# Patient Record
Sex: Male | Born: 1965 | Race: White | Hispanic: No | Marital: Single | Smoking: Former smoker
Health system: Southern US, Community
[De-identification: ages and names within clinical notes are randomized; demographics above are authoritative.]

## PROBLEM LIST (undated history)

## (undated) DIAGNOSIS — F32A Depression, unspecified: Secondary | ICD-10-CM

## (undated) DIAGNOSIS — F1010 Alcohol abuse, uncomplicated: Secondary | ICD-10-CM

## (undated) DIAGNOSIS — F329 Major depressive disorder, single episode, unspecified: Secondary | ICD-10-CM

## (undated) DIAGNOSIS — I4891 Unspecified atrial fibrillation: Secondary | ICD-10-CM

## (undated) DIAGNOSIS — I10 Essential (primary) hypertension: Secondary | ICD-10-CM

## (undated) DIAGNOSIS — I509 Heart failure, unspecified: Secondary | ICD-10-CM

## (undated) DIAGNOSIS — Z720 Tobacco use: Secondary | ICD-10-CM

## (undated) HISTORY — PX: CARDIOVERSION: SHX1299

## (undated) HISTORY — PX: ABLATION: SHX5711

## (undated) HISTORY — DX: Alcohol abuse, uncomplicated: F10.10

## (undated) HISTORY — DX: Major depressive disorder, single episode, unspecified: F32.9

## (undated) HISTORY — DX: Depression, unspecified: F32.A

## (undated) HISTORY — DX: Tobacco use: Z72.0

## (undated) HISTORY — DX: Heart failure, unspecified: I50.9

---

## 2015-07-10 DIAGNOSIS — F32A Depression, unspecified: Secondary | ICD-10-CM | POA: Insufficient documentation

## 2015-07-10 DIAGNOSIS — Z720 Tobacco use: Secondary | ICD-10-CM | POA: Insufficient documentation

## 2015-07-10 DIAGNOSIS — F1010 Alcohol abuse, uncomplicated: Secondary | ICD-10-CM | POA: Insufficient documentation

## 2015-07-15 ENCOUNTER — Emergency Department: Payer: BC Managed Care – PPO

## 2015-07-15 ENCOUNTER — Emergency Department
Admission: EM | Admit: 2015-07-15 | Discharge: 2015-07-15 | Payer: BC Managed Care – PPO | Attending: Emergency Medicine | Admitting: Emergency Medicine

## 2015-07-15 ENCOUNTER — Other Ambulatory Visit: Payer: Self-pay

## 2015-07-15 ENCOUNTER — Encounter: Payer: Self-pay | Admitting: Emergency Medicine

## 2015-07-15 DIAGNOSIS — I9589 Other hypotension: Secondary | ICD-10-CM | POA: Diagnosis not present

## 2015-07-15 DIAGNOSIS — I4891 Unspecified atrial fibrillation: Secondary | ICD-10-CM | POA: Diagnosis not present

## 2015-07-15 DIAGNOSIS — F17200 Nicotine dependence, unspecified, uncomplicated: Secondary | ICD-10-CM | POA: Insufficient documentation

## 2015-07-15 DIAGNOSIS — I313 Pericardial effusion (noninflammatory): Secondary | ICD-10-CM | POA: Insufficient documentation

## 2015-07-15 DIAGNOSIS — Z7902 Long term (current) use of antithrombotics/antiplatelets: Secondary | ICD-10-CM | POA: Insufficient documentation

## 2015-07-15 DIAGNOSIS — R079 Chest pain, unspecified: Secondary | ICD-10-CM | POA: Diagnosis present

## 2015-07-15 DIAGNOSIS — I3139 Other pericardial effusion (noninflammatory): Secondary | ICD-10-CM

## 2015-07-15 DIAGNOSIS — Z79899 Other long term (current) drug therapy: Secondary | ICD-10-CM | POA: Insufficient documentation

## 2015-07-15 HISTORY — DX: Unspecified atrial fibrillation: I48.91

## 2015-07-15 LAB — CBC WITH DIFFERENTIAL/PLATELET
Basophils Absolute: 0 10*3/uL (ref 0–0.1)
Basophils Relative: 0 %
Eosinophils Absolute: 0 10*3/uL (ref 0–0.7)
Eosinophils Relative: 1 %
HCT: 41.8 % (ref 40.0–52.0)
Hemoglobin: 14.1 g/dL (ref 13.0–18.0)
Lymphocytes Relative: 25 %
Lymphs Abs: 1.9 10*3/uL (ref 1.0–3.6)
MCH: 33.1 pg (ref 26.0–34.0)
MCHC: 33.6 g/dL (ref 32.0–36.0)
MCV: 98.5 fL (ref 80.0–100.0)
Monocytes Absolute: 0.6 10*3/uL (ref 0.2–1.0)
Monocytes Relative: 8 %
Neutro Abs: 4.9 10*3/uL (ref 1.4–6.5)
Neutrophils Relative %: 66 %
Platelets: 212 10*3/uL (ref 150–440)
RBC: 4.25 MIL/uL — ABNORMAL LOW (ref 4.40–5.90)
RDW: 14.1 % (ref 11.5–14.5)
WBC: 7.5 10*3/uL (ref 3.8–10.6)

## 2015-07-15 LAB — COMPREHENSIVE METABOLIC PANEL
ALT: 56 U/L (ref 17–63)
AST: 57 U/L — ABNORMAL HIGH (ref 15–41)
Albumin: 3.7 g/dL (ref 3.5–5.0)
Alkaline Phosphatase: 51 U/L (ref 38–126)
Anion gap: 9 (ref 5–15)
BUN: 18 mg/dL (ref 6–20)
CO2: 23 mmol/L (ref 22–32)
Calcium: 8.3 mg/dL — ABNORMAL LOW (ref 8.9–10.3)
Chloride: 104 mmol/L (ref 101–111)
Creatinine, Ser: 1.57 mg/dL — ABNORMAL HIGH (ref 0.61–1.24)
GFR calc Af Amer: 59 mL/min — ABNORMAL LOW (ref >60)
GFR calc non Af Amer: 51 mL/min — ABNORMAL LOW (ref >60)
Glucose, Bld: 137 mg/dL — ABNORMAL HIGH (ref 65–99)
Potassium: 4.6 mmol/L (ref 3.5–5.1)
Sodium: 136 mmol/L (ref 135–145)
Total Bilirubin: 0.6 mg/dL (ref 0.3–1.2)
Total Protein: 6.8 g/dL (ref 6.5–8.1)

## 2015-07-15 LAB — TROPONIN I: Troponin I: <0.03 ng/mL (ref <0.031)

## 2015-07-15 MED ORDER — SODIUM CHLORIDE 0.9 % IV BOLUS (SEPSIS)
1000.0000 mL | Freq: Once | INTRAVENOUS | Status: AC
  Administered 2015-07-15: 1000 mL via INTRAVENOUS

## 2015-07-15 MED ORDER — NOREPINEPHRINE BITARTRATE 1 MG/ML IV SOLN
0.0000 ug/min | Freq: Once | INTRAVENOUS | Status: DC
  Filled 2015-07-15: qty 4

## 2015-07-15 NOTE — ED Notes (Addendum)
Pt states 'i'm having a fib". Pt diaphoretic pale. Pt denies pain, pt states was seen at [facility] in [place] on Monday and diagnosed with a fib and "high heart enzymes". resps unlabored.

## 2015-07-15 NOTE — ED Notes (Addendum)
Spoke with vera from [facility]; no air travel due to the weather. Room [place] tower 7th floor assigned; will call nurse's station with report. Critical care transport will call when en route.  Pt placed on 2L O2 per Dr. Cinda Quest.

## 2015-07-15 NOTE — ED Provider Notes (Signed)
-----------------------------------------   10:28 AM on 07/15/2015 -----------------------------------------  I accepted signout from Dr. Rip Harbour with patient already accepted at [facility] and transportation in route. Patient has remained stable in the emergency department. There were concerns that he may need to be started on levo phed. His blood pressure has improved little bit and he has been stable and this medication was not started. At this time, I reviewed the case with the transport nurse from [facility] reviewed his chest x-ray as well, which shows a slightly round-appearing cardiacs follow-up.  Ahmed Prima, MD 07/15/15 [phone]

## 2015-07-15 NOTE — ED Provider Notes (Addendum)
[facility] Emergency Department Provider Note  ____________________________________________  Time seen: Approximately 7:51 AM  I have reviewed the triage vital signs and the nursing notes.   HISTORY  Chief Complaint Atrial Fibrillation    HPI Derrick Murphy is a 49 y.o. male who reports that last week he had atrial fibrillation new onset he was seen at [facility] he was defibrillated and then put on Eliquis. He had a bump in his troponin and because of the crushing chest pain he had experienced it was thought he might have had a small heart attack he said he was told. He was discharged from there and felt well until this morning he had an episode of feeling pale cold and sweaty and lightheaded. He came into the emergency room. In the emergency room EKG showed a flutter with ventricular rate of about 60 or 61. EKG was reviewed by myself and the other ER doctor on duty we did not feel there was any acute MI at that time. Second EKG was done which showed no acute MI. Patient's blood pressure remained low. Patient denied headache nausea vomiting diarrhea chest pain belly pain dysuria or any other complaints nothing was hurting him. He just felt lightheaded and very weak. Patient received IV fluids for his hypotension blood pressure remained in the 80s dropping occasionally down into the 70s and sometimes going up to the 90s. Chest x-ray looked normal except for cardiomegaly electrolytes came back looking normal white blood count was normal he is not anemic. His troponin is negative. Bedside ultrasound with her sinusitis in the ER shows a pericardial effusion. I looked at this bedside echo with her sinusitis Dr. Thomasene Lot. Both of [place] are suspicious that the right ventricle may not be expanding properly. This would imply that it Cannot may be developing. Neither one of [place] her experts in this though. I spoke with Dr. Vincente Poli cardiologist who advised transfer. I then spoke  to the cardiologist at [facility] has recommended I speak to the status I was not sure that this was temp not. The hospitalist referred me to the intensivist. The intensivist said that she get the cardiologist here or the intensivist here to see him negative new or one of them are in the hospital at that time period and the patient is still hypotensive in the 70s. I have called [facility] and they have accepted the patient.  Past Medical History  Diagnosis Date  . Atrial fibrillation ([place])     There are no active problems to display for this patient.   History reviewed. No pertinent past surgical history.  Current Outpatient Rx  Name  Route  Sig  Dispense  Refill  . ALPRAZolam (XANAX) 0.5 MG tablet   Oral   Take 0.5 mg by mouth 2 (two) times daily as needed for anxiety.         Marland Kitchen amiodarone (PACERONE) 400 MG tablet   Oral   Take 400 mg by mouth 2 (two) times daily.         Marland Kitchen apixaban (ELIQUIS) 5 MG TABS tablet   Oral   Take 5 mg by mouth 2 (two) times daily.         . magnesium oxide (MAG-OX) 400 MG tablet   Oral   Take 400 mg by mouth daily.         . metoprolol succinate (TOPROL-XL) 50 MG 24 hr tablet   Oral   Take 50 mg by mouth daily. Take with  or immediately following a meal.         . tamsulosin (FLOMAX) 0.4 MG CAPS capsule   Oral   Take 0.4 mg by mouth.         . venlafaxine XR (EFFEXOR-XR) 150 MG 24 hr capsule   Oral   Take 150 mg by mouth daily with breakfast.           Allergies Review of patient's allergies indicates no known allergies.  History reviewed. No pertinent family history.  Social History Social History  Substance Use Topics  . Smoking status: Current Every Day Smoker  . Smokeless tobacco: None  . Alcohol Use: Yes    Review of Systems Constitutional: No fever/chills Eyes: No visual changes. ENT: No sore throat. Cardiovascular: Denies chest pain. Respiratory: Denies shortness of breath. Gastrointestinal: No abdominal pain.  No  nausea, no vomiting.  No diarrhea.  No constipation. Genitourinary: Negative for dysuria. Musculoskeletal: Negative for back pain. Skin: Negative for rash. Neurological: Negative for headaches, focal weakness or numbness.  10-point ROS otherwise negative.  ____________________________________________   PHYSICAL EXAM:  VITAL SIGNS: ED Triage Vitals  Enc Vitals Group     BP 07/15/15 0607 88/49 mmHg     Pulse Rate 07/15/15 0604 60     Resp 07/15/15 0604 14     Temp 07/15/15 0604 98 F (36.7 C)     Temp Source 07/15/15 0604 Oral     SpO2 07/15/15 0604 98 %     Weight 07/15/15 0604 240 lb (108.863 kg)     Height 07/15/15 0604 5\' 8"  (1.727 m)     Head Cir --      Peak Flow --      Pain Score --      Pain Loc --      Pain Edu? --      Excl. in [place]? --     Constitutional: Alert and oriented. Pale and sweaty gentleman Eyes: Conjunctivae are normal. PERRL. EOMI. Head: Atraumatic. Nose: No congestion/rhinnorhea. Mouth/Throat: Mucous membranes are moist.  Oropharynx non-erythematous. Neck: No stridor.  Cardiovascular: Normal rate, regular rhythm. Grossly normal heart sounds.  Good peripheral circulation. Respiratory: Normal respiratory effort.  No retractions. Lungs CTAB. Gastrointestinal: Soft and nontender. No distention. No abdominal bruits. No CVA tenderness. Musculoskeletal: No lower extremity tenderness nor edema.  No joint effusions. Neurologic:  Normal speech and language. No gross focal neurologic deficits are appreciated. No gait instability. Skin:  Skin is pale and sweaty. No rash noted. Psychiatric: Mood and affect are normal. Speech and behavior are normal. Patient says he feels fine and wants to go out and eat  ____________________________________________   LABS (all labs ordered are listed, but only abnormal results are displayed)  Labs Reviewed  CBC WITH DIFFERENTIAL/PLATELET - Abnormal; Notable for the following:    RBC 4.25 (*)    All other components  within normal limits  COMPREHENSIVE METABOLIC PANEL - Abnormal; Notable for the following:    Glucose, Bld 137 (*)    Creatinine, Ser 1.57 (*)    Calcium 8.3 (*)    AST 57 (*)    GFR calc non Af Amer 51 (*)    GFR calc Af Amer 59 (*)    All other components within normal limits  TROPONIN I   ____________________________________________  EKG  EKG shows a ventricular rate of 61 with a flutter about a 4-1 conduction rate normal axis no obvious acute ST-T wave changes although the a flutter is so rapid it's  difficult to tell EKG #2 also read and interpreted by me shows a flutter with a 4-1 block the machine is reading a left bundle branch block I do not believe he has left bundle branch block although the QRS is widened slightly I do not see any acute ST-T wave changes by 8:00 patient is feeling well and again wants to go eat. His episode of pale and sweatiness only lasted for about 15 minutes. Checked our intensivist is not in-house I nor cardiologist on-call is not in house [facility] doesn't have any beds at this point I'm going to talk to the ER doctor and see if I can have him transferred to the ER [facility] ____________________________________________  RADIOLOGY  Chest x-ray reviewed by me and read by radiologist shows cardiomegaly ____________________________________________   PROCEDURES Blood pressure checked in both arms and with a manual cuff it is consistent in all 3 ways of treating the blood pressure.  ----------------------------------------- 8:48 AM on 07/15/2015 -----------------------------------------  At this time [facility] is trying to make a bed and call me back or send the patient to the ER I have also talked to [facility] also accepted him and they're trying to find a way to get him there and an expeditious manner. Most coming back and tell me they can fly this gentleman this gentleman was blood pressure has now gone up to 92/70 she is the highest it ever been since he's been here  today. The care everywhere has appeared and there is an echo from last week which shows no effusion continue administering fluids patient's had to hold liters and is working on the next 2 L about halfway through with the third liter and the fourth liters just starting at this point ____________________________________________   [place] / [place] / ED COURSE  Pertinent labs & imaging results that were available during my care of the patient were reviewed by me and considered in my medical decision making (see chart for details).   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  ----------------------------------------- 9:04 AM on 07/15/2015 -----------------------------------------  Patient at this point has gotten proximally 3 L of fluid. Blood pressures up to 98 systolic. We will keep the epinephrine on standby. We had not started it yet  ----------------------------------------- 9:32 AM on 07/15/2015 -----------------------------------------  [facility] is called they are approximately an hour out. Patient's blood pressure now that the fourth and fifth liters have been finished for maybe 15 minutes is 88 systolic. Patient is sleeping when he sleeps O2 sats fall to 88. When he wakes up when I talk to his sats go up to 98 again rapidly. He does not appear to be in any distress. He does not have any symptoms of congestive failure. I will start 2 more liters of fluid and run them slightly more slowly in an attempt to avoid giving levothyroid just in case he levothyroid will worsen his perfusion by raising his afterload. The levothyroid however is in the room  and if needed we will started 0.1 mics per kilo per minute and go up from there. Dr. Thomasene Lot has been here with me since 8 AM and is fully aware the patient. He will monitor the patient such time  as [facility] arrives. I will go home now so as not to fall asleep while driving home.  Nena Polio, MD 07/15/15 [phone]  Critical care time is 2 hours this includes talking to 2 of our ER doctors Dr. Vincente Poli cardiologist the [facility] Pontes cardiologist the [facility] list the Martyn Malay critical care doctor the [facility] transfer center and critical care doctor in the [facility] Transfer [facility]., [facility] fellow and attending for cardiology and several other calls to the [place] transfer center as well as the [facility] transfer center and repeated updates to the patient and his mother in the room.  Nena Polio, MD 07/15/15 [phone]  Please note the dictation of the history and physical in the last 2 sense that should say the bedside ultrasound showed a pericardial effusion. Dr. Thomasene Lot and I felt that the right ventricle was not expanding properly. This would seem to indicate a developing pericardial tamponade. I called the cardiologist on call Dr. Fletcher Anon. He advised transfer. I spoke to the cardiologist at [facility]. He said since I wasn't sure he would recommend transferring the patient to the hospitalist. Spoke to the hospitalist. The hospitalist said the patient sounded sick enough to talk to the intensivist. Ice called the intensivist. Intensivist said since I wasn't sure I should probably get our intensivist to see the patient. Neither are intensivist nor R cardiologist are in-house at this point. They will not be here for some time. I therefore called [facility] the patient was accepted there immediately. Unfortunately [facility] had no beds. Therefore I called [facility]. The patient was accepted there immediately as well.  Nena Polio, MD 07/15/15 1017  Lee's note this note was dictated on Dragon. There are many Dragon  mistakes and it please excuse them thank you  Nena Polio, MD 07/15/15 1019

## 2015-07-15 NOTE — ED Notes (Signed)
Pt given urinal; wanted to stand up but was convinced to stay in bed.

## 2015-07-16 DIAGNOSIS — I483 Typical atrial flutter: Secondary | ICD-10-CM | POA: Insufficient documentation

## 2015-08-28 ENCOUNTER — Emergency Department (HOSPITAL_COMMUNITY)
Admission: EM | Admit: 2015-08-28 | Discharge: 2015-08-28 | Disposition: A | Discharge status: Home / Self Care | Payer: BC Managed Care – PPO | Attending: Emergency Medicine | Admitting: Emergency Medicine

## 2015-08-28 DIAGNOSIS — F17200 Nicotine dependence, unspecified, uncomplicated: Secondary | ICD-10-CM | POA: Diagnosis not present

## 2015-08-28 DIAGNOSIS — R110 Nausea: Secondary | ICD-10-CM | POA: Diagnosis not present

## 2015-08-28 DIAGNOSIS — I4891 Unspecified atrial fibrillation: Secondary | ICD-10-CM | POA: Diagnosis not present

## 2015-08-28 DIAGNOSIS — Z79899 Other long term (current) drug therapy: Secondary | ICD-10-CM | POA: Insufficient documentation

## 2015-08-28 DIAGNOSIS — Z7901 Long term (current) use of anticoagulants: Secondary | ICD-10-CM | POA: Diagnosis not present

## 2015-08-28 DIAGNOSIS — R42 Dizziness and giddiness: Secondary | ICD-10-CM | POA: Diagnosis not present

## 2015-08-28 DIAGNOSIS — R55 Syncope and collapse: Secondary | ICD-10-CM | POA: Diagnosis present

## 2015-08-28 LAB — I-STAT CHEM 8, ED
BUN: 18 mg/dL (ref 6–20)
Calcium, Ion: 1.06 mmol/L — ABNORMAL LOW (ref 1.12–1.23)
Chloride: 100 mmol/L — ABNORMAL LOW (ref 101–111)
Creatinine, Ser: 1.3 mg/dL — ABNORMAL HIGH (ref 0.61–1.24)
Glucose, Bld: 93 mg/dL (ref 65–99)
HCT: 48 % (ref 39.0–52.0)
Hemoglobin: 16.3 g/dL (ref 13.0–17.0)
Potassium: 4.3 mmol/L (ref 3.5–5.1)
Sodium: 139 mmol/L (ref 135–145)
TCO2: 25 mmol/L (ref 0–100)

## 2015-08-28 NOTE — ED Provider Notes (Signed)
CSN: [id number]     Arrival date & time 08/28/15  1440 History   First MD Initiated Contact with Patient 08/28/15 1505     Chief Complaint  Patient presents with  . Near Syncope   (Consider location/radiation/quality/duration/timing/severity/associated sxs/prior Treatment) HPI 50 y.o. male with a hx of Afib/Aflutter, presents to the Emergency Department today complaining of a near syncopal episode this morning. Pt was at [facility] for detox when he started to stand up from a seated position. At that moment, the patient felt light headed/dizzy with nausea. Decided to sit down and the symptoms passed within 15-20 seconds. The detox facility notified EMS and wished him to be cleared medically before being returned. Pt states that he has been seen at both [facility] and [facility] for his Afib/Aflutter as well as previous near-syncope work-ups. Has been cardioverted x3, but were all unsuccessful. Pt is on Elquis as well as Amiodarone, Metoprolol and Magnesium. He was scheduled to see Dr. Anson Fret in [place] for possible ablation on Feb 8th, but the patient was in Detox and they decided to reschedule for March 8th. Pt exhibiting no other symptoms of CP/SOB/ABD pain. No N/V/D. No headaches/vision changes. No other symptoms noted.  Past Medical History  Diagnosis Date  . Atrial fibrillation ([place])    No past surgical history on file. No family history on file. Social History  Substance Use Topics  . Smoking status: Current Every Day Smoker  . Smokeless tobacco: Not on file  . Alcohol Use: Yes    Review of Systems ROS reviewed and all are negative for acute change except as noted in the HPI.  Allergies  Review of patient's allergies indicates no known allergies.  Home Medications   Prior to Admission medications   Medication Sig Start Date End Date Taking? Authorizing Provider  ALPRAZolam Duanne Moron) 0.5 MG tablet Take 0.5 mg by mouth 2 (two) times daily as needed for anxiety.   Yes  Historical Provider, MD  amiodarone (PACERONE) 400 MG tablet Take 400 mg by mouth 2 (two) times daily.   Yes Historical Provider, MD  apixaban (ELIQUIS) 5 MG TABS tablet Take 5 mg by mouth 2 (two) times daily.   Yes Historical Provider, MD  CHLORDIAZEPOXIDE HCL PO Take 1 Dose by mouth once.   Yes Historical Provider, MD  magnesium oxide (MAG-OX) 400 MG tablet Take 400 mg by mouth 2 (two) times daily.    Yes Historical Provider, MD  metoprolol (LOPRESSOR) 50 MG tablet Take 50 mg by mouth 2 (two) times daily.   Yes Historical Provider, MD  Multiple Vitamin (MULTIVITAMIN WITH MINERALS) TABS tablet Take 1 tablet by mouth daily.   Yes Historical Provider, MD  tamsulosin (FLOMAX) 0.4 MG CAPS capsule Take 0.4 mg by mouth.   Yes Historical Provider, MD  thiamine 100 MG tablet Take 100 mg by mouth daily.   Yes Historical Provider, MD  traZODone (DESYREL) 50 MG tablet Take 50 mg by mouth at bedtime as needed for sleep.   Yes Historical Provider, MD  venlafaxine XR (EFFEXOR-XR) 150 MG 24 hr capsule Take 150 mg by mouth daily with breakfast.   Yes Historical Provider, MD   BP 127/79 mmHg  Pulse 76  Temp(Src) 98.5 F (36.9 C) (Oral)  Resp 15  Ht 5\' 8"  (1.727 m)  Wt 117.028 kg  BMI 39.24 kg/m2  SpO2 96%   Physical Exam  Constitutional: He is oriented to person, place, and time. He appears well-developed and well-nourished.  HENT:  Head:  Normocephalic and atraumatic.  Eyes: EOM are normal.  Neck: Normal range of motion. Neck supple.  Cardiovascular: Normal rate, regular rhythm and normal heart sounds.   Pulmonary/Chest: Effort normal and breath sounds normal.  Abdominal: Soft.  Musculoskeletal: Normal range of motion.  Neurological: He is alert and oriented to person, place, and time.  Skin: Skin is warm and dry.  Psychiatric: He has a normal mood and affect. His behavior is normal. Thought content normal.  Nursing note and vitals reviewed.  ED Course  Procedures (including critical care  time) Labs Review Labs Reviewed  I-STAT CHEM 8, ED - Abnormal; Notable for the following:    Chloride 100 (*)    Creatinine, Ser 1.30 (*)    Calcium, Ion 1.06 (*)    All other components within normal limits    Imaging Review No results found. I have personally reviewed and evaluated these images and lab results as part of my medical decision-making.   EKG Interpretation   Date/Time:  Monday August 28 2015 14:41:40 EST Ventricular Rate:  79 PR Interval:    QRS Duration: 127 QT Interval:  439 QTC Calculation: 503 R Axis:   3 Text Interpretation:  Atrial fibrillation v atrial flutter Nonspecific  intraventricular conduction delay No significant change since last tracing  Confirmed by [facility]  MD, MARTHA [id number]) on 08/28/2015 3:13:19 PM      MDM  I have reviewed relevant laboratory values.I have reviewed relevant imaging studies.I personally interpreted the relevant EKG.I have reviewed the relevant previous healthcare records.I have reviewed EMS Documentation.I obtained HPI from historian. Patient discussed with supervising physician  ED Course:  Assessment: 33y Afib/Aflutter presents to ED after near syncopal episode at [facility]. Pt has been seen and "worked up" at [facility] and [facility] for similar episodes. Scheduled to see Cardiologist on March 8th for possible ablation. Pt is well appearing. In NAD. VSS. He is on Eliquis. Is Afib is w/o RVR. Last seen by Cardiologist at [facility] on 08-11-15. Will check iStat Chem 8 for electrolyte abnormalities as well as EKG. If unremarkable, will DC with follow up to Cardiologist for possible ablation. I spoke with [facility] and they had DCed the patient due to his medical instability and will not take him back. Patient is in no acute distress. Vital Signs are stable. Patient is able to ambulate. Patient able to tolerate PO.    Disposition/Plan:  DC Home Additional Verbal discharge instructions given and discussed  with patient.  Pt Instructed to f/u with scheduled appointment with Cardiologist  Return precautions given Pt acknowledges and agrees with plan  Supervising Physician Alfonzo Beers, MD   Final diagnoses:  Dizziness     Shary Decamp, PA-C 08/28/15 [place], MD 08/29/15 [phone]

## 2015-08-28 NOTE — ED Notes (Signed)
Patient had a near syncopal episode this AM. States that it happened at 10 AM.  Denies fall, states that he sat in a chair.  Patient from [facility].

## 2015-08-28 NOTE — Discharge Instructions (Signed)
Please read and follow all provided instructions.  Your diagnoses today include:  1. Dizziness     Tests performed today include:  Vital signs. See below for your results today.   Medications prescribed:   None   Home care instructions:  Follow any educational materials contained in this packet.  Follow-up instructions: Please follow-up with your Cardiologist for possible Ablation.    Return instructions:   Please return to the Emergency Department if you do not get better, if you get worse, or new symptoms OR  - Fever (temperature greater than 101.44F)  - Bleeding that does not stop with holding pressure to the area    -Severe pain (please note that you may be more sore the day after your accident)  - Chest Pain  - Difficulty breathing  - Severe nausea or vomiting  - Inability to tolerate food and liquids  - Passing out  - Skin becoming red around your wounds  - Change in mental status (confusion or lethargy)  - New numbness or weakness     Please return if you have any other emergent concerns.  Additional Information:  Your vital signs today were: BP 136/83 mmHg   Pulse 69   Temp(Src) 98.5 F (36.9 C) (Oral)   Resp 14   Ht 5\' 8"  (1.727 m)   Wt 117.028 kg   BMI 39.24 kg/m2   SpO2 97% If your blood pressure (BP) was elevated above 135/85 this visit, please have this repeated by your doctor within one month. ---------------

## 2015-08-28 NOTE — Progress Notes (Signed)
CSW provided pt with taxi voucher transportation to his mother's home in [place].  Pt encouraged to contact his insurance company for options re: treatment  facilities that may be able to accept him with his medical condition.  Pt appreciative of CSW assistance with transportation.

## 2015-10-19 DIAGNOSIS — R002 Palpitations: Secondary | ICD-10-CM | POA: Insufficient documentation

## 2015-10-20 ENCOUNTER — Emergency Department
Admission: EM | Admit: 2015-10-20 | Discharge: 2015-10-20 | Disposition: A | Discharge status: Home / Self Care | Payer: BC Managed Care – PPO | Attending: Emergency Medicine | Admitting: Emergency Medicine

## 2015-10-20 ENCOUNTER — Emergency Department: Payer: BC Managed Care – PPO

## 2015-10-20 ENCOUNTER — Encounter: Payer: Self-pay | Admitting: Emergency Medicine

## 2015-10-20 DIAGNOSIS — I499 Cardiac arrhythmia, unspecified: Secondary | ICD-10-CM | POA: Insufficient documentation

## 2015-10-20 DIAGNOSIS — S0990XA Unspecified injury of head, initial encounter: Secondary | ICD-10-CM | POA: Insufficient documentation

## 2015-10-20 DIAGNOSIS — Y9289 Other specified places as the place of occurrence of the external cause: Secondary | ICD-10-CM | POA: Insufficient documentation

## 2015-10-20 DIAGNOSIS — F17200 Nicotine dependence, unspecified, uncomplicated: Secondary | ICD-10-CM | POA: Insufficient documentation

## 2015-10-20 DIAGNOSIS — Y9389 Activity, other specified: Secondary | ICD-10-CM | POA: Diagnosis not present

## 2015-10-20 DIAGNOSIS — S01111A Laceration without foreign body of right eyelid and periocular area, initial encounter: Secondary | ICD-10-CM | POA: Diagnosis not present

## 2015-10-20 DIAGNOSIS — S301XXA Contusion of abdominal wall, initial encounter: Secondary | ICD-10-CM | POA: Insufficient documentation

## 2015-10-20 DIAGNOSIS — R55 Syncope and collapse: Secondary | ICD-10-CM | POA: Insufficient documentation

## 2015-10-20 DIAGNOSIS — F1310 Sedative, hypnotic or anxiolytic abuse, uncomplicated: Secondary | ICD-10-CM | POA: Insufficient documentation

## 2015-10-20 DIAGNOSIS — F1010 Alcohol abuse, uncomplicated: Secondary | ICD-10-CM | POA: Diagnosis not present

## 2015-10-20 DIAGNOSIS — W1839XA Other fall on same level, initial encounter: Secondary | ICD-10-CM | POA: Insufficient documentation

## 2015-10-20 DIAGNOSIS — IMO0002 Reserved for concepts with insufficient information to code with codable children: Secondary | ICD-10-CM

## 2015-10-20 DIAGNOSIS — Y998 Other external cause status: Secondary | ICD-10-CM | POA: Insufficient documentation

## 2015-10-20 DIAGNOSIS — S20211A Contusion of right front wall of thorax, initial encounter: Secondary | ICD-10-CM | POA: Diagnosis not present

## 2015-10-20 LAB — COMPREHENSIVE METABOLIC PANEL
ALT: 40 U/L (ref 17–63)
AST: 45 U/L — ABNORMAL HIGH (ref 15–41)
Albumin: 3.9 g/dL (ref 3.5–5.0)
Alkaline Phosphatase: 58 U/L (ref 38–126)
Anion gap: 10 (ref 5–15)
BUN: 14 mg/dL (ref 6–20)
CO2: 22 mmol/L (ref 22–32)
Calcium: 7.5 mg/dL — ABNORMAL LOW (ref 8.9–10.3)
Chloride: 103 mmol/L (ref 101–111)
Creatinine, Ser: 1.06 mg/dL (ref 0.61–1.24)
GFR calc Af Amer: >60 mL/min (ref >60)
GFR calc non Af Amer: >60 mL/min (ref >60)
Glucose, Bld: 100 mg/dL — ABNORMAL HIGH (ref 65–99)
Potassium: 3.9 mmol/L (ref 3.5–5.1)
Sodium: 135 mmol/L (ref 135–145)
Total Bilirubin: 0.5 mg/dL (ref 0.3–1.2)
Total Protein: 6.7 g/dL (ref 6.5–8.1)

## 2015-10-20 LAB — URINALYSIS COMPLETE WITH MICROSCOPIC (ARMC ONLY)
Bacteria, UA: NONE SEEN
Bilirubin Urine: NEGATIVE
Glucose, UA: NEGATIVE mg/dL
Hgb urine dipstick: NEGATIVE
Ketones, ur: NEGATIVE mg/dL
Leukocytes, UA: NEGATIVE
Nitrite: NEGATIVE
Protein, ur: NEGATIVE mg/dL
RBC / HPF: NONE SEEN RBC/hpf (ref 0–5)
Specific Gravity, Urine: 1.01 (ref 1.005–1.030)
pH: 5 (ref 5.0–8.0)

## 2015-10-20 LAB — URINE DRUG SCREEN, QUALITATIVE (ARMC ONLY)
Amphetamines, Ur Screen: NOT DETECTED
Barbiturates, Ur Screen: NOT DETECTED
Benzodiazepine, Ur Scrn: POSITIVE — AB
Cannabinoid 50 Ng, Ur ~~LOC~~: NOT DETECTED
Cocaine Metabolite,Ur ~~LOC~~: NOT DETECTED
MDMA (Ecstasy)Ur Screen: NOT DETECTED
Methadone Scn, Ur: NOT DETECTED
Opiate, Ur Screen: NOT DETECTED
Phencyclidine (PCP) Ur S: NOT DETECTED
Tricyclic, Ur Screen: NOT DETECTED

## 2015-10-20 LAB — CBC WITH DIFFERENTIAL/PLATELET
Basophils Absolute: 0.1 10*3/uL (ref 0–0.1)
Basophils Relative: 1 %
Eosinophils Absolute: 0 10*3/uL (ref 0–0.7)
Eosinophils Relative: 1 %
HCT: 39.5 % — ABNORMAL LOW (ref 40.0–52.0)
Hemoglobin: 13.5 g/dL (ref 13.0–18.0)
Lymphocytes Relative: 18 %
Lymphs Abs: 1.2 10*3/uL (ref 1.0–3.6)
MCH: 33.1 pg (ref 26.0–34.0)
MCHC: 34.3 g/dL (ref 32.0–36.0)
MCV: 96.5 fL (ref 80.0–100.0)
Monocytes Absolute: 0.7 10*3/uL (ref 0.2–1.0)
Monocytes Relative: 10 %
Neutro Abs: 4.7 10*3/uL (ref 1.4–6.5)
Neutrophils Relative %: 70 %
Platelets: 186 10*3/uL (ref 150–440)
RBC: 4.09 MIL/uL — ABNORMAL LOW (ref 4.40–5.90)
RDW: 15.4 % — ABNORMAL HIGH (ref 11.5–14.5)
WBC: 6.6 10*3/uL (ref 3.8–10.6)

## 2015-10-20 LAB — PROTIME-INR
INR: 1.23
Prothrombin Time: 15.7 seconds — ABNORMAL HIGH (ref 11.4–15.0)

## 2015-10-20 LAB — ETHANOL: Alcohol, Ethyl (B): 276 mg/dL — ABNORMAL HIGH (ref <5)

## 2015-10-20 LAB — TROPONIN I: Troponin I: <0.03 ng/mL (ref <0.031)

## 2015-10-20 MED ORDER — SODIUM CHLORIDE 0.9 % IV SOLN
1000.0000 mL | Freq: Once | INTRAVENOUS | Status: AC
  Administered 2015-10-20: 1000 mL via INTRAVENOUS

## 2015-10-20 MED ORDER — LIDOCAINE-EPINEPHRINE (PF) 1 %-1:200000 IJ SOLN
INTRAMUSCULAR | Status: AC
  Administered 2015-10-20: 10:00:00
  Filled 2015-10-20: qty 30

## 2015-10-20 NOTE — Discharge Instructions (Signed)
Alcohol Use Disorder Alcohol use disorder is a mental disorder. It is not a one-time incident of heavy drinking. Alcohol use disorder is the excessive and uncontrollable use of alcohol over time that leads to problems with functioning in one or more areas of daily living. People with this disorder risk harming themselves and others when they drink to excess. Alcohol use disorder also can cause other mental disorders, such as mood and anxiety disorders, and serious physical problems. People with alcohol use disorder often misuse other drugs.  Alcohol use disorder is common and widespread. Some people with this disorder drink alcohol to cope with or escape from negative life events. Others drink to relieve chronic pain or symptoms of mental illness. People with a family history of alcohol use disorder are at higher risk of losing control and using alcohol to excess.  Drinking too much alcohol can cause injury, accidents, and health problems. One drink can be too much when you are:  Working.  Pregnant or breastfeeding.  Taking medicines. Ask your doctor.  Driving or planning to drive. SYMPTOMS  Signs and symptoms of alcohol use disorder may include the following:   Consumption ofalcohol inlarger amounts or over a longer period of time than intended.  Multiple unsuccessful attempts to cutdown or control alcohol use.   A great deal of time spent obtaining alcohol, using alcohol, or recovering from the effects of alcohol (hangover).  A strong desire or urge to use alcohol (cravings).   Continued use of alcohol despite problems at work, school, or home because of alcohol use.   Continued use of alcohol despite problems in relationships because of alcohol use.  Continued use of alcohol in situations when it is physically hazardous, such as driving a car.  Continued use of alcohol despite awareness of a physical or psychological problem that is likely related to alcohol use. Physical  problems related to alcohol use can involve the brain, heart, liver, stomach, and intestines. Psychological problems related to alcohol use include intoxication, depression, anxiety, psychosis, delirium, and dementia.   The need for increased amounts of alcohol to achieve the same desired effect, or a decreased effect from the consumption of the same amount of alcohol (tolerance).  Withdrawal symptoms upon reducing or stopping alcohol use, or alcohol use to reduce or avoid withdrawal symptoms. Withdrawal symptoms include:  Racing heart.  Hand tremor.  Difficulty sleeping.  Nausea.  Vomiting.  Hallucinations.  Restlessness.  Seizures. DIAGNOSIS Alcohol use disorder is diagnosed through an assessment by your health care provider. Your health care provider may start by asking three or four questions to screen for excessive or problematic alcohol use. To confirm a diagnosis of alcohol use disorder, at least two symptoms must be present within a 68-month period. The severity of alcohol use disorder depends on the number of symptoms:  Mild--two or three.  Moderate--four or five.  Severe--six or more. Your health care provider may perform a physical exam or use results from lab tests to see if you have physical problems resulting from alcohol use. Your health care provider may refer you to a mental health professional for evaluation. TREATMENT  Some people with alcohol use disorder are able to reduce their alcohol use to low-risk levels. Some people with alcohol use disorder need to quit drinking alcohol. When necessary, mental health professionals with specialized training in substance use treatment can help. Your health care provider can help you decide how severe your alcohol use disorder is and what type of treatment you need.  The following forms of treatment are available:   Detoxification. Detoxification involves the use of prescription medicines to prevent alcohol withdrawal  symptoms in the first week after quitting. This is important for people with a history of symptoms of withdrawal and for heavy drinkers who are likely to have withdrawal symptoms. Alcohol withdrawal can be dangerous and, in severe cases, cause death. Detoxification is usually provided in a hospital or in-patient substance use treatment facility.  Counseling or talk therapy. Talk therapy is provided by substance use treatment counselors. It addresses the reasons people use alcohol and ways to keep them from drinking again. The goals of talk therapy are to help people with alcohol use disorder find healthy activities and ways to cope with life stress, to identify and avoid triggers for alcohol use, and to handle cravings, which can cause relapse.  Medicines.Different medicines can help treat alcohol use disorder through the following actions:  Decrease alcohol cravings.  Decrease the positive reward response felt from alcohol use.  Produce an uncomfortable physical reaction when alcohol is used (aversion therapy).  Support groups. Support groups are run by people who have quit drinking. They provide emotional support, advice, and guidance. These forms of treatment are often combined. Some people with alcohol use disorder benefit from intensive combination treatment provided by specialized substance use treatment centers. Both inpatient and outpatient treatment programs are available.   This information is not intended to replace advice given to you by your health care provider. Make sure you discuss any questions you have with your health care provider.   Document Released: 08/15/2004 Document Revised: 07/29/2014 Document Reviewed: 10/15/2012 Elsevier Interactive Patient Education 2016 [employer].  Near-Syncope Near-syncope (commonly known as near fainting) is sudden weakness, dizziness, or feeling like you might pass out. During an episode of near-syncope, you may also develop pale skin, have  tunnel vision, or feel sick to your stomach (nauseous). Near-syncope may occur when getting up after sitting or while standing for a long time. It is caused by a sudden decrease in blood flow to the brain. This decrease can result from various causes or triggers, most of which are not serious. However, because near-syncope can sometimes be a sign of something serious, a medical evaluation is required. The specific cause is often not determined. HOME CARE INSTRUCTIONS  Monitor your condition for any changes. The following actions may help to alleviate any discomfort you are experiencing:  Have someone stay with you until you feel stable.  Lie down right away and prop your feet up if you start feeling like you might faint. Breathe deeply and steadily. Wait until all the symptoms have passed. Most of these episodes last only a few minutes. You may feel tired for several hours.   Drink enough fluids to keep your urine clear or pale yellow.   If you are taking blood pressure or heart medicine, get up slowly when seated or lying down. Take several minutes to sit and then stand. This can reduce dizziness.  Follow up with your health care provider as directed. SEEK IMMEDIATE MEDICAL CARE IF:   You have a severe headache.   You have unusual pain in the chest, abdomen, or back.   You are bleeding from the mouth or rectum, or you have black or tarry stool.   You have an irregular or very fast heartbeat.   You have repeated fainting or have seizure-like jerking during an episode.   You faint when sitting or lying down.   You have  confusion.   You have difficulty walking.   You have severe weakness.   You have vision problems.  MAKE SURE YOU:   Understand these instructions.  Will watch your condition.  Will get help right away if you are not doing well or get worse.  Facial Laceration  A facial laceration is a cut on the face. These injuries can be painful and cause  bleeding. Lacerations usually heal quickly, but they need special care to reduce scarring. DIAGNOSIS  Your health care provider will take a medical history, ask for details about how the injury occurred, and examine the wound to determine how deep the cut is. TREATMENT  Some facial lacerations may not require closure. Others may not be able to be closed because of an increased risk of infection. The risk of infection and the chance for successful closure will depend on various factors, including the amount of time since the injury occurred. The wound may be cleaned to help prevent infection. If closure is appropriate, pain medicines may be given if needed. Your health care provider will use stitches (sutures), wound glue (adhesive), or skin adhesive strips to repair the laceration. These tools bring the skin edges together to allow for faster healing and a better cosmetic outcome. If needed, you may also be given a tetanus shot. HOME CARE INSTRUCTIONS  Only take over-the-counter or prescription medicines as directed by your health care provider.  Follow your health care provider's instructions for wound care. These instructions will vary depending on the technique used for closing the wound. For Sutures:  Keep the wound clean and dry.   If you were given a bandage (dressing), you should change it at least once a day. Also change the dressing if it becomes wet or dirty, or as directed by your health care provider.   Wash the wound with soap and water 2 times a day. Rinse the wound off with water to remove all soap. Pat the wound dry with a clean towel.   After cleaning, apply a thin layer of the antibiotic ointment recommended by your health care provider. This will help prevent infection and keep the dressing from sticking.   You may shower as usual after the first 24 hours. Do not soak the wound in water until the sutures are removed.   Get your sutures removed as directed by your health  care provider. With facial lacerations, sutures should usually be taken out after 4-5 days to avoid stitch marks.   Wait a few days after your sutures are removed before applying any makeup. For Skin Adhesive Strips:  Keep the wound clean and dry.   Do not get the skin adhesive strips wet. You may bathe carefully, using caution to keep the wound dry.   If the wound gets wet, pat it dry with a clean towel.   Skin adhesive strips will fall off on their own. You may trim the strips as the wound heals. Do not remove skin adhesive strips that are still stuck to the wound. They will fall off in time.  For Wound Adhesive:  You may briefly wet your wound in the shower or bath. Do not soak or scrub the wound. Do not swim. Avoid periods of heavy sweating until the skin adhesive has fallen off on its own. After showering or bathing, gently pat the wound dry with a clean towel.   Do not apply liquid medicine, cream medicine, ointment medicine, or makeup to your wound while the skin adhesive is  in place. This may loosen the film before your wound is healed.   If a dressing is placed over the wound, be careful not to apply tape directly over the skin adhesive. This may cause the adhesive to be pulled off before the wound is healed.   Avoid prolonged exposure to sunlight or tanning lamps while the skin adhesive is in place.  The skin adhesive will usually remain in place for 5-10 days, then naturally fall off the skin. Do not pick at the adhesive film.  After Healing: Once the wound has healed, cover the wound with sunscreen during the day for 1 full year. This can help minimize scarring. Exposure to ultraviolet light in the first year will darken the scar. It can take 1-2 years for the scar to lose its redness and to heal completely.  SEEK MEDICAL CARE IF:  You have a fever. SEEK IMMEDIATE MEDICAL CARE IF:  You have redness, pain, or swelling around the wound.   You see ayellowish-white  fluid (pus) coming from the wound.    This information is not intended to replace advice given to you by your health care provider. Make sure you discuss any questions you have with your health care provider.   Document Released: 08/15/2004 Document Revised: 07/29/2014 Document Reviewed: 02/18/2013 Elsevier Interactive Patient Education [employer].  This information is not intended to replace advice given to you by your health care provider. Make sure you discuss any questions you have with your health care provider.   Document Released: 07/08/2005 Document Revised: 07/13/2013 Document Reviewed: 12/11/2012 Elsevier Interactive Patient Education [employer].

## 2015-10-20 NOTE — ED Provider Notes (Addendum)
[facility] Emergency Department Provider Note     Time seen: ----------------------------------------- 9:02 AM on 10/20/2015 -----------------------------------------    I have reviewed the triage vital signs and the nursing notes.   HISTORY  Chief Complaint Loss of Consciousness    HPI Derrick Murphy is a 50 y.o. male who presents to the ERafter syncopal episode. Patient fell sustaining a laceration above his right eyebrow. He admits to alcohol use last night but denies any this morning. Patient states he also fell last night. Patient reports he has a history of syncope as well as rapid atrial fibrillation and atrial flutter. Patient complains of soreness above his right eyebrow and across his chest.   Past Medical History  Diagnosis Date  . Atrial fibrillation ([place])     There are no active problems to display for this patient.   No past surgical history on file.  Allergies Review of patient's allergies indicates no known allergies.  Social History Social History  Substance Use Topics  . Smoking status: Current Every Day Smoker  . Smokeless tobacco: Not on file  . Alcohol Use: Yes    Review of Systems Constitutional: Negative for fever. Eyes: Negative for visual changes. ENT: Negative for sore throat. Cardiovascular: Negative for chest pain. Respiratory: Negative for shortness of breath. Gastrointestinal: Negative for abdominal pain, vomiting and diarrhea. Genitourinary: Negative for dysuria. Musculoskeletal: Positive for chest wall pain Skin: Positive for laceration, bruising Neurological: Positive for headache  10-point ROS otherwise negative.  ____________________________________________   PHYSICAL EXAM:  VITAL SIGNS: ED Triage Vitals  Enc Vitals Group     BP --      Pulse --      Resp --      Temp --      Temp src --      SpO2 --      Weight --      Height --      Head Cir --      Peak Flow --      Pain Score  --      Pain Loc --      Pain Edu? --      Excl. in [place]? --     Constitutional: Alert and oriented. No distress Eyes: Conjunctivae are normal. PERRL. Normal extraocular movements. ENT   Head: Extensive right periorbital ecchymosis, supraorbital laceration on the right   Nose: No congestion/rhinnorhea.   Mouth/Throat: Mucous membranes are moist.   Neck: No stridor. Cardiovascular: Irregularly irregular rhythm Normal and symmetric distal pulses are present in all extremities. No murmurs, rubs, or gallops. Respiratory: Normal respiratory effort without tachypnea nor retractions. Breath sounds are clear and equal bilaterally. No wheezes/rales/rhonchi. Gastrointestinal: Soft and nontender. No distention. No abdominal bruits.  Musculoskeletal: Nontender with normal range of motion in all extremities.  Neurologic:  Normal speech and language. No gross focal neurologic deficits are appreciated. Speech is normal, he is unstable on his feet. Skin:  2.5 cm laceration as noted above the right eyebrow, ecchymosis over the right periorbital area, right lower chest wall, lower abdomen Psychiatric: Mood and affect are normal.  ____________________________________________  EKG: Interpreted by me. Sinus rhythm with a rate of 70 bpm, Ur screen AV block, normal QRS, long QT interval. Leftward axis.  ____________________________________________  ED COURSE:  Pertinent labs & imaging results that were available during my care of the patient were reviewed by me and considered in my medical decision making (see chart for details). Patient presents after falls at home  and possible syncope. There is also alcohol involved, he will need laceration repair, labs and imaging. LACERATION REPAIR Performed by: Earleen Newport Authorized by: Lenise Arena E Consent: Verbal consent obtained. Risks and benefits: risks, benefits and alternatives were discussed Consent given by: patient Patient  identity confirmed: provided demographic data Prepped and Draped in normal sterile fashion Wound explored  Laceration Location: Right supraorbital area  Laceration Length: 2.5 cm  No Foreign Bodies seen or palpated  Anesthesia: local infiltration  Local anesthetic: lidocaine 1 % with epinephrine  Anesthetic total: I ml  Irrigation method: syringe Amount of cleaning: standard  Skin closure: 5-0 Prolene   Number of sutures: 6   Technique: Running   Patient tolerance: Patient tolerated the procedure well with no immediate complications. ____________________________________________    LABS (pertinent positives/negatives)  Labs Reviewed  CBC WITH DIFFERENTIAL/PLATELET - Abnormal; Notable for the following:    RBC 4.09 (*)    HCT 39.5 (*)    RDW 15.4 (*)    All other components within normal limits  COMPREHENSIVE METABOLIC PANEL - Abnormal; Notable for the following:    Glucose, Bld 100 (*)    Calcium 7.5 (*)    AST 45 (*)    All other components within normal limits  URINALYSIS COMPLETEWITH MICROSCOPIC ([facility] ONLY) - Abnormal; Notable for the following:    Color, Urine YELLOW (*)    APPearance CLEAR (*)    Squamous Epithelial / LPF 0-5 (*)    All other components within normal limits  PROTIME-INR - Abnormal; Notable for the following:    Prothrombin Time 15.7 (*)    All other components within normal limits  ETHANOL - Abnormal; Notable for the following:    Alcohol, Ethyl (B) 276 (*)    All other components within normal limits  URINE DRUG SCREEN, QUALITATIVE ([facility] ONLY) - Abnormal; Notable for the following:    Benzodiazepine, Ur Scrn POSITIVE (*)    All other components within normal limits  TROPONIN I  CBG MONITORING, ED    RADIOLOGY Images were viewed by me  CT head, CXR IMPRESSION: Opacification of a posterolateral right ethmoid air cell. No intracranial mass, hemorrhage, or extra-axial fluid collection. Gray-white compartments appear  normal. IMPRESSION: No active disease.  ____________________________________________  FINAL ASSESSMENT AND PLAN  Fall, Head Injury, near-syncope, Laceration  Plan: Patient with labs and imaging as dictated above. Patient presents with normal vital signs and a normal sinus rhythm. Patient is adamant that the reason for a syncope is atrial fibrillation despite him not being in it on arrival or during his stay. I tried to reiterate that some of his symptoms are likely alcohol-related which she denies despite an alcohol level 276. He denies wanting to go to detox. I offered admission into the hospital to monitor for paroxysmal A. fib. Patient says "hell no, I want you to get this damn thing out of my hand, I want to go home." His wound is been repaired. He is advised to follow-up with his doctor for suture removal in 5 days.   Earleen Newport, MD   Earleen Newport, MD 10/20/15 1051  Earleen Newport, MD 10/20/15 1052  Earleen Newport, MD 10/20/15 [place], MD 10/20/15 1130

## 2015-10-20 NOTE — ED Notes (Signed)
Patient brought in by [facility] from home for a syncopal episode. Patient has laceration above right eyebrow. Patient admits ETOH use last PM but denies ETOH use this AM. Patient states that he also fell last PM.

## 2016-08-12 ENCOUNTER — Ambulatory Visit (INDEPENDENT_AMBULATORY_CARE_PROVIDER_SITE_OTHER): Payer: BLUE CROSS/BLUE SHIELD | Admitting: Family Medicine

## 2016-08-12 ENCOUNTER — Encounter: Payer: Self-pay | Admitting: Family Medicine

## 2016-08-12 VITALS — BP 135/80 | HR 87 | Temp 98.4°F | Resp 14 | Ht 68.0 in | Wt 250.0 lb

## 2016-08-12 DIAGNOSIS — I5022 Chronic systolic (congestive) heart failure: Secondary | ICD-10-CM | POA: Diagnosis not present

## 2016-08-12 DIAGNOSIS — I4891 Unspecified atrial fibrillation: Secondary | ICD-10-CM

## 2016-08-12 DIAGNOSIS — N400 Enlarged prostate without lower urinary tract symptoms: Secondary | ICD-10-CM | POA: Insufficient documentation

## 2016-08-12 DIAGNOSIS — Z87891 Personal history of nicotine dependence: Secondary | ICD-10-CM | POA: Insufficient documentation

## 2016-08-12 DIAGNOSIS — G4733 Obstructive sleep apnea (adult) (pediatric): Secondary | ICD-10-CM | POA: Insufficient documentation

## 2016-08-12 DIAGNOSIS — F1011 Alcohol abuse, in remission: Secondary | ICD-10-CM | POA: Insufficient documentation

## 2016-08-12 DIAGNOSIS — Z87898 Personal history of other specified conditions: Secondary | ICD-10-CM

## 2016-08-12 DIAGNOSIS — Z9989 Dependence on other enabling machines and devices: Secondary | ICD-10-CM

## 2016-08-12 DIAGNOSIS — I10 Essential (primary) hypertension: Secondary | ICD-10-CM

## 2016-08-12 DIAGNOSIS — E669 Obesity, unspecified: Secondary | ICD-10-CM | POA: Diagnosis not present

## 2016-08-12 MED ORDER — LISINOPRIL 10 MG PO TABS: 10.0000 mg | ORAL_TABLET | Freq: Every day | ORAL | 1 refills | Status: DC

## 2016-08-12 MED ORDER — METOPROLOL SUCCINATE ER 25 MG PO TB24: 25.0000 mg | ORAL_TABLET | Freq: Every day | ORAL | 1 refills | Status: DC

## 2016-08-12 MED ORDER — ALPRAZOLAM 0.5 MG PO TABS: 0.5000 mg | ORAL_TABLET | Freq: Two times a day (BID) | ORAL | 0 refills | Status: DC | PRN

## 2016-08-12 MED ORDER — APIXABAN 5 MG PO TABS: 5.0000 mg | ORAL_TABLET | Freq: Two times a day (BID) | ORAL | 1 refills | Status: DC

## 2016-08-12 NOTE — Progress Notes (Signed)
Subjective:  Patient ID: Derrick Murphy, male    DOB: 1966/01/13  Age: 51 y.o. MRN: [id number]  CC: Establish care  HPI Derrick Murphy is a 51 y.o. male presents to the clinic today to establish care.  HTN  Stable on Lisinopril and Metoprolol.  Afib  Long history of Afib.  Previously followed/managed by EP.  Currently on Metoprolol for rate control.  On Eliquis.  Intermittently having palpitations and associated chest pain. Needs cardiology referral.  Systolic CHF  Unsure of status. Last echo was in 2016 and revealed an EF of 25-30%.  Needs to see cards and needs Echo.  Alcohol abuse  Currently drinking 1-2 times a week and only having one drink.  Wants to cut back further and stop.  PMH, Surgical Hx, Family Hx, Social History reviewed and updated as below.  Past Medical History:  Diagnosis Date  . Alcohol abuse   . Atrial fibrillation ([place])   . CHF (congestive heart failure) ([place])   . Depression   . Tobacco abuse    Past Surgical History:  Procedure Laterality Date  . ABLATION    . CARDIOVERSION     Family History  Problem Relation Age of Onset  . Heart disease Mother   . Heart disease Father   . Heart disease Paternal Uncle    Social History  Substance Use Topics  . Smoking status: Former Smoker    Quit date: 06/15/2016  . Smokeless tobacco: Never Used  . Alcohol use 1.2 - 1.8 oz/week    2 - 3 Standard drinks or equivalent per week   Review of Systems  Eyes: Positive for visual disturbance.  Cardiovascular: Positive for chest pain and palpitations.  Genitourinary:       Frequent urination.  Neurological: Positive for dizziness and weakness.  Psychiatric/Behavioral: The patient is nervous/anxious.   All other systems reviewed and are negative.  Objective:   Today's Vitals: BP 135/80   Pulse 87   Temp 98.4 F (36.9 C) (Oral)   Resp 14   Ht 5\' 8"  (1.727 m)   Wt 250 lb (113.4 kg)   SpO2 97%   BMI 38.01 kg/m   Physical Exam    Constitutional: He is oriented to person, place, and time. He appears well-developed. No distress.  HENT:  Head: Normocephalic and atraumatic.  Mouth/Throat: Oropharynx is clear and moist.  Eyes: Conjunctivae are normal.  Neck: Normal range of motion. Neck supple.  Cardiovascular: Normal rate and regular rhythm.   No murmur heard. No LE edema.   Pulmonary/Chest: Effort normal and breath sounds normal.  Abdominal: Soft. He exhibits no distension. There is no tenderness.  Musculoskeletal: Normal range of motion.  Neurological: He is alert and oriented to person, place, and time.  Skin: No rash noted.  Psychiatric:  Flat affect.   Vitals reviewed.  Assessment & Plan:   Problem List Items Addressed This Visit    Obesity (BMI 30-39.9)   Relevant Orders   Hemoglobin A1c   Comprehensive metabolic panel   Lipid panel   TSH   History of alcohol abuse    Patient with minimal alcohol use at this time. Advised cessation.      Chronic systolic CHF (congestive heart failure) (HCC)    Appears stable at this time. Euvolemic on exam. Continue beta blocker and ACE inhibitor. Needs echo. Arranging for cardiology referral.      Relevant Medications   apixaban (ELIQUIS) 5 MG TABS tablet   lisinopril (PRINIVIL,ZESTRIL) 10 MG tablet  metoprolol succinate (TOPROL-XL) 25 MG 24 hr tablet   Benign essential HTN - Primary    Stable currently. Needs labs (will return for these or get at cardiology office; per patient request). Continue Metoprolol and Lisinopril.      Relevant Medications   apixaban (ELIQUIS) 5 MG TABS tablet   lisinopril (PRINIVIL,ZESTRIL) 10 MG tablet   metoprolol succinate (TOPROL-XL) 25 MG 24 hr tablet   Atrial fibrillation (HCC)    Currently in sinus rhythm. Has had several cardioversions and has had ablation. Continue metoprolol. Sending to cardiology.      Relevant Medications   apixaban (ELIQUIS) 5 MG TABS tablet   lisinopril (PRINIVIL,ZESTRIL) 10 MG tablet    metoprolol succinate (TOPROL-XL) 25 MG 24 hr tablet   Other Relevant Orders   CBC   Comprehensive metabolic panel      Outpatient Encounter Prescriptions as of 08/12/2016  Medication Sig  . ALPRAZolam (XANAX) 0.5 MG tablet Take 1 tablet (0.5 mg total) by mouth 2 (two) times daily as needed for anxiety.  Marland Kitchen apixaban (ELIQUIS) 5 MG TABS tablet Take 1 tablet (5 mg total) by mouth 2 (two) times daily.  Marland Kitchen lisinopril (PRINIVIL,ZESTRIL) 10 MG tablet Take 1 tablet (10 mg total) by mouth daily.  . metoprolol succinate (TOPROL-XL) 25 MG 24 hr tablet Take 1 tablet (25 mg total) by mouth daily.  . [DISCONTINUED] ALPRAZolam (XANAX) 0.5 MG tablet Take 0.5 mg by mouth 2 (two) times daily as needed for anxiety.  . [DISCONTINUED] apixaban (ELIQUIS) 5 MG TABS tablet Take 5 mg by mouth 2 (two) times daily.  . [DISCONTINUED] lisinopril (PRINIVIL,ZESTRIL) 10 MG tablet Take 10 mg by mouth.  . [DISCONTINUED] metoprolol succinate (TOPROL-XL) 25 MG 24 hr tablet Take 25 mg by mouth.  . [DISCONTINUED] amiodarone (PACERONE) 400 MG tablet Take 400 mg by mouth 2 (two) times daily.  . [DISCONTINUED] CHLORDIAZEPOXIDE HCL PO Take 1 Dose by mouth once.  . [DISCONTINUED] magnesium oxide (MAG-OX) 400 MG tablet Take 400 mg by mouth 2 (two) times daily.   . [DISCONTINUED] metoprolol (LOPRESSOR) 50 MG tablet Take 50 mg by mouth 2 (two) times daily.  . [DISCONTINUED] Multiple Vitamin (MULTIVITAMIN WITH MINERALS) TABS tablet Take 1 tablet by mouth daily.  . [DISCONTINUED] tamsulosin (FLOMAX) 0.4 MG CAPS capsule Take 0.4 mg by mouth.  . [DISCONTINUED] thiamine 100 MG tablet Take 100 mg by mouth daily.  . [DISCONTINUED] traZODone (DESYREL) 50 MG tablet Take 50 mg by mouth at bedtime as needed for sleep.  . [DISCONTINUED] venlafaxine XR (EFFEXOR-XR) 150 MG 24 hr capsule Take 150 mg by mouth daily with breakfast.   No facility-administered encounter medications on file as of 08/12/2016.     Follow-up: Return in about 3 months  (around 11/10/2016).  [place]

## 2016-08-12 NOTE — Assessment & Plan Note (Signed)
Currently in sinus rhythm. Has had several cardioversions and has had ablation. Continue metoprolol. Sending to cardiology.

## 2016-08-12 NOTE — Patient Instructions (Addendum)
We will set up your cardiology appt.  Return for your labs or get them drawn at the cardiology office.  Follow up with me in 3 months.  Take care  Dr. Lacinda Axon

## 2016-08-12 NOTE — Progress Notes (Signed)
Pre visit review using our clinic review tool, if applicable. No additional management support is needed unless otherwise documented below in the visit note. 

## 2016-08-12 NOTE — Assessment & Plan Note (Signed)
Appears stable at this time. Euvolemic on exam. Continue beta blocker and ACE inhibitor. Needs echo. Arranging for cardiology referral.

## 2016-08-12 NOTE — Assessment & Plan Note (Signed)
Patient with minimal alcohol use at this time. Advised cessation.

## 2016-08-12 NOTE — Assessment & Plan Note (Signed)
Stable currently. Needs labs (will return for these or get at cardiology office; per patient request). Continue Metoprolol and Lisinopril.

## 2016-08-30 ENCOUNTER — Telehealth: Payer: Self-pay | Admitting: Family Medicine

## 2016-08-30 ENCOUNTER — Other Ambulatory Visit: Payer: Self-pay | Admitting: Family Medicine

## 2016-08-30 DIAGNOSIS — I5022 Chronic systolic (congestive) heart failure: Secondary | ICD-10-CM

## 2016-08-30 DIAGNOSIS — I4891 Unspecified atrial fibrillation: Secondary | ICD-10-CM

## 2016-08-30 NOTE — Telephone Encounter (Signed)
Pt called about speaking to Dr Lacinda Axon about wanting to get a referral to see the cardiologist. Referral needed please and thank you!  Call pt @ [phone].

## 2016-08-30 NOTE — Telephone Encounter (Signed)
Recommendations?

## 2016-09-06 ENCOUNTER — Other Ambulatory Visit: Payer: Self-pay | Admitting: Family Medicine

## 2016-09-06 NOTE — Telephone Encounter (Signed)
Refilled 08/12/16. Pt last seen 08/12/16. Please advise?

## 2016-09-06 NOTE — Telephone Encounter (Signed)
Faxed

## 2016-09-10 ENCOUNTER — Ambulatory Visit (INDEPENDENT_AMBULATORY_CARE_PROVIDER_SITE_OTHER): Payer: BLUE CROSS/BLUE SHIELD | Admitting: Cardiovascular Disease

## 2016-09-10 ENCOUNTER — Encounter: Payer: Self-pay | Admitting: Cardiovascular Disease

## 2016-09-10 VITALS — BP 108/62 | HR 109 | Ht 68.0 in | Wt 252.8 lb

## 2016-09-10 DIAGNOSIS — I480 Paroxysmal atrial fibrillation: Secondary | ICD-10-CM

## 2016-09-10 DIAGNOSIS — I5022 Chronic systolic (congestive) heart failure: Secondary | ICD-10-CM | POA: Diagnosis not present

## 2016-09-10 DIAGNOSIS — Z87898 Personal history of other specified conditions: Secondary | ICD-10-CM | POA: Diagnosis not present

## 2016-09-10 DIAGNOSIS — Z9889 Other specified postprocedural states: Secondary | ICD-10-CM

## 2016-09-10 DIAGNOSIS — I10 Essential (primary) hypertension: Secondary | ICD-10-CM

## 2016-09-10 DIAGNOSIS — F1011 Alcohol abuse, in remission: Secondary | ICD-10-CM

## 2016-09-10 DIAGNOSIS — I483 Typical atrial flutter: Secondary | ICD-10-CM

## 2016-09-10 MED ORDER — METOPROLOL SUCCINATE ER 50 MG PO TB24: 50.0000 mg | ORAL_TABLET | Freq: Every day | ORAL | 11 refills | Status: DC

## 2016-09-10 NOTE — Progress Notes (Signed)
Cardiology Office Note  Date:  09/10/2016   ID:  Derrick Murphy, DOB [birth year], MRN [id number]  PCP:  Coral Spikes, DO   Chief Complaint  Patient presents with  . other    Referred by Dr Lacinda Axon, Afib, patient has been seen by  [facility] notes in [place].  Pt c/o of dizziness, occassional chest pain.  Meds reviewed verbally with pt.    HPI:  51 year old With long history of paroxysmal atrial fibrillation, atrial flutter Followed at [facility] last seen June 2017 History of ablation May 2017 presumably for atrial fibrillation though the notes indicate cavo-tricuspid isthmus ablation perhaps indicating flutter ablation Long history of alcohol abuse, obesity, sleep apnea with noncompliance on his CPAP, financial issues, who presents by Referral from Dr. Lacinda Axon for consultation of his atrial fibrillation, flutter  Notes from [facility] indicate On 07/11/15 he presented to an OSH with AF with RVR.  He underwent a cardioversion and was placed on amiodarone.  He was admitted to [facility] on 07/17/15 due to recurrent symptoms after transfer from [facility]. He was dx with atrial flutter at that time. He spontaneously converted. He underwent another cardioversion January 2016 which he states failed.  He was having episodes of syncope and for this reason underwent ablation May 2017 Notes indicating He has a h/o HFrEF with EF of 25-30%.    continues to drink ETOH  ECG from 07/15/15 and 07/16/15 revealed typical atrial flutter with variable conduction  On today's visit he reports having Increased dizziness 4 to 6 weeks ago But otherwise feels well, denies shortness of breath on exertion Heart rate elevated on today's visits, perhaps mildly improved at home but does not check it on a regular basis. Denies any leg edema, abdominal swelling, coughing, orthopnea PND  Other records reviewed on today's visit Echo 07/17/15 The left ventricle is moderately dilated. There is  normal left ventricular wall thickness. Left ventricular systolic function is severely reduced. LV ejection fraction = 25-30%. There is severe global hypokinesis of the left ventricle.  He was scheduled for repeat echocardiogram through EP at [facility] but did not follow through . presumably they wanted ejection fraction with him in normal sinus rhythm    He is asking questions today about qualifications for disability as he does not have a job   EKG on today's visit shows atrial flutter with ventricular rate 109 bpm with nonspecific ST-T wave abnormality    PMH:   has a past medical history of Alcohol abuse; Atrial fibrillation ([place]); CHF (congestive heart failure) ([place]); Depression; and Tobacco abuse. History of atrial flutter, ablation May 2017  PSH:    Past Surgical History:  Procedure Laterality Date  . ABLATION    . CARDIOVERSION      Current Outpatient Prescriptions  Medication Sig Dispense Refill  . ALPRAZolam (XANAX) 0.5 MG tablet TAKE 1 TABLET BY MOUTH TWICE A DAY AS NEEDED FOR ANXIETY 60 tablet 3  . apixaban (ELIQUIS) 5 MG TABS tablet Take 1 tablet (5 mg total) by mouth 2 (two) times daily. 60 tablet 1  . lisinopril (PRINIVIL,ZESTRIL) 10 MG tablet Take 1 tablet (10 mg total) by mouth daily. 30 tablet 1  . metoprolol succinate (TOPROL-XL) 50 MG 24 hr tablet Take 1 tablet (50 mg total) by mouth daily. 30 tablet 11   No current facility-administered medications for this visit.      Allergies:   Patient has no known allergies.   Social History:  The patient  reports that he quit smoking about 2 months ago. He has never used smokeless tobacco. He reports that he drinks about 1.2 - 1.8 oz of alcohol per week . He reports that he does not use drugs.   Family History:   family history includes Heart disease in his father, mother, and paternal uncle.    Review of Systems: Review of Systems  Constitutional: Negative.   Respiratory: Negative.   Cardiovascular: Negative.    Gastrointestinal: Negative.   Musculoskeletal: Negative.   Neurological: Positive for dizziness.  Psychiatric/Behavioral: Negative.   All other systems reviewed and are negative.    PHYSICAL EXAM: VS:  BP 108/62 (BP Location: Right Arm, Patient Position: Sitting, Cuff Size: Normal)   Pulse (!) 109   Ht 5\' 8"  (1.727 m)   Wt 252 lb 12 oz (114.6 kg)   BMI 38.43 kg/m  , BMI Body mass index is 38.43 kg/m. GEN: Well nourished, well developed, in no acute distress , obese HEENT: normal  Neck: no JVD, carotid bruits, or masses Cardiac: Tachycardic, regular rhythm, ; no murmurs, rubs, or gallops,no edema  Respiratory:  clear to auscultation bilaterally, normal work of breathing GI: soft, nontender, nondistended, + BS MS: no deformity or atrophy  Skin: warm and dry, no rash Neuro:  Strength and sensation are intact Psych: euthymic mood, full affect    Recent Labs: 10/20/2015: ALT 40; BUN 14; Creatinine, Ser 1.06; Hemoglobin 13.5; Platelets 186; Potassium 3.9; Sodium 135    Lipid Panel No results found for: CHOL, HDL, LDLCALC, TRIG    Wt Readings from Last 3 Encounters:  09/10/16 252 lb 12 oz (114.6 kg)  08/12/16 250 lb (113.4 kg)  10/20/15 263 lb 6.4 oz (119.5 kg)       ASSESSMENT AND PLAN:  Paroxysmal atrial fibrillation (HCC) Notes indicating previous paroxysmal atrial fibrillation  Chronic systolic CHF (congestive heart failure) ([place]) Appears relatively euvolemic, not requiring Lasix He did not follow-up for echocardiogram at [facility] to reassess ejection fraction after ablation  Benign essential HTN Blood pressure running low, we'll hold the lisinopril to make room possibly from more metoprolol  History of alcohol abuse Recommended alcohol cessation Long-standing history  Typical atrial flutter ([place]) On today's visit appears to be in atrial flutter, previously seen when he was managed at [facility]. He is not interested in cardioversion. He is not  interested in starting antiarrhythmic medications to restore normal sinus rhythm. He is worried about current debt and pain for procedures. Asking about disability. When asked about his symptoms, he reports having occasional lightheadedness but is otherwise relatively asymptomatic and cannot tell me when his atrial flutter recurred as he was previously a normal sinus rhythm in 2017. Finding of atrial flutter today was a surprise to him. Lots of risk factors including obesity, noncompliance with his CPAP, and alcohol use Recommended for his dizziness he hold the lisinopril If rate continues to run high, recommended he increase Toprol up to 25 mg twice a day Suggested he call our office if heart rate continues to run fast   H/O prior ablation treatment Notes indicating cavo-tricuspid isthmus ablation , presumably for flutter    Total encounter time more than 60 minutes  Greater than 50% was spent in counseling and coordination of care with the patient   Disposition:   F/U  6 months  No orders of the defined types were placed in this encounter.    Signed, Esmond Plants, M.D., Ph.D. 09/10/2016  [place],  [place] [phone]

## 2016-09-10 NOTE — Patient Instructions (Addendum)
Medication Instructions:   Consider stopping the lisinopril Increase the metoprolol up to 25 mg twice a day  If rate does not improve, Call the office  [facility], Dr. Catha Nottingham:  No new labs needed  Testing/Procedures:  No further testing at this time   I recommend watching educational videos on topics of interest to you at:       [email or web address].goemmi.com  Enter code: HEARTCARE    Follow-Up: It was a pleasure seeing you in the office today. Please call us if you have new issues that need to be addressed before your next appt.  [phone]  Your physician wants you to follow-up in: 6 months.  You will receive a reminder letter in the mail two months in advance. If you don't receive a letter, please call our office to schedule the follow-up appointment.  If you need a refill on your cardiac medications before your next appointment, please call your pharmacy.

## 2016-09-12 NOTE — Addendum Note (Signed)
Addended by: Janan Ridge on: 09/12/2016 12:20 PM   Modules accepted: Orders

## 2016-10-02 ENCOUNTER — Telehealth: Payer: Self-pay | Admitting: Cardiovascular Disease

## 2016-10-02 NOTE — Telephone Encounter (Signed)
Pt calling stating he was seen a few weeks ago and was calling to give update He has chronic CHF He is taking all the medications  We were a bit concerned about was about  HR 108  So we took out his lisinopril and doubled the metoprolol  Since we did the change  He has not felt right Last night he felt some heart palpitations This morning BP 132/115  This morning HR 156 Right now 121/86 HR 122

## 2016-10-02 NOTE — Telephone Encounter (Signed)
Spoke w/ pt.  Advised him that Dr. Rockey Situ recommends that he call his EP doctor @ [facility] or try to get in w/ [facility]. Pt states that he lives w/ his mother who is 84 and [place]. He asks if he calls EMS, if they will take him to [facility]. Advised pt that if his sx are emergent, they will evaluate him and take him to nearest hospital. He states that he has considerable debt at [facility] and would prefer not to go to a [facility] facility. He asks if he can go to [facility]. Advised him that he may f/u w/ any EP doctor, but recommend that he call [facility], as he has already est care w/ them. He states that he is undecided on how he would like to proceed; he will either resume his previous meds or call [facility] to see if they can see him, though he does not know how he will get there.  He will discuss w/ his mother and call back if we can be of further assistance.  He is appreciative of the call.

## 2016-10-02 NOTE — Telephone Encounter (Signed)
Spoke w/ pt.  He reports that at his last ov on 09/10/16 (1st visit to est care w/ Dr. Rockey Situ), he was advised:  "Consider stopping the lisinopril Increase the metoprolol up to 25 mg twice a day  If rate does not improve, Call the office  [facility], Dr. Omelia Blackwater"  Pt reports that he has stopped the lisinopril and doubled his metoprolol.  Last night, his BP was 132/115, HR 156. He took a Xanax followed by "a big gulp of alcohol" and went to bed. This am, his BP is 121/86, but his HR is still 122.  Pt feels bad and reports that he cannot go anywhere b/c he feels so weak.  Pt states that he has not been able to set up appt w/ [facility] yet. He reports that he has has several cardioversions and an ablation, but nothing can get him back into NSR. He states that he does not know what else can be done and just knows he will have to go to the ED, but he would like to avoid this. Advised him that I will make Dr. Rockey Situ aware of his concerns and call him back w/ his recommendation.

## 2016-10-04 ENCOUNTER — Telehealth: Payer: Self-pay | Admitting: Family Medicine

## 2016-10-04 NOTE — Telephone Encounter (Signed)
Pt called about needing a referral to see the electro specialist in the [place] area. Thank you!  Call pt @ [phone].

## 2016-10-07 ENCOUNTER — Telehealth: Payer: Self-pay | Admitting: Family Medicine

## 2016-10-07 NOTE — Telephone Encounter (Signed)
Pt dropped off Disability forms to be filled out.. Placed in Dr. Geralynn Ochs color folder upfront. Please advise

## 2016-10-07 NOTE — Telephone Encounter (Signed)
Dr. Rockey Situ,  Do you need me to refer him and to whom? Your note mentions someone from [facility]. Is that correct?  [place]

## 2016-10-07 NOTE — Telephone Encounter (Signed)
Paperwork was placed in the red forms folder on Dr.Cooks desk.

## 2016-10-07 NOTE — Telephone Encounter (Signed)
I will need the paperwork and details regarding this.

## 2016-10-07 NOTE — Telephone Encounter (Signed)
Pt called and stated that he is eligible for short term disability. Pt wanted to know if Dr. Lacinda Axon would fill them out or Dr. Rockey Situ. He has tons as medical records from other facilities. Please advise, thank you!  Call pt @ [phone]

## 2016-10-08 ENCOUNTER — Telehealth: Payer: Self-pay | Admitting: Family Medicine

## 2016-10-08 NOTE — Telephone Encounter (Signed)
Left message for pt to call back and schedule an appt.

## 2016-10-08 NOTE — Telephone Encounter (Signed)
Please call pt and have him schedule appt for paperwork.

## 2016-10-08 NOTE — Telephone Encounter (Signed)
Needs an appt to discuss.

## 2016-10-09 NOTE — Telephone Encounter (Signed)
Pt called back returning your call about the medication.   Call pt back @ [phone]. Thank you!

## 2016-10-09 NOTE — Telephone Encounter (Signed)
Medication was refilled.

## 2016-10-09 NOTE — Telephone Encounter (Signed)
No longer on the pts list. Discontinued 08/12/16.

## 2016-10-09 NOTE — Telephone Encounter (Signed)
Thank you! Pt was called.

## 2016-10-09 NOTE — Telephone Encounter (Signed)
He would probably be best if he go back to [facility] where he had previous ablation and electrical management They would know him, his anatomy, could probably see the same physicians

## 2016-10-10 NOTE — Telephone Encounter (Signed)
Please inform him of Dr. Donivan Scull recommendations.

## 2016-10-14 ENCOUNTER — Ambulatory Visit (INDEPENDENT_AMBULATORY_CARE_PROVIDER_SITE_OTHER): Payer: BLUE CROSS/BLUE SHIELD | Admitting: Family Medicine

## 2016-10-14 ENCOUNTER — Encounter: Payer: Self-pay | Admitting: Family Medicine

## 2016-10-14 DIAGNOSIS — I5022 Chronic systolic (congestive) heart failure: Secondary | ICD-10-CM

## 2016-10-14 DIAGNOSIS — I10 Essential (primary) hypertension: Secondary | ICD-10-CM | POA: Diagnosis not present

## 2016-10-14 DIAGNOSIS — I480 Paroxysmal atrial fibrillation: Secondary | ICD-10-CM

## 2016-10-14 NOTE — Progress Notes (Signed)
Pre visit review using our clinic review tool, if applicable. No additional management support is needed unless otherwise documented below in the visit note. 

## 2016-10-14 NOTE — Progress Notes (Signed)
   Subjective:  Patient ID: Derrick Murphy, male    DOB: 23-Jul-1965  Age: 51 y.o. MRN: [id number]  CC: Follow up, fill out disability paperwork  HPI:  51 year old male With recurrent atrial fibrillation/flutter, history of alcohol abuse, CHF, hypertension presents for follow-up.  HTN  Stable on Metoprolol, Lisinopril.  Atrial fib  Stable currently.  Was in flutter at most recent cardiology appt.  Wants to see EP at [facility].  Currently on Eliquis and Metoprolol.  Systolic CHF  Stable currently on Lisinopril and metoprolol.  No reports of increasing LE edema.    Social Hx   Social History   Social History  . Marital status: Unknown    Spouse name: N/A  . Number of children: N/A  . Years of education: N/A   Social History Main Topics  . Smoking status: Former Smoker    Quit date: 06/15/2016  . Smokeless tobacco: Never Used  . Alcohol use 1.2 - 1.8 oz/week    2 - 3 Standard drinks or equivalent per week     Comment: occassional  . Drug use: No  . Sexual activity: Yes    Partners: Female   Other Topics Concern  . None   Social History Narrative  . None    Review of Systems  Constitutional: Positive for fatigue.  Cardiovascular: Positive for palpitations.   Objective:  BP 122/74   Pulse 75   Temp 98.9 F (37.2 C) (Oral)   Wt 255 lb 4 oz (115.8 kg)   SpO2 96%   BMI 38.81 kg/m   BP/Weight 10/14/2016 09/10/2016 04/05/568  Systolic BP 794 801 655  Diastolic BP 74 62 80  Wt. (Lbs) 255.25 252.75 250  BMI 38.81 38.43 38.01    Physical Exam  Constitutional: He is oriented to person, place, and time. He appears well-developed. No distress.  Cardiovascular: Normal rate and regular rhythm.   Pulmonary/Chest: Effort normal and breath sounds normal.  Neurological: He is alert and oriented to person, place, and time.  Psychiatric:  Flat affect.  Vitals reviewed.   Lab Results  Component Value Date   WBC 6.6 10/20/2015   HGB 13.5 10/20/2015   HCT 39.5  (L) 10/20/2015   PLT 186 10/20/2015   GLUCOSE 100 (H) 10/20/2015   ALT 40 10/20/2015   AST 45 (H) 10/20/2015   NA 135 10/20/2015   K 3.9 10/20/2015   CL 103 10/20/2015   CREATININE 1.06 10/20/2015   BUN 14 10/20/2015   CO2 22 10/20/2015   INR 1.23 10/20/2015    Assessment & Plan:   Problem List Items Addressed This Visit    Atrial fibrillation ([place])    Stable at this time.This is the primary culprit for patient's disability. Form filled out today. Referring to EP at [facility]. Continue Eliquis and metoprolol.       Relevant Orders   Ambulatory referral to Cardiac Electrophysiology   Chronic systolic CHF (congestive heart failure) ([place])    Euvolemic today. Continue current meds.       Benign essential HTN    Stable. Continue Metoprolol and Lisinopril.         Follow-up: As scheduled.   [place]

## 2016-10-14 NOTE — Assessment & Plan Note (Signed)
Euvolemic today. Continue current meds.

## 2016-10-14 NOTE — Telephone Encounter (Addendum)
Pt stated that a referral to [facility] is easier for him due to him not being able to drive and he can catch a bus to [facility].

## 2016-10-14 NOTE — Assessment & Plan Note (Signed)
Stable at this time.This is the primary culprit for patient's disability. Form filled out today. Referring to EP at [facility]. Continue Eliquis and metoprolol.

## 2016-10-14 NOTE — Assessment & Plan Note (Signed)
Stable. Continue Metoprolol and Lisinopril.

## 2016-10-25 ENCOUNTER — Telehealth: Payer: Self-pay | Admitting: Family Medicine

## 2016-10-25 NOTE — Telephone Encounter (Signed)
Pt called and has a question about his apixaban (ELIQUIS) 5 MG TABS tablet. Please call him at [phone].

## 2016-10-25 NOTE — Telephone Encounter (Signed)
LVTCB

## 2016-10-28 NOTE — Telephone Encounter (Signed)
Pt actually was having a problem with xanax. Pt stated that pharmacy only gave him 15 day supply. Pt was advised to talk to pharmacy in regards to reasoning of 15 day supply.

## 2016-11-12 ENCOUNTER — Ambulatory Visit: Payer: BLUE CROSS/BLUE SHIELD | Admitting: Family Medicine

## 2016-12-04 ENCOUNTER — Other Ambulatory Visit: Payer: Self-pay | Admitting: Family Medicine

## 2017-01-20 ENCOUNTER — Telehealth: Payer: Self-pay | Admitting: Family Medicine

## 2017-01-20 NOTE — Telephone Encounter (Signed)
Pt called wanting to know if his medication of tamsulosin (FLOMAX) 0.4 MG CAPS capsulen can be on 90 day supply. Please advise?  Callpt [phone]

## 2017-01-21 MED ORDER — TAMSULOSIN HCL 0.4 MG PO CAPS: 0.4000 mg | ORAL_CAPSULE | Freq: Every day | ORAL | 0 refills | Status: DC

## 2017-01-21 NOTE — Telephone Encounter (Signed)
Script sent  

## 2017-01-30 ENCOUNTER — Ambulatory Visit (INDEPENDENT_AMBULATORY_CARE_PROVIDER_SITE_OTHER): Payer: BLUE CROSS/BLUE SHIELD | Admitting: Internal Medicine

## 2017-01-30 ENCOUNTER — Encounter: Payer: Self-pay | Admitting: Internal Medicine

## 2017-01-30 VITALS — BP 130/80 | HR 70 | Ht 68.0 in | Wt 251.8 lb

## 2017-01-30 DIAGNOSIS — I5022 Chronic systolic (congestive) heart failure: Secondary | ICD-10-CM

## 2017-01-30 DIAGNOSIS — I483 Typical atrial flutter: Secondary | ICD-10-CM | POA: Diagnosis not present

## 2017-01-30 DIAGNOSIS — I480 Paroxysmal atrial fibrillation: Secondary | ICD-10-CM

## 2017-01-30 NOTE — Progress Notes (Signed)
ELECTROPHYSIOLOGY CONSULT NOTE  Patient ID: Derrick Murphy, MRN: [id number], DOB/AGE: [birth year] 50 y.o. Admit date: (Not on file) Date of Consult: 01/30/2017  Primary Physician: Derrick Spikes, DO Primary Cardiologist: Derrick Murphy for the evaluation of disability at the request of  Derrick Spikes, DO.   HPI Derrick Murphy is a 51 y.o. male  Referred for consideration of disability assessment given a history of atrial arrhythmias    He has a history of atrial fibrillation and atrial flutter with a rapid rate. He was seen 12/16 at [facility]. He underwent ablation 6/17 including a pulmonary vein isolation and the caval tricuspid isthmus ablation. They also placed left roof lesions placed;   bidirectional block was noted across the roof line.  An office follow-up is recommended that he discontinue his amiodarone and continue anticoagulation. It was noted that he was compliant with CPAP. Also noted that he was continuing to use alcohol. However this juncture, he says he is no longer drinking. His girlfriend recently passed away from consequences of alcoholism.  He has a history of recurrent syncope.  He has had none in the last year or so. Apart from his episodic arrhythmia, he has had no apparent of exercise tolerance. He is able to climb stairs and carrying things. He does not have nocturnal dyspnea orthopnea or peripheral edema.   Echocardiogram 12/16 had demonstrated an EF of 25-30% , both by echo as well as Myoview.  2/18 he was noted to be with atypical atrial flutter with a rapid ventricular response  5/18 he was in the [facility] emergency room for atrial flutter-atypical with a rapid ventricular rate  these episodes are occurring every month or 2. They typically lasts hours.  He is trying to get disability paperwork signed by the doctors at [facility]. He has been unsuccessful in getting this time.  Past Medical History:  Diagnosis Date    . Alcohol abuse   . Atrial fibrillation ([place])   . CHF (congestive heart failure) ([place])   . Depression   . Tobacco abuse       Surgical History:  Past Surgical History:  Procedure Laterality Date  . ABLATION    . CARDIOVERSION       Home Meds: Prior to Admission medications   Medication Sig Start Date End Date Taking? Authorizing Provider  ALPRAZolam Duanne Moron) 0.5 MG tablet TAKE 1 TABLET BY MOUTH TWICE A DAY AS NEEDED FOR ANXIETY 09/06/16  Yes Cook, Jayce G, DO  apixaban (ELIQUIS) 5 MG TABS tablet Take 1 tablet (5 mg total) by mouth 2 (two) times daily. 08/12/16  Yes Cook, Jayce G, DO  lisinopril (PRINIVIL,ZESTRIL) 10 MG tablet Take 1 tablet (10 mg total) by mouth daily. 08/12/16 08/12/17 Yes Cook, Jayce G, DO  magnesium oxide (MAG-OX) 400 MG tablet Take 400 mg by mouth daily.   Yes [provider]  metoprolol succinate (TOPROL-XL) 25 MG 24 hr tablet Take 25 mg by mouth daily.   Yes [provider]  tamsulosin (FLOMAX) 0.4 MG CAPS capsule Take 1 capsule (0.4 mg total) by mouth at bedtime. 01/21/17  Yes Derrick Spikes, DO    Allergies: No Known Allergies  Social History   Social History  . Marital status: Unknown    Spouse name: N/A  . Number of children: N/A  . Years of education: N/A   Occupational History  . Not on file.   Social History Main  Topics  . Smoking status: Former Smoker    Quit date: 06/15/2016  . Smokeless tobacco: Never Used  . Alcohol use 1.2 - 1.8 oz/week    2 - 3 Standard drinks or equivalent per week     Comment: occassional  . Drug use: No  . Sexual activity: Yes    Partners: Female   Other Topics Concern  . Not on file   Social History Narrative  . No narrative on file     Family History  Problem Relation Age of Onset  . Heart disease Mother   . Heart disease Father   . Heart disease Paternal Uncle      ROS:  Please see the history of present illness.     All other systems reviewed and negative.    Physical Exam: Blood  pressure 130/80, pulse 70, height 5\' 8"  (1.727 m), weight 251 lb 12 oz (114.2 kg). General: Well developed, well nourished male in no acute distress. Head: Normocephalic, atraumatic, sclera non-icteric, no xanthomas, nares are without discharge. EENT: normal  Lymph Nodes:  none Neck: Negative for carotid bruits. JVD not elevated. Back:without scoliosis kyphosis  Lungs: Clear bilaterally to auscultation without wheezes, rales, or rhonchi. Breathing is unlabored. Heart: RRR with S1 S2. No  murmur . No rubs, or gallops appreciated. Abdomen: Soft, non-tender, non-distended with normoactive bowel sounds. No hepatomegaly. No rebound/guarding. No obvious abdominal masses. Msk:  Strength and tone appear normal for age. Extremities: No clubbing or cyanosis. No  edema.  Distal pedal pulses are 2+ and equal bilaterally. Skin: Warm and Dry Neuro: Alert and oriented X 3. CN III-XII intact Grossly normal sensory and motor function . Psych:  Responds to questions appropriately with a falt affect.      Labs: Cardiac Enzymes No results for input(s): CKTOTAL, CKMB, TROPONINI in the last 72 hours. CBC Lab Results  Component Value Date   WBC 6.6 10/20/2015   HGB 13.5 10/20/2015   HCT 39.5 (L) 10/20/2015   MCV 96.5 10/20/2015   PLT 186 10/20/2015   PROTIME: No results for input(s): LABPROT, INR in the last 72 hours. Chemistry No results for input(s): NA, K, CL, CO2, BUN, CREATININE, CALCIUM, PROT, BILITOT, ALKPHOS, ALT, AST, GLUCOSE in the last 168 hours.  Invalid input(s): LABALBU Lipids No results found for: CHOL, HDL, LDLCALC, TRIG BNP No results found for: PROBNP Thyroid Function Tests: No results for input(s): TSH, T4TOTAL, T3FREE, THYROIDAB in the last 72 hours.  Invalid input(s): FREET3 Miscellaneous No results found for: DDIMER  Radiology/Studies:  No results found.  EKG:  Sinus at 70 Intervals 16/08/40  Assessment and Plan:   Atrial fibrillation/atrial flutter status post  ablation (PVI/left atrial roof line)   Cardiomyopathy-nonischemic-presumed  Depression  Syncope    The patient remains anticoagulated. We will check his surveillance laboratories Murphy. He is having no overt bleeding.  He continues to struggle with paroxysms of arrhythmia. He had LV dysfunction; this needs to be reassessed. In the event that this persists, alternative medications might need to be considered idea Entresto, Aldactone etc.   His syncope history sounds like mostly orthostatic. It has been quiescent over the last year.   He is under great deal of psychosocial stress related to the recent death of his girlfriend. He has also stopped drinking. He acknowledges some degree of depression.  He is seeking support for disability application.   Virl Axe

## 2017-01-30 NOTE — Patient Instructions (Signed)
Medication Instructions: - Your physician recommends that you continue on your current medications as directed. Please refer to the Current Medication list given to you today.  Labwork: - Your physician recommends that you return for FASTING lab work next week: TSH/ lipid/ CMP/ HgbA1C/ CBC  Procedures/Testing: - Your physician has requested that you have an echocardiogram. Echocardiography is a painless test that uses sound waves to create images of your heart. It provides your doctor with information about the size and shape of your heart and how well your heart's chambers and valves are working. This procedure takes approximately one hour. There are no restrictions for this procedure.   Follow-Up: - Your physician recommends that you schedule a follow-up appointment in: 2 months with Dr. Caryl Comes   Any Additional Special Instructions Will Be Listed Below (If Applicable).     If you need a refill on your cardiac medications before your next appointment, please call your pharmacy.

## 2017-02-07 ENCOUNTER — Other Ambulatory Visit (INDEPENDENT_AMBULATORY_CARE_PROVIDER_SITE_OTHER): Payer: BLUE CROSS/BLUE SHIELD | Admitting: *Deleted

## 2017-02-07 DIAGNOSIS — I5022 Chronic systolic (congestive) heart failure: Secondary | ICD-10-CM

## 2017-02-07 DIAGNOSIS — I480 Paroxysmal atrial fibrillation: Secondary | ICD-10-CM

## 2017-02-08 LAB — COMPREHENSIVE METABOLIC PANEL
ALT: 39 IU/L (ref 0–44)
AST: 43 IU/L — ABNORMAL HIGH (ref 0–40)
Albumin/Globulin Ratio: 1.3 (ref 1.2–2.2)
Albumin: 4 g/dL (ref 3.5–5.5)
Alkaline Phosphatase: 80 IU/L (ref 39–117)
BUN/Creatinine Ratio: 15 (ref 9–20)
BUN: 14 mg/dL (ref 6–24)
Bilirubin Total: <0.2 mg/dL (ref 0.0–1.2)
CO2: 24 mmol/L (ref 20–29)
Calcium: 8.9 mg/dL (ref 8.7–10.2)
Chloride: 99 mmol/L (ref 96–106)
Creatinine, Ser: 0.95 mg/dL (ref 0.76–1.27)
GFR calc Af Amer: 107 mL/min/{1.73_m2} (ref >59)
GFR calc non Af Amer: 93 mL/min/{1.73_m2} (ref >59)
Globulin, Total: 3.1 g/dL (ref 1.5–4.5)
Glucose: 125 mg/dL — ABNORMAL HIGH (ref 65–99)
Potassium: 4.8 mmol/L (ref 3.5–5.2)
Sodium: 138 mmol/L (ref 134–144)
Total Protein: 7.1 g/dL (ref 6.0–8.5)

## 2017-02-08 LAB — LIPID PANEL
Chol/HDL Ratio: 8.8 ratio — ABNORMAL HIGH (ref 0.0–5.0)
Cholesterol, Total: 245 mg/dL — ABNORMAL HIGH (ref 100–199)
HDL: 28 mg/dL — ABNORMAL LOW (ref >39)
Triglycerides: 1020 mg/dL (ref 0–149)

## 2017-02-08 LAB — CBC WITH DIFFERENTIAL/PLATELET
Basophils Absolute: 0 10*3/uL (ref 0.0–0.2)
Basos: 1 %
EOS (ABSOLUTE): 0.1 10*3/uL (ref 0.0–0.4)
Eos: 2 %
Hematocrit: 45.1 % (ref 37.5–51.0)
Hemoglobin: 15.4 g/dL (ref 13.0–17.7)
Immature Grans (Abs): 0 10*3/uL (ref 0.0–0.1)
Immature Granulocytes: 0 %
Lymphocytes Absolute: 1.9 10*3/uL (ref 0.7–3.1)
Lymphs: 30 %
MCH: 34.5 pg — ABNORMAL HIGH (ref 26.6–33.0)
MCHC: 34.1 g/dL (ref 31.5–35.7)
MCV: 101 fL — ABNORMAL HIGH (ref 79–97)
Monocytes Absolute: 0.4 10*3/uL (ref 0.1–0.9)
Monocytes: 6 %
Neutrophils Absolute: 3.9 10*3/uL (ref 1.4–7.0)
Neutrophils: 61 %
Platelets: 292 10*3/uL (ref 150–379)
RBC: 4.47 x10E6/uL (ref 4.14–5.80)
RDW: 13 % (ref 12.3–15.4)
WBC: 6.3 10*3/uL (ref 3.4–10.8)

## 2017-02-08 LAB — TSH: TSH: 1.59 u[IU]/mL (ref 0.450–4.500)

## 2017-02-08 LAB — HEMOGLOBIN A1C
Est. average glucose Bld gHb Est-mCnc: 108 mg/dL
Hgb A1c MFr Bld: 5.4 % (ref 4.8–5.6)

## 2017-02-12 ENCOUNTER — Other Ambulatory Visit: Payer: Self-pay | Admitting: Family Medicine

## 2017-02-14 NOTE — Telephone Encounter (Signed)
Faxed script [facility]

## 2017-02-18 ENCOUNTER — Ambulatory Visit (INDEPENDENT_AMBULATORY_CARE_PROVIDER_SITE_OTHER): Payer: BLUE CROSS/BLUE SHIELD | Admitting: Family Medicine

## 2017-02-18 ENCOUNTER — Encounter: Payer: Self-pay | Admitting: Family Medicine

## 2017-02-18 ENCOUNTER — Encounter: Payer: Self-pay | Admitting: Emergency Medicine

## 2017-02-18 ENCOUNTER — Emergency Department
Admission: EM | Admit: 2017-02-18 | Discharge: 2017-02-18 | Discharge status: Against Medical Advice | Payer: BLUE CROSS/BLUE SHIELD | Attending: Emergency Medicine | Admitting: Emergency Medicine

## 2017-02-18 VITALS — HR 161 | Temp 99.4°F | Resp 18 | Wt 257.0 lb

## 2017-02-18 DIAGNOSIS — Z87891 Personal history of nicotine dependence: Secondary | ICD-10-CM | POA: Diagnosis not present

## 2017-02-18 DIAGNOSIS — I5042 Chronic combined systolic (congestive) and diastolic (congestive) heart failure: Secondary | ICD-10-CM | POA: Diagnosis not present

## 2017-02-18 DIAGNOSIS — R000 Tachycardia, unspecified: Secondary | ICD-10-CM | POA: Diagnosis not present

## 2017-02-18 DIAGNOSIS — Z79899 Other long term (current) drug therapy: Secondary | ICD-10-CM | POA: Insufficient documentation

## 2017-02-18 DIAGNOSIS — I4891 Unspecified atrial fibrillation: Secondary | ICD-10-CM | POA: Insufficient documentation

## 2017-02-18 LAB — CBC WITH DIFFERENTIAL/PLATELET
Basophils Absolute: 0.1 10*3/uL (ref 0–0.1)
Basophils Relative: 1 %
Eosinophils Absolute: 0 10*3/uL (ref 0–0.7)
Eosinophils Relative: 0 %
HCT: 42.6 % (ref 40.0–52.0)
Hemoglobin: 14.7 g/dL (ref 13.0–18.0)
Lymphocytes Relative: 14 %
Lymphs Abs: 1.5 10*3/uL (ref 1.0–3.6)
MCH: 34.3 pg — ABNORMAL HIGH (ref 26.0–34.0)
MCHC: 34.4 g/dL (ref 32.0–36.0)
MCV: 99.5 fL (ref 80.0–100.0)
Monocytes Absolute: 0.8 10*3/uL (ref 0.2–1.0)
Monocytes Relative: 8 %
Neutro Abs: 7.9 10*3/uL — ABNORMAL HIGH (ref 1.4–6.5)
Neutrophils Relative %: 77 %
Platelets: 200 10*3/uL (ref 150–440)
RBC: 4.28 MIL/uL — ABNORMAL LOW (ref 4.40–5.90)
RDW: 13.5 % (ref 11.5–14.5)
WBC: 10.3 10*3/uL (ref 3.8–10.6)

## 2017-02-18 MED ORDER — ADENOSINE 6 MG/2ML IV SOLN
6.0000 mg | Freq: Once | INTRAVENOUS | Status: DC
  Filled 2017-02-18: qty 2

## 2017-02-18 MED ORDER — METOPROLOL TARTRATE 5 MG/5ML IV SOLN
5.0000 mg | Freq: Once | INTRAVENOUS | Status: AC
  Administered 2017-02-18: 5 mg via INTRAVENOUS
  Filled 2017-02-18: qty 5

## 2017-02-18 MED ORDER — METOPROLOL TARTRATE 25 MG PO TABS: 50.0000 mg | ORAL_TABLET | Freq: Two times a day (BID) | ORAL | 0 refills | Status: DC

## 2017-02-18 MED ORDER — MAGNESIUM SULFATE 2 GM/50ML IV SOLN
2.0000 g | Freq: Once | INTRAVENOUS | Status: AC
  Administered 2017-02-18: 2 g via INTRAVENOUS
  Filled 2017-02-18: qty 50

## 2017-02-18 MED ORDER — METOPROLOL TARTRATE 50 MG PO TABS
50.0000 mg | ORAL_TABLET | Freq: Once | ORAL | Status: AC
  Administered 2017-02-18: 50 mg via ORAL
  Filled 2017-02-18: qty 1

## 2017-02-18 NOTE — Discharge Instructions (Signed)
Unfortunately today we were unable to get your heart rate under control. I recommended that he stay in the hospital for strong medication to bring your heart rate down, however you declined. You are leaving the hospital today Derrick Murphy.  Please know that you are more than welcome to return to our emergency department at any time and we are happy to continue your care. If you change your mind at any point tonight come back and we will admit you to the hospital. If you choose not to come back, please call your cardiologist Dr. Caryl Comes and make an appointment to see him tomorrow at the very latest.  It was a pleasure to take care of you today, and thank you for coming to our emergency department.  If you have any questions or concerns before leaving please ask the nurse to grab me and I'm more than happy to go through your aftercare instructions again.  If you were prescribed any opioid pain medication today such as Norco, Vicodin, Percocet, morphine, hydrocodone, or oxycodone please make sure you do not drive when you are taking this medication as it can alter your ability to drive safely.  If you have any concerns once you are home that you are not improving or are in fact getting worse before you can make it to your follow-up appointment, please do not hesitate to call [phone] and come back for further evaluation.  Darel Hong, MD  Results for orders placed or performed during the hospital encounter of 02/18/17  CBC with Differential  Result Value Ref Range   WBC 10.3 3.8 - 10.6 K/uL   RBC 4.28 (L) 4.40 - 5.90 MIL/uL   Hemoglobin 14.7 13.0 - 18.0 g/dL   HCT 42.6 40.0 - 52.0 %   MCV 99.5 80.0 - 100.0 fL   MCH 34.3 (H) 26.0 - 34.0 pg   MCHC 34.4 32.0 - 36.0 g/dL   RDW 13.5 11.5 - 14.5 %   Platelets 200 150 - 440 K/uL   Neutrophils Relative % 77 %   Neutro Abs 7.9 (H) 1.4 - 6.5 K/uL   Lymphocytes Relative 14 %   Lymphs Abs 1.5 1.0 - 3.6 K/uL   Monocytes Relative 8 %   Monocytes  Absolute 0.8 0.2 - 1.0 K/uL   Eosinophils Relative 0 %   Eosinophils Absolute 0.0 0 - 0.7 K/uL   Basophils Relative 1 %   Basophils Absolute 0.1 0 - 0.1 K/uL

## 2017-02-18 NOTE — ED Triage Notes (Signed)
Patient presents to the ED via EMS from his doctor's office.  Patient reports history of afib and began to have an episode of afib with tachycardia at the doctor's office.  Patient states, "I think I need another ablasion."  Patient states, "it feels like I just drank a whole pot of coffee."  Patient denies shortness of breath and chest pain.  Patient is alert and oriented x 4 at this time.

## 2017-02-18 NOTE — ED Notes (Signed)
Cardiac monitor ringing out v fib/v tach. Rhythm strip printed and showed to Dr. Mable Paris. He states, "those are just t waves that's not v tach."

## 2017-02-18 NOTE — ED Provider Notes (Signed)
[facility] Emergency Department Provider Note  ____________________________________________   First MD Initiated Contact with Patient 02/18/17 1646     (approximate)  I have reviewed the triage vital signs and the nursing notes.   HISTORY  Chief Complaint Tachycardia   HPI Derrick Murphy is a 51 y.o. male who is sent to the emergency department by his primary care physician for elevated heart rate. The patient was going to his doctor today for regular checkup. He is a long-standing history of atrial fibrillation and atrial flutter and has failed multiple ablations. In his usual state of health and he denies chest pain, shortness of breath, abdominal pain, nausea, vomiting. He has not passed out. His only medication for his atrial fibrillation is 25 mg of long-acting metoprolol a day as well as eliquis for anticoagulation.  He's not sure when he went into his arrhythmia.   Past Medical History:  Diagnosis Date  . Alcohol abuse   . Atrial fibrillation ([place])   . CHF (congestive heart failure) ([place])   . Depression   . Tobacco abuse     Patient Active Problem List   Diagnosis Date Noted  . Tachycardia 02/18/2017  . Atrial fibrillation ([place]) 08/12/2016  . Obesity (BMI 30-39.9) 08/12/2016  . History of alcohol abuse 08/12/2016  . Former smoker 08/12/2016  . Chronic systolic CHF (congestive heart failure) ([place]) 08/12/2016  . Benign essential HTN 08/12/2016  . OSA on CPAP 08/12/2016  . BPH (benign prostatic hyperplasia) 08/12/2016    Past Surgical History:  Procedure Laterality Date  . ABLATION    . CARDIOVERSION      Prior to Admission medications   Medication Sig Start Date End Date Taking? Authorizing Provider  ALPRAZolam Duanne Moron) 0.5 MG tablet TAKE 1 TABLET BY MOUTH TWICE A DAY AS NEEDED FOR ANXIETY 02/12/17   Coral Spikes, DO  apixaban (ELIQUIS) 5 MG TABS tablet Take 1 tablet (5 mg total) by mouth 2 (two) times daily. 08/12/16   Coral Spikes,  DO  lisinopril (PRINIVIL,ZESTRIL) 10 MG tablet Take 1 tablet (10 mg total) by mouth daily. 08/12/16 08/12/17  Coral Spikes, DO  magnesium oxide (MAG-OX) 400 MG tablet Take 400 mg by mouth daily.    [provider]  metoprolol succinate (TOPROL-XL) 25 MG 24 hr tablet Take 25 mg by mouth daily.    [provider]  metoprolol tartrate (LOPRESSOR) 25 MG tablet Take 2 tablets (50 mg total) by mouth 2 (two) times daily. 02/18/17 02/18/18  Darel Hong, MD  tamsulosin (FLOMAX) 0.4 MG CAPS capsule Take 1 capsule (0.4 mg total) by mouth at bedtime. 01/21/17   Coral Spikes, DO    Allergies Patient has no known allergies.  Family History  Problem Relation Age of Onset  . Heart disease Mother   . Heart disease Father   . Heart disease Paternal Uncle     Social History Social History  Substance Use Topics  . Smoking status: Former Smoker    Quit date: 06/15/2016  . Smokeless tobacco: Never Used  . Alcohol use 1.2 - 1.8 oz/week    2 - 3 Standard drinks or equivalent per week     Comment: occassional    Review of Systems Constitutional: No fever/chills Eyes: No visual changes. ENT: No sore throat. Cardiovascular: Denies chest pain. Respiratory: Denies shortness of breath. Gastrointestinal: No abdominal pain.  No nausea, no vomiting.  No diarrhea.  No constipation. Genitourinary: Negative for dysuria. Musculoskeletal: Negative for back pain.  Skin: Negative for rash. Neurological: Negative for headaches, focal weakness or numbness.   ____________________________________________   PHYSICAL EXAM:  VITAL SIGNS: ED Triage Vitals  Enc Vitals Group     BP 02/18/17 1639 116/75     Pulse Rate 02/18/17 1639 (!) 171     Resp 02/18/17 1639 17     Temp 02/18/17 1639 99.6 F (37.6 C)     Temp Source 02/18/17 1639 Oral     SpO2 02/18/17 1639 95 %     Weight 02/18/17 1641 255 lb (115.7 kg)     Height 02/18/17 1641 5\' 8"  (1.727 m)     Head Circumference --      Peak Flow  --      Pain Score 02/18/17 1639 0     Pain Loc --      Pain Edu? --      Excl. in [place]? --     Constitutional: Alert and oriented 4 well appearing nontoxic no diaphoresis speaks in full clear sentences Eyes: PERRL EOMI. Head: Atraumatic. Nose: No congestion/rhinnorhea. Mouth/Throat: No trismus Neck: No stridor.   Cardiovascular: Irregularly irregular and tachycardic no murmurs appreciated Respiratory: Normal respiratory effort.  No retractions. Lungs CTAB and moving good air Gastrointestinal: Soft nontender Musculoskeletal: No lower extremity edema   Neurologic:  Normal speech and language. No gross focal neurologic deficits are appreciated. Skin:  Skin is warm, dry and intact. No rash noted. Psychiatric: Mood and affect are normal. Speech and behavior are normal.    ____________________________________________   DIFFERENTIAL includes but not limited to  Patient fibrillation, atrial flutter, ventricular tachycardia, SVT ____________________________________________   LABS (all labs ordered are listed, but only abnormal results are displayed)  Labs Reviewed  CBC WITH DIFFERENTIAL/PLATELET - Abnormal; Notable for the following:       Result Value   RBC 4.28 (*)    MCH 34.3 (*)    Neutro Abs 7.9 (*)    All other components within normal limits  COMPREHENSIVE METABOLIC PANEL    Labs unremarkable ______________________________________  EKG  ED ECG REPORT I, Darel Hong, the attending physician, personally viewed and interpreted this ECG.  Date: 02/18/2017 EKG Time: 1636 Rate: 168 Rhythm: Atrial fibrillation and atrial flutter rapid ventricular response QRS Axis: normal Intervals: normal ST/T Wave abnormalities: normal Narrative Interpretation: Abnormal  ____________________________________________  RADIOLOGY  ________________________________________   PROCEDURES  Procedure(s) performed: no  Procedures  Critical Care performed: yes  CRITICAL  CARE Performed by: Darel Hong   Total critical care time: 35 minutes  Critical care time was exclusive of separately billable procedures and treating other patients.  Critical care was necessary to treat or prevent imminent or life-threatening deterioration.  Critical care was time spent personally by me on the following activities: development of treatment plan with patient and/or surrogate as well as nursing, discussions with consultants, evaluation of patient's response to treatment, examination of patient, obtaining history from patient or surrogate, ordering and performing treatments and interventions, ordering and review of laboratory studies, ordering and review of radiographic studies, pulse oximetry and re-evaluation of patient's condition.   Observation: no ____________________________________________   INITIAL IMPRESSION / ASSESSMENT AND PLAN / ED COURSE  Pertinent labs & imaging results that were available during my care of the patient were reviewed by me and considered in my medical decision making (see chart for details).  The patient arrives irregularly irregular and tachycardic to the 160s 170s although with a normal blood pressure. He has a long-standing history of atrial fibrillation  which is difficult to control. He started taking metoprolol at home and he expresses desire to try to go home so I will give him an oral dose as well as an IV dose reevaluate.     ----------------------------------------- 6:30 PM on 02/18/2017 -----------------------------------------  I discussed the case with Dr. Oval Linsey cardiologist on call for [facility] who recommended electrical cardioversion as the patient has not missed any doses of his eliquis.  I offered this to the patient however he said he has had 5 failed cardioversions in the past and he declines cardioversion today.  ----------------------------------------- 6:56 PM on  02/18/2017 -----------------------------------------  After 10 mg of intravenous metoprolol and 50 mg of oral metoprolol patient's heart rate remains in the 140s and his blood pressures in the 110s. At this point I recommended inpatient admission for a diltiazem drip and cardiology consultation, however the patient declined. He is alert and oriented 4 and has capacity to make medical decisions. He says that he has been shocked 5 times and has had multiple ablations which only worked temporarily. He does not believe that an inpatient admission would benefit him because even if we were able to get his heart rate under control it would go back up into several days. I have messaged his cardiologist Dr. Caryl Comes to help facilitate urgent follow-up tomorrow. He is leaving the hospital [place]. He understands that he is more than welcome to return to our emergency department at any point and we are happy to continue his care.  FINAL CLINICAL IMPRESSION(S) / ED DIAGNOSES  Final diagnoses:  Atrial fibrillation with rapid ventricular response (HCC)      NEW MEDICATIONS STARTED DURING THIS VISIT:  Discharge Medication List as of 02/18/2017  6:54 PM    START taking these medications   Details  metoprolol tartrate (LOPRESSOR) 25 MG tablet Take 2 tablets (50 mg total) by mouth 2 (two) times daily., Starting Tue 02/18/2017, Until Wed 02/18/2018, Print         Note:  This document was prepared using Dragon voice recognition software and may include unintentional dictation errors.     Darel Hong, MD 02/18/17 2028

## 2017-02-18 NOTE — Assessment & Plan Note (Addendum)
New acute problem. EKG - rate 171. SVT versus atrial flutter. No resolution with vagal manuvers (carotid sinus massage, valsalva). EMS called. Patient sent directly to the ER for treatment.

## 2017-02-18 NOTE — Progress Notes (Signed)
Subjective:  Patient ID: Derrick Murphy, male    DOB: [birth year]  Age: 51 y.o. MRN: [id number]  CC: Follow up   HPI:  51 year old male with a long-standing history of atrial fibrillation and atrial flutter, congestive heart failure, hypertension presents for followup.  Patient was presenting for follow-up. He was brought back to the room and upon getting his vital signs he was found to have a rapid heart rate in the 170s. Patient states that when he got here he felt his heart rate elevating. He states that he felt like he was having a panic attack. No known inciting factor. He endorses compliance with his medications. He took metoprolol this morning. Patient reported associated shortness of breath. No chest pain. He states that he has had this previously on several occasions. EKG was obtained and revealed a flutter vs SVT (HR 171). EMS called.   Social Hx   Social History   Social History  . Marital status: Unknown    Spouse name: N/A  . Number of children: N/A  . Years of education: N/A   Social History Main Topics  . Smoking status: Former Smoker    Quit date: 06/15/2016  . Smokeless tobacco: Never Used  . Alcohol use 1.2 - 1.8 oz/week    2 - 3 Standard drinks or equivalent per week     Comment: occassional  . Drug use: No  . Sexual activity: Yes    Partners: Female   Other Topics Concern  . None   Social History Narrative  . None    Review of Systems  Respiratory: Positive for shortness of breath.   Cardiovascular: Positive for palpitations.   Objective:  Pulse (!) 161   Temp 99.4 F (37.4 C) (Oral)   Resp 18   Wt 257 lb (116.6 kg)   SpO2 96%   BMI 39.08 kg/m   BP/Weight 02/18/2017 02/18/2017 7/62/2633  Systolic BP 354 - 562  Diastolic BP 75 - 80  Wt. (Lbs) 255 257 251.75  BMI 38.77 39.08 38.28   Physical Exam  Constitutional: He is oriented to person, place, and time. He appears well-developed.  Appears anxious.  Cardiovascular:  Regular rhythm.  Tachycardic  Pulmonary/Chest: Effort normal. He has no wheezes. He has no rales.  Neurological: He is alert and oriented to person, place, and time.  Psychiatric: He has a normal mood and affect.  Vitals reviewed.   Lab Results  Component Value Date   WBC 6.3 02/07/2017   HGB 15.4 02/07/2017   HCT 45.1 02/07/2017   PLT 292 02/07/2017   GLUCOSE 125 (H) 02/07/2017   CHOL 245 (H) 02/07/2017   TRIG 1,020 (HH) 02/07/2017   HDL 28 (L) 02/07/2017   LDLCALC Comment 02/07/2017   ALT 39 02/07/2017   AST 43 (H) 02/07/2017   NA 138 02/07/2017   K 4.8 02/07/2017   CL 99 02/07/2017   CREATININE 0.95 02/07/2017   BUN 14 02/07/2017   CO2 24 02/07/2017   TSH 1.590 02/07/2017   INR 1.23 10/20/2015   HGBA1C 5.4 02/07/2017    Assessment & Plan:   Problem List Items Addressed This Visit      Other   Tachycardia - Primary    New acute problem. EKG - rate 171. SVT versus atrial flutter. No resolution with vagal manuvers (carotid sinus massage, valsalva). EMS called. Patient sent directly to the ER for treatment.      Relevant Orders   EKG 12-Lead (Completed)     Follow-up:  PRN  [place]

## 2017-02-19 ENCOUNTER — Telehealth: Payer: Self-pay | Admitting: Internal Medicine

## 2017-02-19 NOTE — Telephone Encounter (Signed)
Pt states he went to see his PCP, and while he was there his HR was 178, and they transported him to the ED. He is calling to let us know, and asks if he needs an appt. Please advise.

## 2017-02-19 NOTE — Telephone Encounter (Signed)
Patient was at PCP office yesterday with heart rate of 175 and was sent to ED for evaluation. EKG was sent over and reviewed by our PA here in the office. Patient would like to know what to do now. Reviewed chart and offered him appointment for tomorrow with Dr. Caryl Comes at 09:00 AM with arrival at 08:45AM to register. He verbalized agreement with this plan and had no further questions at this time.

## 2017-02-20 ENCOUNTER — Ambulatory Visit (INDEPENDENT_AMBULATORY_CARE_PROVIDER_SITE_OTHER): Payer: BLUE CROSS/BLUE SHIELD | Admitting: Internal Medicine

## 2017-02-20 ENCOUNTER — Encounter: Payer: Self-pay | Admitting: Internal Medicine

## 2017-02-20 VITALS — BP 106/64 | HR 69 | Ht 68.0 in | Wt 258.0 lb

## 2017-02-20 DIAGNOSIS — I428 Other cardiomyopathies: Secondary | ICD-10-CM

## 2017-02-20 DIAGNOSIS — I480 Paroxysmal atrial fibrillation: Secondary | ICD-10-CM

## 2017-02-20 DIAGNOSIS — I483 Typical atrial flutter: Secondary | ICD-10-CM

## 2017-02-20 NOTE — Progress Notes (Signed)
Patient Care Team: Coral Spikes, DO as PCP - General (Family Medicine) Minna Merritts, MD as Consulting Physician (Cardiology)   HPI  Derrick Murphy is a 51 y.o. male Seen in follow-up for atrial arrhythmias. He has undergone prior ablation pulmonary veins, cava tricuspid isthmus and roof. Records and Results Reviewed ECG 7/31 >> narrow QRS tachycardia with flutter 2;1  he is anticoagulated with apixoban  He also has a history of recurrent syncope.  Most recent LVEF 12/16 25-30%.  He has had interval tachycardia. As noted above. It was mild palpitations and mild shortness of breath. It terminated spontaneously after having received some metoprolol ER. These episodes recur every 3 weeks or so. Mostly they are well-tolerated and self limited at less than 18-24 hours. The one that took him to [facility] a few months ago associated with chest discomfort which is unusual.   Date Cr Hgb  7/18 0.65 14.7      Past Medical History:  Diagnosis Date  . Alcohol abuse   . Atrial fibrillation ([place])   . CHF (congestive heart failure) ([place])   . Depression   . Tobacco abuse     Past Surgical History:  Procedure Laterality Date  . ABLATION    . CARDIOVERSION      Current Outpatient Prescriptions  Medication Sig Dispense Refill  . ALPRAZolam (XANAX) 0.5 MG tablet TAKE 1 TABLET BY MOUTH TWICE A DAY AS NEEDED FOR ANXIETY 60 tablet 1  . apixaban (ELIQUIS) 5 MG TABS tablet Take 1 tablet (5 mg total) by mouth 2 (two) times daily. 60 tablet 1  . lisinopril (PRINIVIL,ZESTRIL) 10 MG tablet Take 1 tablet (10 mg total) by mouth daily. 30 tablet 1  . magnesium oxide (MAG-OX) 400 MG tablet Take 400 mg by mouth daily.    . metoprolol succinate (TOPROL-XL) 25 MG 24 hr tablet Take 25 mg by mouth daily.    . metoprolol tartrate (LOPRESSOR) 25 MG tablet Take 2 tablets (50 mg total) by mouth 2 (two) times daily. (Patient taking differently: Take 25 mg by mouth 2 (two) times daily. ) 60 tablet 0  .  tamsulosin (FLOMAX) 0.4 MG CAPS capsule Take 1 capsule (0.4 mg total) by mouth at bedtime. 90 capsule 0   No current facility-administered medications for this visit.     No Known Allergies    Review of Systems negative except from HPI and PMH  Physical Exam BP 106/64 (BP Location: Left Arm, Patient Position: Sitting, Cuff Size: Normal)   Pulse 69   Ht 5\' 8"  (1.727 m)   Wt 258 lb (117 kg)   BMI 39.23 kg/m  Well developed and well nourished in no acute distress HENT normal E scleral and icterus clear Neck Supple JVP flat; carotids brisk and full Clear to ausculation Regular rate and rhythm, no murmurs gallops or rub Soft with active bowel sounds No clubbing cyanosis Trace Edema Alert and oriented, grossly normal motor and sensory function Skin Warm and Dry  ECG demonstrates sinus rhythm at 69 Intervals 17/09/39 Otherwise normal  Assessment and  Plan  Atrial fibrillation/flutter-paroxysmal  Syncope  Cardiomyopathy-presumed nonischemic   The patient has had no interval syncope. He has been thought to be orthostatic.  We await echocardiogram scheduled for next week. This would inform guideline therapy.  We discussed extensively treatment options for his recurrent arrhythmia. He is not infused at this juncture about pursuing ablation. Emergency about the risk of recurrence. He is not inclined for hospitalization to  initially dofetilide. He has been on amiodarone in the past and this would probably be his first choice but for now, he would like to follow the frequency of these events, and try and wait them out when they recur. If they persist for more than 24 hours or if they're associated with symptoms I chest discomfort or shortness of breath he will go to the emergency room. Cardioversion at that juncture would be appropriate.    Current medicines are reviewed at length with the patient today .  The patient does not  have concerns regarding medicines.

## 2017-02-20 NOTE — Patient Instructions (Signed)
Medication Instructions: - Your physician recommends that you continue on your current medications as directed. Please refer to the Current Medication list given to you today.  Labwork: - none ordered  Procedures/Testing: - none ordered  Follow-Up: - Your physician recommends that you schedule a follow-up appointment in: 3 months with Dr. Klein.   Any Additional Special Instructions Will Be Listed Below (If Applicable).     If you need a refill on your cardiac medications before your next appointment, please call your pharmacy.   

## 2017-02-27 ENCOUNTER — Other Ambulatory Visit: Payer: Self-pay

## 2017-02-27 ENCOUNTER — Ambulatory Visit (INDEPENDENT_AMBULATORY_CARE_PROVIDER_SITE_OTHER): Payer: BLUE CROSS/BLUE SHIELD

## 2017-02-27 DIAGNOSIS — I480 Paroxysmal atrial fibrillation: Secondary | ICD-10-CM

## 2017-03-03 ENCOUNTER — Telehealth: Payer: Self-pay

## 2017-03-03 NOTE — Telephone Encounter (Signed)
Pt is aware and agreeable to improved EF. He was very grateful and appreciative of the good news. He states that he feels like he is in and out of a-fob.flutter and was happy to know that it wasn't haven't a negative effect on his heart.

## 2017-03-19 ENCOUNTER — Other Ambulatory Visit: Payer: Self-pay | Admitting: Family Medicine

## 2017-03-31 ENCOUNTER — Telehealth: Payer: Self-pay | Admitting: Cardiovascular Disease

## 2017-03-31 NOTE — Telephone Encounter (Signed)
Pt needs to discuss his Metoprolol dosage. States he was in the ED about 2 weeks aog and they increased to twice a day. He needs a new rx for 90 day supply

## 2017-03-31 NOTE — Telephone Encounter (Signed)
Pt returned your call.  

## 2017-03-31 NOTE — Telephone Encounter (Signed)
No answer. Left message to call back.   

## 2017-04-01 MED ORDER — METOPROLOL SUCCINATE ER 25 MG PO TB24: 25.0000 mg | ORAL_TABLET | Freq: Two times a day (BID) | ORAL | 3 refills | Status: DC

## 2017-04-01 NOTE — Telephone Encounter (Signed)
Spoke with patient and he states that he needs refills on his metoprolol. I see that he is on Metoprolol Succinate 25 mg daily and Metoprolol Tartrate 25 mg twice a day. Patient is not aware of being on both and is just confused on what he should be taking. Will speak with Dr. Olin Pia nurse and once I determine correct dosage then I will call patient and pharmacy back to clarify medications.

## 2017-04-01 NOTE — Telephone Encounter (Signed)
I left a message for the patient to call.   I called the patient's pharmacy on file and was told the last prescription the patient picked up was on 03/19/17 for Metoprolol succinate 25 mg - take 1 tablet by mouth once daily.    Metoprolol tartrate was prescribed at discharge 02/18/17 Discharge Medication List as of 02/18/2017  6:54 PM       START taking these medications   Details  metoprolol tartrate (LOPRESSOR) 25 MG tablet Take 2 tablets (50 mg total) by mouth 2 (two) times daily., Starting Tue 02/18/2017, Until Wed 02/18/2018, Print

## 2017-04-01 NOTE — Telephone Encounter (Signed)
I spoke with the patient- he states he is currently taking metoprolol succinate 25 mg BID and has been since he was in the ER on 02/18/17. He is feeling ok with this dose and denies any dizziness/ lightheadedness- reports he actually feels like "things are more under control" with this dose.  I advised I will review with Dr. Caryl Comes to make sure his is ok with the current dosing- refill needs to [facility] in [facility] for #90 day supply.

## 2017-04-01 NOTE — Telephone Encounter (Signed)
Reviewed with Dr. Caryl Comes - ok for the patient to maintain metoprolol succinate 25 mg BID.

## 2017-04-02 NOTE — Telephone Encounter (Signed)
Spoke with patient and reviewed that medication refill was sent in to his pharmacy of choice and reviewed that it was ok for him to stay on the metoprolol twice a day dosing. He verbalized understanding and was appreciative for the call back. He had no further questions or concerns at this time.

## 2017-04-03 ENCOUNTER — Ambulatory Visit: Payer: BLUE CROSS/BLUE SHIELD | Admitting: Internal Medicine

## 2017-04-14 ENCOUNTER — Other Ambulatory Visit: Payer: Self-pay | Admitting: Family Medicine

## 2017-05-02 ENCOUNTER — Other Ambulatory Visit: Payer: Self-pay | Admitting: Family Medicine

## 2017-05-02 MED ORDER — LISINOPRIL 10 MG PO TABS: 10.0000 mg | ORAL_TABLET | Freq: Every day | ORAL | 1 refills | Status: DC

## 2017-05-02 NOTE — Telephone Encounter (Signed)
Can you call this patient to see how often he is taking the xanax and what he is taking it for? It does not appear that anxiety has been discussed previously. Thanks.

## 2017-05-02 NOTE — Telephone Encounter (Signed)
Refill given. Please fax. Patient reported to CMA that he only occasionally drinks. We reinforced that he should not drink and take this medication at the same time. Scheduled for establish care visit.

## 2017-05-02 NOTE — Telephone Encounter (Signed)
faxed

## 2017-05-02 NOTE — Telephone Encounter (Signed)
Last OV Was 02/18/2017, last refill was 02/12/2017, #60 with 1 refill. Has you listed as PCP but no follow up scheduled. thanks

## 2017-05-02 NOTE — Telephone Encounter (Signed)
Patient states he takes this as needed twice a day for anxiety, it was originally prescribed for quitting alcohol, sleep and anxiety

## 2017-05-29 ENCOUNTER — Ambulatory Visit: Payer: BLUE CROSS/BLUE SHIELD | Admitting: Internal Medicine

## 2017-06-02 ENCOUNTER — Other Ambulatory Visit: Payer: Self-pay | Admitting: Family Medicine

## 2017-06-02 NOTE — Telephone Encounter (Signed)
Patient is scheduled to transfer care to Dr. Caryl Bis 06/24/17 ok to fill alprazolam for 30 days til visit? Last fill was 05/02/17 for 60 no refills.

## 2017-06-03 NOTE — Telephone Encounter (Signed)
Printed, signed and faxed.  

## 2017-06-24 ENCOUNTER — Ambulatory Visit: Payer: BLUE CROSS/BLUE SHIELD | Admitting: Family Medicine

## 2017-07-08 ENCOUNTER — Ambulatory Visit: Payer: BLUE CROSS/BLUE SHIELD | Admitting: Family Medicine

## 2017-07-10 ENCOUNTER — Ambulatory Visit: Payer: BLUE CROSS/BLUE SHIELD | Admitting: Internal Medicine

## 2017-07-11 ENCOUNTER — Encounter: Payer: Self-pay | Admitting: Internal Medicine

## 2017-08-16 ENCOUNTER — Other Ambulatory Visit: Payer: Self-pay | Admitting: Internal Medicine

## 2017-08-18 NOTE — Telephone Encounter (Signed)
Refilled: 06/03/2017 Last OV: 02/18/2017 Next OV: 09/15/2017

## 2017-09-15 ENCOUNTER — Ambulatory Visit: Payer: BLUE CROSS/BLUE SHIELD | Admitting: Family Medicine

## 2017-09-16 ENCOUNTER — Other Ambulatory Visit: Payer: Self-pay | Admitting: Family Medicine

## 2017-09-16 ENCOUNTER — Encounter: Payer: Self-pay | Admitting: Internal Medicine

## 2017-09-16 ENCOUNTER — Ambulatory Visit: Payer: BLUE CROSS/BLUE SHIELD | Admitting: Internal Medicine

## 2017-09-16 VITALS — BP 140/72 | HR 71 | Ht 68.0 in | Wt 234.0 lb

## 2017-09-16 DIAGNOSIS — I428 Other cardiomyopathies: Secondary | ICD-10-CM

## 2017-09-16 DIAGNOSIS — I480 Paroxysmal atrial fibrillation: Secondary | ICD-10-CM

## 2017-09-16 DIAGNOSIS — I483 Typical atrial flutter: Secondary | ICD-10-CM | POA: Diagnosis not present

## 2017-09-16 NOTE — Progress Notes (Signed)
Patient Care Team: Leone Haven, MD as PCP - General (Family Medicine) Minna Merritts, MD as Consulting Physician (Cardiology)   HPI  Derrick Murphy is a 52 y.o. male Seen in follow-up for atrial arrhythmias. He has undergone prior ablation pulmonary veins, cava tricuspid isthmus and roof.  Records and Results Reviewed ECG 7/18 >> narrow QRS tachycardia with flutter 2;1  he is anticoagulated with apixoban  Recurrent syncope none recently   DATE TEST EF   12/16 Echo   25-30 %   8/18 Echo   50-55 %           Date Cr Hgb TSH  7/18 0.65 14.7 1.59         His mother died unfortunately not unexpectedly.  He is dealing with that.  Reminds me last summer that his girlfriend died.  He has sleep disordered breathing and has a CPAP machine which he uses sparingly (;))  Does not have edema chest pain or shortness of breath.  He has had intermittent self-limited palpitations.  Thromboembolic risk factors (, HTN--1. CHF-1) for a CHADSVASc Score of 2.  Past Medical History:  Diagnosis Date  . Alcohol abuse   . Atrial fibrillation ([place])   . CHF (congestive heart failure) ([place])   . Depression   . Tobacco abuse     Past Surgical History:  Procedure Laterality Date  . ABLATION    . CARDIOVERSION      Current Outpatient Medications  Medication Sig Dispense Refill  . ALPRAZolam (XANAX) 0.5 MG tablet TAKE 1 TABLET BY MOUTH 2 TWICE DAILY AS NEEDED FOR ANXIETY 60 tablet 0  . apixaban (ELIQUIS) 5 MG TABS tablet Take 1 tablet (5 mg total) by mouth 2 (two) times daily. 60 tablet 1  . lisinopril (PRINIVIL,ZESTRIL) 10 MG tablet Take 1 tablet (10 mg total) by mouth daily. 30 tablet 1  . magnesium oxide (MAG-OX) 400 MG tablet Take 400 mg by mouth daily.    . metoprolol succinate (TOPROL-XL) 25 MG 24 hr tablet Take 1 tablet (25 mg total) by mouth 2 (two) times daily. 180 tablet 3  . tamsulosin (FLOMAX) 0.4 MG CAPS capsule TAKE 1 CAPSULE (0.4 MG TOTAL) BY MOUTH AT BEDTIME.  90 capsule 0   No current facility-administered medications for this visit.     No Known Allergies    Review of Systems negative except from HPI and PMH  Physical Exam BP 140/72 (BP Location: Left Arm, Patient Position: Sitting, Cuff Size: Large)   Pulse 71   Ht 5\' 8"  (1.727 m)   Wt 234 lb (106.1 kg)   BMI 35.58 kg/m   .Well developed and nourished in no acute distress HENT normal Neck supple with JVP-flat Clear Regular rate and rhythm, no murmurs or gallops Abd-soft with active BS No Clubbing cyanosis edema Skin-warm and dry A & Oriented  Grossly normal sensory and motor function   ECG demonstrates  Sinus rhythm at 71 Interval 16/08/40\   Assessment and  Plan  Atrial fibrillation/flutter-paroxysmal  Syncope  Cardiomyopathy-presumed nonischemic  Psychosocial stress related to the death of his mom   On Anticoagulation;  No bleeding issues   He is concerned about his ability to afford Eliquis.  We reviewed CHADS-VASc data as relates to bleeding risk.  I do not know how to interpret interval normalization of LV function in this regard.  We did review that data points other than age are associated with significantly less risk than age as a  data point.  Having reviewed this, with his score of 2 (minus?)  He would like to continue with his new insurance, and will be about $70 for 3 months.  No interval syncope.  We spent more than 50% of our >25 min visit in face to face counseling regarding the above

## 2017-09-16 NOTE — Patient Instructions (Signed)
Medication Instructions: - Your physician recommends that you continue on your current medications as directed. Please refer to the Current Medication list given to you today.  Labwork: - Your physician recommends that you have lab work today: CBC   Procedures/Testing: - none ordered  Follow-Up: - Your physician wants you to follow-up in: 1 year with Dr. Caryl Comes.You will receive a reminder letter in the mail two months in advance. If you don't receive a letter, please call our office to schedule the follow-up appointment.   Any Additional Special Instructions Will Be Listed Below (If Applicable).     If you need a refill on your cardiac medications before your next appointment, please call your pharmacy.

## 2017-09-17 LAB — CBC WITH DIFFERENTIAL/PLATELET
Basophils Absolute: 0 10*3/uL (ref 0.0–0.2)
Basos: 0 %
EOS (ABSOLUTE): 0 10*3/uL (ref 0.0–0.4)
Eos: 1 %
Hematocrit: 45.5 % (ref 37.5–51.0)
Hemoglobin: 15.5 g/dL (ref 13.0–17.7)
Immature Grans (Abs): 0 10*3/uL (ref 0.0–0.1)
Immature Granulocytes: 0 %
Lymphocytes Absolute: 1.7 10*3/uL (ref 0.7–3.1)
Lymphs: 24 %
MCH: 33.4 pg — ABNORMAL HIGH (ref 26.6–33.0)
MCHC: 34.1 g/dL (ref 31.5–35.7)
MCV: 98 fL — ABNORMAL HIGH (ref 79–97)
Monocytes Absolute: 0.5 10*3/uL (ref 0.1–0.9)
Monocytes: 7 %
Neutrophils Absolute: 4.7 10*3/uL (ref 1.4–7.0)
Neutrophils: 68 %
Platelets: 217 10*3/uL (ref 150–379)
RBC: 4.64 x10E6/uL (ref 4.14–5.80)
RDW: 13.4 % (ref 12.3–15.4)
WBC: 7 10*3/uL (ref 3.4–10.8)

## 2017-09-17 NOTE — Telephone Encounter (Signed)
Last OV with Dr.Cook 02/18/17 last filled by Dr.Sonnenberg 08/19/17 60 0rf patient states he has about a week left. Patient aware Dr.Sonnenberg is out of the office and states he has enough until next week

## 2017-09-18 NOTE — Telephone Encounter (Signed)
Ok to refill. Please call this in to his pharmacy with the directions from the prescription I signed in the chart today. Thanks.

## 2017-09-18 NOTE — Telephone Encounter (Signed)
Called in rx

## 2017-10-04 ENCOUNTER — Emergency Department: Payer: BLUE CROSS/BLUE SHIELD

## 2017-10-04 ENCOUNTER — Emergency Department
Admission: EM | Admit: 2017-10-04 | Discharge: 2017-10-04 | Disposition: A | Discharge status: Home / Self Care | Payer: BLUE CROSS/BLUE SHIELD | Attending: Emergency Medicine | Admitting: Emergency Medicine

## 2017-10-04 ENCOUNTER — Other Ambulatory Visit: Payer: Self-pay

## 2017-10-04 DIAGNOSIS — R079 Chest pain, unspecified: Secondary | ICD-10-CM | POA: Diagnosis present

## 2017-10-04 DIAGNOSIS — F10929 Alcohol use, unspecified with intoxication, unspecified: Secondary | ICD-10-CM

## 2017-10-04 DIAGNOSIS — I110 Hypertensive heart disease with heart failure: Secondary | ICD-10-CM | POA: Insufficient documentation

## 2017-10-04 DIAGNOSIS — I5022 Chronic systolic (congestive) heart failure: Secondary | ICD-10-CM | POA: Diagnosis not present

## 2017-10-04 DIAGNOSIS — F17210 Nicotine dependence, cigarettes, uncomplicated: Secondary | ICD-10-CM | POA: Diagnosis not present

## 2017-10-04 DIAGNOSIS — Z79899 Other long term (current) drug therapy: Secondary | ICD-10-CM | POA: Diagnosis not present

## 2017-10-04 DIAGNOSIS — Z7901 Long term (current) use of anticoagulants: Secondary | ICD-10-CM | POA: Diagnosis not present

## 2017-10-04 LAB — COMPREHENSIVE METABOLIC PANEL
ALT: 52 U/L (ref 17–63)
AST: 61 U/L — ABNORMAL HIGH (ref 15–41)
Albumin: 4.3 g/dL (ref 3.5–5.0)
Alkaline Phosphatase: 83 U/L (ref 38–126)
Anion gap: 12 (ref 5–15)
BUN: 15 mg/dL (ref 6–20)
CO2: 22 mmol/L (ref 22–32)
Calcium: 8.9 mg/dL (ref 8.9–10.3)
Chloride: 104 mmol/L (ref 101–111)
Creatinine, Ser: 0.95 mg/dL (ref 0.61–1.24)
GFR calc Af Amer: >60 mL/min (ref >60)
GFR calc non Af Amer: >60 mL/min (ref >60)
Glucose, Bld: 108 mg/dL — ABNORMAL HIGH (ref 65–99)
Potassium: 4.2 mmol/L (ref 3.5–5.1)
Sodium: 138 mmol/L (ref 135–145)
Total Bilirubin: 0.8 mg/dL (ref 0.3–1.2)
Total Protein: 8.3 g/dL — ABNORMAL HIGH (ref 6.5–8.1)

## 2017-10-04 LAB — CBC WITH DIFFERENTIAL/PLATELET
Basophils Absolute: 0 10*3/uL (ref 0–0.1)
Basophils Relative: 1 %
Eosinophils Absolute: 0.1 10*3/uL (ref 0–0.7)
Eosinophils Relative: 1 %
HCT: 45.6 % (ref 40.0–52.0)
Hemoglobin: 15.8 g/dL (ref 13.0–18.0)
Lymphocytes Relative: 30 %
Lymphs Abs: 2.6 10*3/uL (ref 1.0–3.6)
MCH: 34.2 pg — ABNORMAL HIGH (ref 26.0–34.0)
MCHC: 34.6 g/dL (ref 32.0–36.0)
MCV: 98.8 fL (ref 80.0–100.0)
Monocytes Absolute: 0.8 10*3/uL (ref 0.2–1.0)
Monocytes Relative: 10 %
Neutro Abs: 5.1 10*3/uL (ref 1.4–6.5)
Neutrophils Relative %: 60 %
Platelets: 265 10*3/uL (ref 150–440)
RBC: 4.62 MIL/uL (ref 4.40–5.90)
RDW: 14 % (ref 11.5–14.5)
WBC: 8.7 10*3/uL (ref 3.8–10.6)

## 2017-10-04 LAB — ETHANOL: Alcohol, Ethyl (B): 295 mg/dL — ABNORMAL HIGH (ref <10)

## 2017-10-04 LAB — TROPONIN I: Troponin I: <0.03 ng/mL (ref <0.03)

## 2017-10-04 MED ORDER — FENTANYL CITRATE (PF) 100 MCG/2ML IJ SOLN: 50.0000 ug | Freq: Once | INTRAMUSCULAR | Status: DC

## 2017-10-04 MED ORDER — IOPAMIDOL (ISOVUE-370) INJECTION 76%
75.0000 mL | Freq: Once | INTRAVENOUS | Status: AC | PRN
  Administered 2017-10-04: 75 mL via INTRAVENOUS

## 2017-10-04 NOTE — Discharge Instructions (Signed)
Fortunately today your EKG, your blood work, and your CT scan were reassuring.  Please follow-up with your primary care physician this coming Monday for recheck and return to the emergency department sooner for any concerns.  It was a pleasure to take care of you today, and thank you for coming to our emergency department.  If you have any questions or concerns before leaving please ask the nurse to grab me and I'm more than happy to go through your aftercare instructions again.  If you were prescribed any opioid pain medication today such as Norco, Vicodin, Percocet, morphine, hydrocodone, or oxycodone please make sure you do not drive when you are taking this medication as it can alter your ability to drive safely.  If you have any concerns once you are home that you are not improving or are in fact getting worse before you can make it to your follow-up appointment, please do not hesitate to call 911 and come back for further evaluation.  Darel Hong, MD  Results for orders placed or performed during the hospital encounter of 10/04/17  Comprehensive metabolic panel  Result Value Ref Range   Sodium 138 135 - 145 mmol/L   Potassium 4.2 3.5 - 5.1 mmol/L   Chloride 104 101 - 111 mmol/L   CO2 22 22 - 32 mmol/L   Glucose, Bld 108 (H) 65 - 99 mg/dL   BUN 15 6 - 20 mg/dL   Creatinine, Ser 0.95 0.61 - 1.24 mg/dL   Calcium 8.9 8.9 - 10.3 mg/dL   Total Protein 8.3 (H) 6.5 - 8.1 g/dL   Albumin 4.3 3.5 - 5.0 g/dL   AST 61 (H) 15 - 41 U/L   ALT 52 17 - 63 U/L   Alkaline Phosphatase 83 38 - 126 U/L   Total Bilirubin 0.8 0.3 - 1.2 mg/dL   GFR calc non Af Amer >60 >60 mL/min   GFR calc Af Amer >60 >60 mL/min   Anion gap 12 5 - 15  Troponin I  Result Value Ref Range   Troponin I <0.03 <0.03 ng/mL  CBC with Differential  Result Value Ref Range   WBC 8.7 3.8 - 10.6 K/uL   RBC 4.62 4.40 - 5.90 MIL/uL   Hemoglobin 15.8 13.0 - 18.0 g/dL   HCT 45.6 40.0 - 52.0 %   MCV 98.8 80.0 - 100.0 fL   MCH  34.2 (H) 26.0 - 34.0 pg   MCHC 34.6 32.0 - 36.0 g/dL   RDW 14.0 11.5 - 14.5 %   Platelets 265 150 - 440 K/uL   Neutrophils Relative % 60 %   Neutro Abs 5.1 1.4 - 6.5 K/uL   Lymphocytes Relative 30 %   Lymphs Abs 2.6 1.0 - 3.6 K/uL   Monocytes Relative 10 %   Monocytes Absolute 0.8 0.2 - 1.0 K/uL   Eosinophils Relative 1 %   Eosinophils Absolute 0.1 0 - 0.7 K/uL   Basophils Relative 1 %   Basophils Absolute 0.0 0 - 0.1 K/uL  Ethanol  Result Value Ref Range   Alcohol, Ethyl (B) 295 (H) <10 mg/dL   Ct Angio Chest Pe W/cm &/or Wo Cm  Result Date: 10/04/2017 CLINICAL DATA:  Acute onset of right-sided chest pain. EXAM: CT ANGIOGRAPHY CHEST WITH CONTRAST TECHNIQUE: Multidetector CT imaging of the chest was performed using the standard protocol during bolus administration of intravenous contrast. Multiplanar CT image reconstructions and MIPs were obtained to evaluate the vascular anatomy. CONTRAST:  93mL ISOVUE-370 IOPAMIDOL (ISOVUE-370) INJECTION  76% COMPARISON:  Chest radiograph performed earlier today at 2:58 a.m. FINDINGS: Cardiovascular:  There is no evidence of pulmonary embolus. The heart is normal in size. The thoracic aorta is grossly unremarkable. The great vessels are within normal limits. Mediastinum/Nodes: The mediastinum is unremarkable in appearance. No mediastinal lymphadenopathy is seen. No pericardial effusion is identified. The visualized portions of the thyroid gland are unremarkable. No axillary lymphadenopathy is appreciated. Lungs/Pleura: The lungs are clear bilaterally. No focal consolidation, pleural effusion or pneumothorax is seen. No masses are identified. Upper Abdomen: The visualized portions of the liver and spleen are grossly unremarkable. The visualized portions of the pancreas, gallbladder, adrenal glands and kidneys are grossly unremarkable. Musculoskeletal: No acute osseous abnormalities are identified. The visualized musculature is unremarkable in appearance. Review  of the MIP images confirms the above findings. IMPRESSION: 1. No evidence of pulmonary embolus. 2. Lungs clear bilaterally. Electronically Signed   By: Garald Balding M.D.   On: 10/04/2017 05:04   Dg Chest Port 1 View  Result Date: 10/04/2017 CLINICAL DATA:  Acute onset of right-sided chest pain. EXAM: PORTABLE CHEST 1 VIEW COMPARISON:  Chest radiograph performed 10/20/2015 FINDINGS: The lungs are well-aerated and clear. There is no evidence of focal opacification, pleural effusion or pneumothorax. The cardiomediastinal silhouette is borderline enlarged. No acute osseous abnormalities are seen. IMPRESSION: Borderline cardiomegaly.  Lungs remain grossly clear. Electronically Signed   By: Garald Balding M.D.   On: 10/04/2017 03:26

## 2017-10-04 NOTE — ED Triage Notes (Signed)
Patient to [facility] via EMS from home for chest pain (riight sided) that started between 8 and 9 pm.  Per EMS patient took 3 doses of his metoprolol at home.  EMS gave ASA 81 mg ( x 4) by mouth, HR - 88; BP - 121/76; pulse oxi 97% on room air.

## 2017-10-04 NOTE — ED Provider Notes (Signed)
[facility] Emergency Department Provider Note  ____________________________________________   First MD Initiated Contact with Patient 10/04/17 [phone]     (approximate)  I have reviewed the triage vital signs and the nursing notes.   HISTORY  Chief Complaint Chest Pain    HPI Derrick Murphy is a 52 y.o. male who presents to the emergency department via EMS with atypical chest pain.  Earlier today he had sudden onset severe right anterior chest pain that is constant.  Nonradiating.  No nausea or vomiting.  No diaphoresis.  Nonexertional.  No shortness of breath.  Nothing seems to make the pain better or worse.  He became concerned about the pain given his history of atrial fibrillation so he went inside the house and drank a bottle of pain agrees you prior to calling 911.  He has no history of DVT or pulmonary embolism.  He has history of atrial fibrillation and congestive heart failure and he takes Eliquis daily.  He is currently in no pain.  Past Medical History:  Diagnosis Date  . Alcohol abuse   . Atrial fibrillation ([place])   . CHF (congestive heart failure) ([place])   . Depression   . Tobacco abuse     Patient Active Problem List   Diagnosis Date Noted  . Tachycardia 02/18/2017  . Atrial fibrillation ([place]) 08/12/2016  . Obesity (BMI 30-39.9) 08/12/2016  . History of alcohol abuse 08/12/2016  . Former smoker 08/12/2016  . Chronic systolic CHF (congestive heart failure) ([place]) 08/12/2016  . Benign essential HTN 08/12/2016  . OSA on CPAP 08/12/2016  . BPH (benign prostatic hyperplasia) 08/12/2016    Past Surgical History:  Procedure Laterality Date  . ABLATION    . CARDIOVERSION      Prior to Admission medications   Medication Sig Start Date End Date Taking? Authorizing Provider  ALPRAZolam Duanne Moron) 0.5 MG tablet TAKE 1 TABLET BY MOUTH TWICE DAILY AS NEEDED FOR ANXIETY 09/18/17   Leone Haven, MD  apixaban (ELIQUIS) 5 MG TABS tablet Take 1  tablet (5 mg total) by mouth 2 (two) times daily. 08/12/16   Coral Spikes, DO  lisinopril (PRINIVIL,ZESTRIL) 10 MG tablet Take 1 tablet (10 mg total) by mouth daily. 05/02/17 05/02/18  Leone Haven, MD  magnesium oxide (MAG-OX) 400 MG tablet Take 400 mg by mouth daily.    [provider]  metoprolol succinate (TOPROL-XL) 25 MG 24 hr tablet Take 1 tablet (25 mg total) by mouth 2 (two) times daily. 04/01/17   Deboraha Sprang, MD  tamsulosin (FLOMAX) 0.4 MG CAPS capsule TAKE 1 CAPSULE (0.4 MG TOTAL) BY MOUTH AT BEDTIME. 04/15/17   Coral Spikes, DO    Allergies Patient has no known allergies.  Family History  Problem Relation Age of Onset  . Heart disease Mother   . Heart disease Father   . Heart disease Paternal Uncle     Social History Social History   Tobacco Use  . Smoking status: Current Every Day Smoker    Last attempt to quit: 06/15/2016    Years since quitting: 1.3  . Smokeless tobacco: Never Used  Substance Use Topics  . Alcohol use: Yes    Alcohol/week: 1.2 - 1.8 oz    Types: 2 - 3 Standard drinks or equivalent per week    Comment: occassional  . Drug use: No    Review of Systems Constitutional: No fever/chills Eyes: No visual changes. ENT: No sore throat. Cardiovascular: Positive for chest pain.  Respiratory: Denies shortness of breath. Gastrointestinal: No abdominal pain.  No nausea, no vomiting.  No diarrhea.  No constipation. Genitourinary: Negative for dysuria. Musculoskeletal: Negative for back pain. Skin: Negative for rash. Neurological: Negative for headaches, focal weakness or numbness.   ____________________________________________   PHYSICAL EXAM:  VITAL SIGNS: ED Triage Vitals  Enc Vitals Group     BP      Pulse      Resp      Temp      Temp src      SpO2      Weight      Height      Head Circumference      Peak Flow      Pain Score      Pain Loc      Pain Edu?      Excl. in [place]?     Constitutional: Alert and oriented  x4 heavily intoxicated nontoxic no diaphoresis speaks in full clear sentences Eyes: PERRL EOMI. Head: Atraumatic. Nose: No congestion/rhinnorhea. Mouth/Throat: No trismus Neck: No stridor.   Cardiovascular: Normal rate, regular rhythm. Grossly normal heart sounds.  Good peripheral circulation. Chest wall stable Respiratory: Normal respiratory effort.  No retractions. Lungs CTAB and moving good air Gastrointestinal: Soft nontender Musculoskeletal: No lower extremity edema legs are equal in size Neurologic:  Normal speech and language. No gross focal neurologic deficits are appreciated. Skin:  Skin is warm, dry and intact. No rash noted. Psychiatric: Mood and affect are normal. Speech and behavior are normal.    ____________________________________________   DIFFERENTIAL includes but not limited to  Acute coronary syndrome, aortic dissection, pulmonary embolism, pneumothorax ____________________________________________   LABS (all labs ordered are listed, but only abnormal results are displayed)  Labs Reviewed  COMPREHENSIVE METABOLIC PANEL - Abnormal; Notable for the following components:      Result Value   Glucose, Bld 108 (*)    Total Protein 8.3 (*)    AST 61 (*)    All other components within normal limits  CBC WITH DIFFERENTIAL/PLATELET - Abnormal; Notable for the following components:   MCH 34.2 (*)    All other components within normal limits  ETHANOL - Abnormal; Notable for the following components:   Alcohol, Ethyl (B) 295 (*)    All other components within normal limits  TROPONIN I    Lab work reviewed by me shows elevated ethanol level but no signs of acute ischemia __________________________________________  EKG  ED ECG REPORT I, Darel Hong, the attending physician, personally viewed and interpreted this ECG.  Date: 10/04/2017 EKG Time:  Rate: 75 Rhythm: normal sinus rhythm QRS Axis: normal Intervals: normal ST/T Wave abnormalities:  normal Narrative Interpretation: no evidence of acute ischemia  ____________________________________________  RADIOLOGY  CT angiogram reviewed by me with no acute disease ____________________________________________   PROCEDURES  Procedure(s) performed: no  Procedures  Critical Care performed: no  Observation: no ____________________________________________   INITIAL IMPRESSION / ASSESSMENT AND PLAN / ED COURSE  Pertinent labs & imaging results that were available during my care of the patient were reviewed by me and considered in my medical decision making (see chart for details).  The patient arrives with atypical chest pain.  He is clearly significantly intoxicated.  EKG is nonischemic.  Blood work pending.  He declines pain medications at this time.  The patient's lab work is reassuring as is his chest x-ray.  At this time he says his pain is recurred.  Given his intoxication and difficulty in describing  the pain adequately CT angiogram will be obtained.  CT angios fortunately reassuring with no evidence of PE or aortic dissection.  At this point the patient is now more clinically sober and able to care for himself.  He is discharged home in a taxi in improved condition verbalizes understanding and agreement the plan.        ____________________________________________   FINAL CLINICAL IMPRESSION(S) / ED DIAGNOSES  Final diagnoses:  Chest pain, unspecified type  Alcoholic intoxication with complication [facility])      NEW MEDICATIONS STARTED DURING THIS VISIT:  Discharge Medication List as of 10/04/2017  5:43 AM       Note:  This document was prepared using Dragon voice recognition software and may include unintentional dictation errors.     Darel Hong, MD 10/04/17 [phone]

## 2017-10-17 ENCOUNTER — Ambulatory Visit: Payer: BLUE CROSS/BLUE SHIELD | Admitting: Family Medicine

## 2017-10-17 ENCOUNTER — Encounter: Payer: Self-pay | Admitting: Family Medicine

## 2017-10-17 VITALS — BP 120/64 | HR 96 | Temp 98.0°F | Resp 18 | Ht 68.0 in | Wt 238.2 lb

## 2017-10-17 DIAGNOSIS — F1011 Alcohol abuse, in remission: Secondary | ICD-10-CM

## 2017-10-17 DIAGNOSIS — F419 Anxiety disorder, unspecified: Secondary | ICD-10-CM | POA: Diagnosis not present

## 2017-10-17 DIAGNOSIS — Z87898 Personal history of other specified conditions: Secondary | ICD-10-CM

## 2017-10-17 DIAGNOSIS — L989 Disorder of the skin and subcutaneous tissue, unspecified: Secondary | ICD-10-CM | POA: Diagnosis not present

## 2017-10-17 DIAGNOSIS — I480 Paroxysmal atrial fibrillation: Secondary | ICD-10-CM

## 2017-10-17 MED ORDER — APIXABAN 5 MG PO TABS: 5.0000 mg | ORAL_TABLET | Freq: Two times a day (BID) | ORAL | 1 refills | Status: DC

## 2017-10-17 MED ORDER — ALPRAZOLAM 0.5 MG PO TABS: ORAL_TABLET | ORAL | 0 refills | Status: DC

## 2017-10-17 NOTE — Assessment & Plan Note (Signed)
Patient reports anxiety for which he has been taking Xanax for many years.  I discussed that the combination of continued alcohol use while taking Xanax was unsafe.  I discussed multiple treatment options with him all of which he declined.  I discussed given his continued alcohol use that I would be unable to continue his prior prescription for Xanax.  He was advised that we will be tapering him off of this medication and a prescription was sent to his pharmacy with tapering directions.  I discussed potential withdrawal symptoms and advised that he needed to contact us immediately if those were to occur.
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
## 2017-10-17 NOTE — Progress Notes (Signed)
Pre-visit discussion using our clinic review tool. No additional management support is needed unless otherwise documented below in the visit note.  

## 2017-10-17 NOTE — Assessment & Plan Note (Signed)
Suspect blue nevus on chest though this does need evaluation by dermatology.  Right hand lesion concerning for squamous cell carcinoma or basal cell carcinoma.  We will refer to dermatology.  We will have our referral coordinator try to get him scheduled in the next couple of weeks.

## 2017-10-17 NOTE — Assessment & Plan Note (Signed)
Has been asymptomatic for some time.  Eliquis refill given.  He will continue to follow with cardiology.

## 2017-10-17 NOTE — Assessment & Plan Note (Signed)
Continues to drink 2 beers a day per his report.  Recent alcohol level elevated in the ED.  Discussed that he should cut down.  Advised not to discontinue drinking alcohol all at once as that would put him at risk for withdrawal.

## 2017-10-17 NOTE — Patient Instructions (Signed)
Nice to see you. We will get you in to see dermatology. I refilled your Eliquis. I provided you with a refill of your Xanax to taper down on.  If you develop any withdrawal symptoms such as shakiness, jitteriness, sweatiness, nausea, vomiting, confusion, or any new symptoms please let us know. You should not drink while taking her Xanax.

## 2017-10-27 ENCOUNTER — Other Ambulatory Visit: Payer: Self-pay | Admitting: Family Medicine

## 2017-10-27 NOTE — Telephone Encounter (Signed)
Last OV 10/17/17 last filled 10/17/17 28 0rf

## 2017-10-27 NOTE — Telephone Encounter (Signed)
This medication was prescribed as a taper for the patient previously.  I discussed tapering him off of this medication given his continued alcohol use and advised him that I would not continue to prescribe this medication given his alcohol use. Please contact the pharmacy and see if they initiated this refill or if the patient initiated the refill. If he initiated this and he has not tapered I will refill the taper one time and then there will not be additional refills.

## 2017-10-28 NOTE — Telephone Encounter (Signed)
Spoke with pharmacist and the refill request was an automated request, patient has the alprazolam script on hold for the 28 tablet taper. Pharmacy was advised patient requested can fill taper to taper patient off alprazolam other was any other request for alprazolam would be denied,

## 2017-11-20 ENCOUNTER — Other Ambulatory Visit: Payer: Self-pay | Admitting: Family Medicine

## 2017-12-23 ENCOUNTER — Other Ambulatory Visit: Payer: Self-pay | Admitting: Family Medicine

## 2017-12-23 NOTE — Telephone Encounter (Signed)
Last OV 10/17/17 last filled by Dr.Cook 04/15/17 90 0rf

## 2018-01-19 ENCOUNTER — Ambulatory Visit: Payer: BLUE CROSS/BLUE SHIELD | Admitting: Family Medicine

## 2018-01-19 DIAGNOSIS — Z0289 Encounter for other administrative examinations: Secondary | ICD-10-CM

## 2018-02-12 ENCOUNTER — Other Ambulatory Visit: Payer: Self-pay | Admitting: Family Medicine

## 2018-03-03 ENCOUNTER — Telehealth: Payer: Self-pay

## 2018-03-03 MED ORDER — METOPROLOL SUCCINATE ER 25 MG PO TB24: 25.0000 mg | ORAL_TABLET | Freq: Two times a day (BID) | ORAL | 3 refills | Status: DC

## 2018-03-03 NOTE — Telephone Encounter (Signed)
Refill request for metoprolol 25 mg pt 90 day with 2 refills sent to [facility].

## 2018-03-10 ENCOUNTER — Other Ambulatory Visit: Payer: Self-pay | Admitting: Family Medicine

## 2018-04-08 ENCOUNTER — Other Ambulatory Visit: Payer: Self-pay | Admitting: Family Medicine

## 2018-04-12 ENCOUNTER — Encounter: Payer: Self-pay | Admitting: Family Medicine

## 2018-04-13 ENCOUNTER — Encounter: Payer: Self-pay | Admitting: Family Medicine

## 2018-04-13 ENCOUNTER — Ambulatory Visit
Admission: RE | Admit: 2018-04-13 | Discharge: 2018-04-13 | Disposition: A | Discharge status: Home / Self Care | Payer: BLUE CROSS/BLUE SHIELD | Source: Ambulatory Visit | Attending: Family Medicine | Admitting: Family Medicine

## 2018-04-13 ENCOUNTER — Telehealth: Payer: Self-pay | Admitting: Family Medicine

## 2018-04-13 ENCOUNTER — Ambulatory Visit: Payer: BLUE CROSS/BLUE SHIELD | Admitting: Family Medicine

## 2018-04-13 ENCOUNTER — Other Ambulatory Visit: Payer: Self-pay | Admitting: Family Medicine

## 2018-04-13 ENCOUNTER — Ambulatory Visit (INDEPENDENT_AMBULATORY_CARE_PROVIDER_SITE_OTHER): Payer: BLUE CROSS/BLUE SHIELD

## 2018-04-13 VITALS — BP 142/80 | HR 69 | Temp 98.7°F | Ht 68.0 in | Wt 245.4 lb

## 2018-04-13 DIAGNOSIS — M2550 Pain in unspecified joint: Secondary | ICD-10-CM

## 2018-04-13 DIAGNOSIS — K7460 Unspecified cirrhosis of liver: Secondary | ICD-10-CM

## 2018-04-13 DIAGNOSIS — R1031 Right lower quadrant pain: Secondary | ICD-10-CM

## 2018-04-13 DIAGNOSIS — K5732 Diverticulitis of large intestine without perforation or abscess without bleeding: Secondary | ICD-10-CM | POA: Diagnosis not present

## 2018-04-13 DIAGNOSIS — K5792 Diverticulitis of intestine, part unspecified, without perforation or abscess without bleeding: Secondary | ICD-10-CM

## 2018-04-13 DIAGNOSIS — M20001 Unspecified deformity of right finger(s): Secondary | ICD-10-CM

## 2018-04-13 DIAGNOSIS — I700 Atherosclerosis of aorta: Secondary | ICD-10-CM | POA: Diagnosis not present

## 2018-04-13 HISTORY — DX: Essential (primary) hypertension: I10

## 2018-04-13 LAB — COMPREHENSIVE METABOLIC PANEL
ALT: 21 U/L (ref 0–53)
AST: 18 U/L (ref 0–37)
Albumin: 4.2 g/dL (ref 3.5–5.2)
Alkaline Phosphatase: 77 U/L (ref 39–117)
BUN: 19 mg/dL (ref 6–23)
CO2: 32 mEq/L (ref 19–32)
Calcium: 9.3 mg/dL (ref 8.4–10.5)
Chloride: 100 mEq/L (ref 96–112)
Creatinine, Ser: 1.3 mg/dL (ref 0.40–1.50)
GFR: 61.74 mL/min (ref >60.00)
Glucose, Bld: 91 mg/dL (ref 70–99)
Potassium: 4.4 mEq/L (ref 3.5–5.1)
Sodium: 137 mEq/L (ref 135–145)
Total Bilirubin: 0.4 mg/dL (ref 0.2–1.2)
Total Protein: 7.4 g/dL (ref 6.0–8.3)

## 2018-04-13 LAB — CBC WITH DIFFERENTIAL/PLATELET
Basophils Absolute: 0.1 10*3/uL (ref 0.0–0.1)
Basophils Relative: 0.8 % (ref 0.0–3.0)
Eosinophils Absolute: 0.3 10*3/uL (ref 0.0–0.7)
Eosinophils Relative: 3.3 % (ref 0.0–5.0)
HCT: 42.4 % (ref 39.0–52.0)
Hemoglobin: 14.5 g/dL (ref 13.0–17.0)
Lymphocytes Relative: 28.5 % (ref 12.0–46.0)
Lymphs Abs: 2.4 10*3/uL (ref 0.7–4.0)
MCHC: 34.2 g/dL (ref 30.0–36.0)
MCV: 97.3 fl (ref 78.0–100.0)
Monocytes Absolute: 0.7 10*3/uL (ref 0.1–1.0)
Monocytes Relative: 8.6 % (ref 3.0–12.0)
Neutro Abs: 4.9 10*3/uL (ref 1.4–7.7)
Neutrophils Relative %: 58.8 % (ref 43.0–77.0)
Platelets: 272 10*3/uL (ref 150.0–400.0)
RBC: 4.36 Mil/uL (ref 4.22–5.81)
RDW: 13.1 % (ref 11.5–15.5)
WBC: 8.3 10*3/uL (ref 4.0–10.5)

## 2018-04-13 LAB — POCT I-STAT CREATININE: Creatinine, Ser: 1.2 mg/dL (ref 0.61–1.24)

## 2018-04-13 MED ORDER — IOPAMIDOL (ISOVUE-300) INJECTION 61%
125.0000 mL | Freq: Once | INTRAVENOUS | Status: AC | PRN
  Administered 2018-04-13: 125 mL via INTRAVENOUS

## 2018-04-13 MED ORDER — AMOXICILLIN-POT CLAVULANATE 875-125 MG PO TABS: 1.0000 | ORAL_TABLET | Freq: Three times a day (TID) | ORAL | 0 refills | Status: AC

## 2018-04-13 NOTE — Telephone Encounter (Signed)
Spoke with patient, he is aware of CT results. Augmentin sent to pharmacy for diverticulitis.    He is aware radiologist noted early suspected liver cirrhosis on CT scan, lease call to have him follow up in 2-3 months for monitoring of liver functions/liver imaging.   We discussed eating healthy diet, avoiding alcohol.  LG

## 2018-04-13 NOTE — Progress Notes (Signed)
Subjective:    Patient ID: Derrick Murphy, male    DOB: [birth year], 52 y.o.   MRN: [id number]  HPI   Patient presents to clinic due to right lower quadrant abdominal pain for the past 2 days.  Patient states pain seemed worse on Saturday night, slightly better today.  Denies any fever or chills.  Denies any nausea, vomiting, diarrhea.  States his appetite has been fairly regular.   Patient states he used to drink alcohol daily, but now drinks maybe 2-3 beers per week.  Patient is concerned about his liver, would also like liver functions checked.  Also complains of multiple joint pains hands, elbows, shoulders, hips,low back, knees.  States he has family history of mood arthritis I would like this further evaluated.  Currently uses Tylenol on occasion for the joint pain control.  Injured right finger many years ago, states that healed incorrectly and now stuck in a bent position.  Patient Active Problem List   Diagnosis Date Noted  . Skin lesion 10/17/2017  . Anxiety 10/17/2017  . Tachycardia 02/18/2017  . Atrial fibrillation ([place]) 08/12/2016  . Obesity (BMI 30-39.9) 08/12/2016  . History of alcohol abuse 08/12/2016  . Former smoker 08/12/2016  . Chronic systolic CHF (congestive heart failure) ([place]) 08/12/2016  . Benign essential HTN 08/12/2016  . OSA on CPAP 08/12/2016  . BPH (benign prostatic hyperplasia) 08/12/2016   Social History   Tobacco Use  . Smoking status: Former Smoker    Last attempt to quit: 06/15/2016    Years since quitting: 1.8  . Smokeless tobacco: Never Used  Substance Use Topics  . Alcohol use: Yes    Alcohol/week: 2.0 - 3.0 standard drinks    Types: 2 - 3 Standard drinks or equivalent per week    Comment: occassional   Review of Systems  Constitutional: Negative for chills, fatigue and fever.  HENT: Negative for congestion, ear pain, sinus pain and sore throat.   Eyes: Negative.   Respiratory: Negative for cough, shortness of breath and wheezing.     Cardiovascular: Negative for chest pain, palpitations and leg swelling.  Gastrointestinal: +RLQ abdominal pain. Negative for diarrhea, nausea and vomiting.  Genitourinary: Negative for dysuria, frequency and urgency.  Musculoskeletal: Multiple joint pains. Right 5th finger, stuck in bent position.  Skin: Negative for color change, pallor and rash.  Neurological: Negative for syncope, light-headedness and headaches.  Psychiatric/Behavioral: The patient is not nervous/anxious.    Objective:   Physical Exam  Constitutional: He is oriented to person, place, and time.  Non-toxic appearance. He does not appear ill. No distress.  HENT:  Head: Normocephalic and atraumatic.  Eyes: EOM are normal. No scleral icterus.  Cardiovascular: Normal rate and regular rhythm.  Pulmonary/Chest: Effort normal and breath sounds normal. No respiratory distress.  Abdominal: There is tenderness in the right lower quadrant. There is guarding.  Obese ABD.   Musculoskeletal:  Multiple joint aches/pains. Right 5th finger stuck in bet position.   Neurological: He is alert and oriented to person, place, and time.  Skin: Skin is warm and dry. He is not diaphoretic. No pallor.  Psychiatric: He has a normal mood and affect. His behavior is normal.  Nursing note and vitals reviewed.  Vitals:   04/13/18 1344  BP: (!) 142/80  Pulse: 69  Temp: 98.7 F (37.1 C)  SpO2: 95%      Assessment & Plan:    RLQ ABD pain - We will get CT scan to rule out acute appendicitis and  also to further evaluate abdominal organs.  CBC and complete metabolic panel lab work also collected.  Pending results of these studies will determine next step in plan of care.  Multiple Joint pain --aches and pains in multiple joints, suspect this is related to osteoarthritis.  Patient does have family history of rheumatoid arthritis so we will include ANA and rheumatoid factor in her lab work.  Patient advised he can use Tylenol as needed for pain  control, also encouraged to keep joints active via regular physical activity.  Right fifth finger deformity-patient states he injured right fifth finger many years ago, it healed in a bent position.  We will get x-ray to further evaluate.  Discussed most likely he will need referral to hand specialist for further treatment options.    *Update*   CT scan is negative for acute appendicitis.  However it does show an acute diverticulitis, patient will take Augmentin 3 times daily for 7 days as recommended by up-to-date for diverticulitis treatment.  Discussed eating bland diet for the next 5 to 7 days, then can slowly advance diet as tolerated.  Patient also aware that radiologist did note an early suspected liver cirrhosis on his CT scan imaging.  Patient will continue to work on healthy diet, getting more exercise, and cutting\cutting out alcohol intake.  Patient will make follow up appt in 2-3 months for recheck on chronic issues, liver monitoring.

## 2018-04-13 NOTE — Telephone Encounter (Signed)
CT results

## 2018-04-13 NOTE — Telephone Encounter (Signed)
Copied from [place] [id number]. Topic: Quick Communication - See Telephone Encounter >> Apr 13, 2018  4:47 PM Blase Mess A wrote: CRM for notification. See Telephone encounter for: 04/13/18. Christin, [place] [facility] is calling with results they are in [place]

## 2018-04-13 NOTE — Progress Notes (Signed)
Augmentin to treat diverticulitis -- TID dosing as recommended by [employer]

## 2018-04-13 NOTE — Telephone Encounter (Signed)
Sent to [name] 

## 2018-04-14 ENCOUNTER — Encounter: Payer: Self-pay | Admitting: Family Medicine

## 2018-04-14 ENCOUNTER — Other Ambulatory Visit: Payer: Self-pay | Admitting: Family Medicine

## 2018-04-14 DIAGNOSIS — M2550 Pain in unspecified joint: Secondary | ICD-10-CM | POA: Insufficient documentation

## 2018-04-14 DIAGNOSIS — M20001 Unspecified deformity of right finger(s): Secondary | ICD-10-CM | POA: Insufficient documentation

## 2018-04-14 DIAGNOSIS — K7460 Unspecified cirrhosis of liver: Secondary | ICD-10-CM | POA: Insufficient documentation

## 2018-04-14 NOTE — Progress Notes (Unsigned)
Hand surgery referral due deformity of right 5th finger

## 2018-04-14 NOTE — Telephone Encounter (Signed)
Last OV 10/17/2017   Last refilled 12/23/2017 disp 90 with no refills   Sent to PCP for approval

## 2018-04-15 LAB — ANA: Anti Nuclear Antibody(ANA): NEGATIVE

## 2018-04-15 LAB — RHEUMATOID FACTOR: Rhuematoid fact SerPl-aCnc: <14 IU/mL (ref <14)

## 2018-04-16 NOTE — Telephone Encounter (Signed)
Called and left voicemail for patient to call office back to scheduled 2-3 month follow up visit with Dr. Caryl Bis. Ok for [facility] to schedule appt .

## 2018-04-24 ENCOUNTER — Encounter: Payer: Self-pay | Admitting: Family Medicine

## 2018-04-24 DIAGNOSIS — K5792 Diverticulitis of intestine, part unspecified, without perforation or abscess without bleeding: Secondary | ICD-10-CM

## 2018-04-24 MED ORDER — AMOXICILLIN-POT CLAVULANATE 875-125 MG PO TABS: 1.0000 | ORAL_TABLET | Freq: Two times a day (BID) | ORAL | 0 refills | Status: AC

## 2018-04-24 NOTE — Telephone Encounter (Signed)
Patient is calling in wanting to know if a refill can be placed amoxicillin-clavulanate (AUGMENTIN) 875-125 MG tablet due to issue not resolved. May leave detailed voicemail due to patient being at work

## 2018-04-24 NOTE — Telephone Encounter (Signed)
Please call to let patient know I will do Rx, if symptoms persist after this he needs to come in for appt  Also take probiotic/eat yogurt daily to avoid antibiotic associated diarrhea

## 2018-04-27 NOTE — Telephone Encounter (Signed)
Spoke with patient he states that symptoms seem to be improved.

## 2018-04-27 NOTE — Telephone Encounter (Signed)
Please call pt  How is doing on second round of antibiotics?  Is he feeling better?

## 2018-05-05 ENCOUNTER — Other Ambulatory Visit: Payer: Self-pay | Admitting: Family Medicine

## 2018-05-10 ENCOUNTER — Other Ambulatory Visit: Payer: Self-pay | Admitting: Family Medicine

## 2018-05-21 ENCOUNTER — Telehealth: Payer: Self-pay | Admitting: Family Medicine

## 2018-05-21 NOTE — Telephone Encounter (Signed)
Noted. The patient needs to be seen to determine the correct diagnosis, appropriate work up, and appropriate treatment. These things can not be determined in a safe and appropriate manner without him being evaluated. He could be offered an appointment in the office if he does not want to go to the ED for evaluation.

## 2018-05-21 NOTE — Telephone Encounter (Signed)
Sent to PCP please advise.   Thanks  Last OV was back in September

## 2018-05-21 NOTE — Telephone Encounter (Signed)
Called and spoke with patient. Pt was very dissatisfied did not understand what the need for another OV or need to go to the ER and spend thousands of dollars to be told he has diverticulitis. Pt stated that he has had a CT scans done he has been Dx with this before. He just doesn't understand why he can't just have the antibiotic sent into his pharmacy. Pt stated that he is in a lot of pain and can NOT afford to miss work.   Pt seemed frustrated and disappointed. He stated that he will discuss with someone in our office about switching to another provider.   I did offer the patient a appt with Philis Nettle and he hung up the phone.

## 2018-05-21 NOTE — Telephone Encounter (Signed)
Copied from [place] [id number]. Topic: General - Other >> May 21, 2018  8:42 AM Cecelia Byars, NT wrote: Reason for CRM: Patient called and said he is having the same symptoms of diverticulitis ,he says he is in a lot of pain and would like an antibiotic called in to the  [facility] [address], [place] [phone] (Phone) [phone] (Fax  , He would like a call if he needs to come in today to get the medication as soon as possible  at [phone]

## 2018-05-21 NOTE — Telephone Encounter (Signed)
He needs to be evaluated today at urgent Care or in the ED. If he is having severe pain he needs to go to the ED.

## 2018-05-22 NOTE — Telephone Encounter (Signed)
Called pt and left a detailed VM that Dr. Caryl Bis would like for him to be seen by Philis Nettle today or go to ER if he is unable to be seen here. Left our call back number asking patient to call to be set up for an appt if that is what they would like.

## 2018-06-02 ENCOUNTER — Ambulatory Visit: Payer: BLUE CROSS/BLUE SHIELD | Admitting: Family Medicine

## 2018-06-02 ENCOUNTER — Encounter: Payer: Self-pay | Admitting: Family Medicine

## 2018-06-02 VITALS — BP 118/62 | HR 89 | Temp 98.5°F | Ht 68.0 in | Wt 247.8 lb

## 2018-06-02 DIAGNOSIS — K5792 Diverticulitis of intestine, part unspecified, without perforation or abscess without bleeding: Secondary | ICD-10-CM

## 2018-06-02 DIAGNOSIS — R109 Unspecified abdominal pain: Secondary | ICD-10-CM | POA: Diagnosis not present

## 2018-06-02 DIAGNOSIS — M62838 Other muscle spasm: Secondary | ICD-10-CM | POA: Diagnosis not present

## 2018-06-02 DIAGNOSIS — M545 Low back pain, unspecified: Secondary | ICD-10-CM

## 2018-06-02 LAB — CBC WITH DIFFERENTIAL/PLATELET
Basophils Absolute: 0 10*3/uL (ref 0.0–0.1)
Basophils Relative: 0.5 % (ref 0.0–3.0)
Eosinophils Absolute: 0.1 10*3/uL (ref 0.0–0.7)
Eosinophils Relative: 0.6 % (ref 0.0–5.0)
HCT: 43.9 % (ref 39.0–52.0)
Hemoglobin: 14.8 g/dL (ref 13.0–17.0)
Lymphocytes Relative: 21.1 % (ref 12.0–46.0)
Lymphs Abs: 2.2 10*3/uL (ref 0.7–4.0)
MCHC: 33.6 g/dL (ref 30.0–36.0)
MCV: 95.3 fl (ref 78.0–100.0)
Monocytes Absolute: 0.7 10*3/uL (ref 0.1–1.0)
Monocytes Relative: 6.5 % (ref 3.0–12.0)
Neutro Abs: 7.6 10*3/uL (ref 1.4–7.7)
Neutrophils Relative %: 71.3 % (ref 43.0–77.0)
Platelets: 321 10*3/uL (ref 150.0–400.0)
RBC: 4.61 Mil/uL (ref 4.22–5.81)
RDW: 12.9 % (ref 11.5–15.5)
WBC: 10.6 10*3/uL — ABNORMAL HIGH (ref 4.0–10.5)

## 2018-06-02 LAB — COMPREHENSIVE METABOLIC PANEL
ALT: 20 U/L (ref 0–53)
AST: 18 U/L (ref 0–37)
Albumin: 4.5 g/dL (ref 3.5–5.2)
Alkaline Phosphatase: 70 U/L (ref 39–117)
BUN: 22 mg/dL (ref 6–23)
CO2: 31 mEq/L (ref 19–32)
Calcium: 9.6 mg/dL (ref 8.4–10.5)
Chloride: 102 mEq/L (ref 96–112)
Creatinine, Ser: 1.18 mg/dL (ref 0.40–1.50)
GFR: 69 mL/min (ref >60.00)
Glucose, Bld: 112 mg/dL — ABNORMAL HIGH (ref 70–99)
Potassium: 4.6 mEq/L (ref 3.5–5.1)
Sodium: 139 mEq/L (ref 135–145)
Total Bilirubin: 0.3 mg/dL (ref 0.2–1.2)
Total Protein: 7.5 g/dL (ref 6.0–8.3)

## 2018-06-02 MED ORDER — CYCLOBENZAPRINE HCL 5 MG PO TABS: 5.0000 mg | ORAL_TABLET | Freq: Three times a day (TID) | ORAL | 1 refills | Status: DC | PRN

## 2018-06-02 MED ORDER — AMOXICILLIN-POT CLAVULANATE 875-125 MG PO TABS: 1.0000 | ORAL_TABLET | Freq: Three times a day (TID) | ORAL | 0 refills | Status: AC

## 2018-06-02 NOTE — Progress Notes (Signed)
Subjective:    Patient ID: Derrick Murphy, male    DOB: [birth year], 52 y.o.   MRN: [id number]  HPI  Patient presents to clinic c/o ABD pain along the right side for past week or so.  Patient had similar episode back in September, we did CT scan which ended up showing diverticulitis.  He was treated with Augmentin course.  CT scan did also show possible beginnings of liver cirrhosis.  Blood work at that time did not reveal any liver abnormalities.  Patient denies diarrhea, nausea or vomiting.  Denies fever or chills. BMs normal. Urine normal.   Patient does also report a pulled muscle in his low back on the right side as well.  States certain motions he will feel a pulling sensation.  Believes his pain is another diverticulitis flare up.    CT scan 04/13/18 LOWER CHEST: Lung bases are clear. Included heart size is normal. No pericardial effusion.  HEPATOBILIARY: Mildly enlarged caudate and LEFT lobe of the liver with slightly nodular contour. Normal gallbladder.  PANCREAS: Normal.  SPLEEN: Normal.  ADRENALS/URINARY TRACT: Kidneys are orthotopic, demonstrating symmetric enhancement. No nephrolithiasis, hydronephrosis or solid renal masses. The unopacified ureters are normal in course and caliber. Delayed imaging through the kidneys demonstrates symmetric prompt contrast excretion within the proximal urinary collecting system. Urinary bladder is partially distended and unremarkable. Normal adrenal glands.  STOMACH/BOWEL: Enteric contrast has not yet reached the distal small bowel. Mild colonic diverticulosis with superimposed acute anterior cecal diverticulitis. Normal appendix. The stomach, small and large bowel are normal in course and caliber.  VASCULAR/LYMPHATIC: Aortoiliac vessels are normal in course and caliber. Mild calcific atherosclerosis. No lymphadenopathy by CT size criteria.  REPRODUCTIVE: Normal.  OTHER: No intraperitoneal free fluid or free air. Small  LEFT greater than RIGHT fat containing inguinal hernias.  MUSCULOSKELETAL: Nonacute. Mild geographic sclerosis bilateral femoral heads. Minimal grade 1 L5-S1 anterolisthesis on the basis of bilateral chronic L5 pars interarticularis defects.  IMPRESSION: 1. Acute mild cecal diverticulitis without complication. Normal appendix. 2. Early suspected cirrhosis. 3. 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 4. These results will be called to the ordering clinician or representative by the Radiology Department at the imaging location  CBC Latest Ref Rng & Units 04/13/2018 10/04/2017 09/16/2017  WBC 4.0 - 10.5 K/uL 8.3 8.7 7.0  Hemoglobin 13.0 - 17.0 g/dL 14.5 15.8 15.5  Hematocrit 39.0 - 52.0 % 42.4 45.6 45.5  Platelets 150.0 - 400.0 K/uL 272.0 265 217   CMP Latest Ref Rng & Units 04/13/2018 04/13/2018 10/04/2017  Glucose 70 - 99 mg/dL - 91 108(H)  BUN 6 - 23 mg/dL - 19 15  Creatinine 0.61 - 1.24 mg/dL 1.20 1.30 0.95  Sodium 135 - 145 mEq/L - 137 138  Potassium 3.5 - 5.1 mEq/L - 4.4 4.2  Chloride 96 - 112 mEq/L - 100 104  CO2 19 - 32 mEq/L - 32 22  Calcium 8.4 - 10.5 mg/dL - 9.3 8.9  Total Protein 6.0 - 8.3 g/dL - 7.4 8.3(H)  Total Bilirubin 0.2 - 1.2 mg/dL - 0.4 0.8  Alkaline Phos 39 - 117 U/L - 77 83  AST 0 - 37 U/L - 18 61(H)  ALT 0 - 53 U/L - 21 52    Patient Active Problem List   Diagnosis Date Noted  . Hepatic cirrhosis ([place]) 04/14/2018  . Finger deformity, acquired, right 04/14/2018  . Multiple joint pain 04/14/2018  . Skin lesion 10/17/2017  . Anxiety 10/17/2017  . Tachycardia  02/18/2017  . Atrial fibrillation ([place]) 08/12/2016  . Obesity (BMI 30-39.9) 08/12/2016  . History of alcohol abuse 08/12/2016  . Former smoker 08/12/2016  . Chronic systolic CHF (congestive heart failure) ([place]) 08/12/2016  . Benign essential HTN 08/12/2016  . OSA on CPAP 08/12/2016  . BPH (benign prostatic hyperplasia) 08/12/2016   Social History   Tobacco  Use  . Smoking status: Former Smoker    Last attempt to quit: 06/15/2016    Years since quitting: 1.9  . Smokeless tobacco: Never Used  Substance Use Topics  . Alcohol use: Yes    Alcohol/week: 2.0 - 3.0 standard drinks    Types: 2 - 3 Standard drinks or equivalent per week    Comment: occassional   Review of Systems  Constitutional: Negative for chills, fatigue and fever.  HENT: Negative for congestion, ear pain, sinus pain and sore throat.   Eyes: Negative.   Respiratory: Negative for cough, shortness of breath and wheezing.   Cardiovascular: Negative for chest pain, palpitations and leg swelling.  Gastrointestinal: +right sided abd pain. Negative for diarrhea, nausea and vomiting.  Genitourinary: Negative for dysuria, frequency and urgency.  Musculoskeletal: right sided low back pain Skin: Negative for color change, pallor and rash.  Neurological: Negative for syncope, light-headedness and headaches.  Psychiatric/Behavioral: The patient is not nervous/anxious.       Objective:   Physical Exam  Constitutional: He is oriented to person, place, and time. He appears well-nourished. No distress.  HENT:  Head: Normocephalic and atraumatic.  Eyes: Conjunctivae and EOM are normal. No scleral icterus.  Neck: Neck supple. No JVD present. No tracheal deviation present.  Cardiovascular: Normal rate, regular rhythm and normal heart sounds.  Pulmonary/Chest: Effort normal and breath sounds normal. No respiratory distress.  Abdominal: Soft. Bowel sounds are normal. There is tenderness. There is no rebound and no guarding.    Obese ABD, +generalized right sided ABD pain  Musculoskeletal: He exhibits no edema.  Right sided paraspinal muscle tightness.  Neurological: He is alert and oriented to person, place, and time.  Skin: Skin is warm and dry. He is not diaphoretic. No pallor.  Nursing note and vitals reviewed.     Vitals:   06/02/18 1332  BP: 118/62  Pulse: 89  Temp: 98.5 F  (36.9 C)  SpO2: 96%   Assessment & Plan:   Right-sided abdominal pain/diverticulitis- pain in right side is present again, but this is been the same area patient had pain when CT scan revealed diverticulitis in September.  Patient will take Augmentin course for 7 days and also get a repeat CT scan.  Patient also is agreeable to see GI due to continued episodes of abdominal pain & early suspected cirrhosis seen on previous scan.  Muscle spasm/right-sided low back pain- patient will take muscle relaxer as needed to calm low back pain.  Discussed stretching exercises to do to help soothe muscles and low back.  Patient advised he can try topical rubs like Biofreeze or BenGay to help improve pain.  Patient aware he will be contacted regarding his lab results, his GI appointment and CT scan appointment.  Patient aware that if pain becomes severe, he develops vomiting, diarrhea he is to call office right away and/or go to emergency room for evaluation.

## 2018-06-03 ENCOUNTER — Encounter: Payer: Self-pay | Admitting: *Deleted

## 2018-06-05 ENCOUNTER — Other Ambulatory Visit: Payer: Self-pay | Admitting: Family Medicine

## 2018-06-08 ENCOUNTER — Telehealth: Payer: Self-pay | Admitting: Family Medicine

## 2018-06-08 NOTE — Telephone Encounter (Signed)
Please advise 

## 2018-06-08 NOTE — Telephone Encounter (Signed)
Phone number was given to patient along with appt information if he needed to reschedule. Sent him another message with the phone number to reschedule to [facility].

## 2018-06-08 NOTE — Telephone Encounter (Signed)
Pt requesting to have CT Scan done at the hospital instead of [facility] preferably on a Monday.   Please call pt to confirm.  [phone]

## 2018-06-10 ENCOUNTER — Ambulatory Visit: Payer: BLUE CROSS/BLUE SHIELD

## 2018-06-10 ENCOUNTER — Encounter: Payer: Self-pay | Admitting: Family Medicine

## 2018-06-11 ENCOUNTER — Ambulatory Visit: Payer: BLUE CROSS/BLUE SHIELD

## 2018-06-16 ENCOUNTER — Telehealth: Payer: Self-pay

## 2018-06-16 ENCOUNTER — Ambulatory Visit
Admission: RE | Admit: 2018-06-16 | Discharge: 2018-06-16 | Disposition: A | Discharge status: Home / Self Care | Payer: BLUE CROSS/BLUE SHIELD | Source: Ambulatory Visit | Attending: Family Medicine | Admitting: Family Medicine

## 2018-06-16 DIAGNOSIS — R109 Unspecified abdominal pain: Secondary | ICD-10-CM

## 2018-06-16 MED ORDER — IOPAMIDOL (ISOVUE-300) INJECTION 61%
100.0000 mL | Freq: Once | INTRAVENOUS | Status: AC | PRN
  Administered 2018-06-16: 100 mL via INTRAVENOUS

## 2018-06-16 NOTE — Telephone Encounter (Signed)
Copied from [place] [phone]. Topic: General - Other >> Jun 16, 2018  1:55 PM Rayann Heman wrote: Reason for CRM: pt calling to check if results are ready from ct scan done today 06/16/18. Please advise

## 2018-06-17 ENCOUNTER — Encounter: Payer: Self-pay | Admitting: Family Medicine

## 2018-06-17 NOTE — Telephone Encounter (Signed)
Result reviewed today 11/27 and my chart message sent. Scan was done 11/26.

## 2018-06-17 NOTE — Addendum Note (Signed)
Addended by: Philis Nettle on: 06/17/2018 12:13 PM   Modules accepted: Orders

## 2018-06-17 NOTE — Telephone Encounter (Signed)
Copied from [place] [phone]. Topic: General - Other >> Jun 16, 2018  1:55 PM Rayann Heman wrote: Reason for CRM: pt calling to check if results are ready from ct scan done today 06/16/18. Please advise

## 2018-06-26 ENCOUNTER — Telehealth: Payer: Self-pay | Admitting: *Deleted

## 2018-06-26 DIAGNOSIS — M87059 Idiopathic aseptic necrosis of unspecified femur: Secondary | ICD-10-CM

## 2018-06-26 NOTE — Telephone Encounter (Signed)
Copied from [place] [phone]. Topic: General - Other >> Jun 26, 2018 11:03 AM Yvette Rack wrote: Reason for CRM: Patient called in stating he has not been contacted for scheduling of the MRI. Patient stated he is still in lots of pain and would like to have the appt scheduled as soon as possible. Cb# [phone]

## 2018-06-29 NOTE — Telephone Encounter (Signed)
I called to try and get it approved with the ins it did not get approved. So I spoke with Lauren for her to call and speak with the ins provider so she can give a more better reason for it to get approved. Thank you!

## 2018-06-30 NOTE — Telephone Encounter (Signed)
Spoke with insurance  MRI lumbar spine approved. Cancel Hip MRIs and do pelvis instead - pelvis MRI approved  Approval # [id number]

## 2018-07-01 ENCOUNTER — Other Ambulatory Visit: Payer: Self-pay | Admitting: Gastroenterology

## 2018-07-01 DIAGNOSIS — K7030 Alcoholic cirrhosis of liver without ascites: Secondary | ICD-10-CM

## 2018-07-03 ENCOUNTER — Telehealth: Payer: Self-pay | Admitting: Cardiovascular Disease

## 2018-07-03 NOTE — Telephone Encounter (Signed)
° °  [facility] Pre-operative Risk Assessment    Request for surgical clearance:  1. What type of surgery is being performed? Colonoscopy   2. When is this surgery scheduled? 07/08/18   3. What type of clearance is required (medical clearance vs. Pharmacy clearance to hold med vs. Both)? Both  4. Are there any medications that need to be held prior to surgery and how long? Eliquis, how many days prior   5. Practice name and name of physician performing surgery? [facility], Dr Alice Reichert   6. What is your office phone number [phone]    7.   What is your office fax number [phone]  8.   Anesthesia type (None, local, MAC, general) ? Monitored sedated   Ace Gins 07/03/2018, 8:04 AM  _________________________________________________________________   (provider comments below)

## 2018-07-03 NOTE — Telephone Encounter (Signed)
Primary cardiologist is Dr. Rockey Situ- last seen 09/10/16 EP is Dr. Caryl Comes- last seen 09/16/17.   Patient scheduled for coloscopy on 07/08/18. He is currently taking eliquis for PAF.   Will forward to Dr. Rockey Situ to address clearance as Dr. Caryl Comes is out of the office this week.  To pharmacy pre-op pharmacy pool for anticoagulation clearance.

## 2018-07-03 NOTE — Telephone Encounter (Signed)
Patient with diagnosis of ATRIAL FIBRILLATION on ELIQUIS 5MG  for anticoagulation.    Procedure: COLONOSCOPY Date of procedure: 07/08/2018  CHADS2-VASc score of  2 (CHF, HTN)  CrCl >60ML/MIN Platelet count 321  Per office protocol, patient can hold ELIQUIS for 1 day prior to procedure.     Derrick Murphy PharmD, BCPS, [place] [address] [place] [address] 07/03/2018 4:22 PM

## 2018-07-03 NOTE — Telephone Encounter (Signed)
I last saw the patient February 2018 Can we call patient to see if there has been any change in status over the past year Last seen by Dr. Caryl Comes early 2019 Should be fine to stop anticoagulation for 24 hours As to whether they would be cleared medically or from cardiac perspective I am unable to state as I have not seen him in close to 2 years Perhaps primary care could weigh in

## 2018-07-06 ENCOUNTER — Ambulatory Visit
Admission: RE | Admit: 2018-07-06 | Discharge: 2018-07-06 | Disposition: A | Discharge status: Home / Self Care | Payer: BLUE CROSS/BLUE SHIELD | Source: Ambulatory Visit | Attending: Gastroenterology | Admitting: Gastroenterology

## 2018-07-06 DIAGNOSIS — K7030 Alcoholic cirrhosis of liver without ascites: Secondary | ICD-10-CM | POA: Diagnosis present

## 2018-07-06 NOTE — Telephone Encounter (Signed)
He was last seen by me for ABD pain/back pain in Nov 2019. CBC and CMP were stable, medically his exam was stable in clinic. He can stop the eliquis 1 day before surgery and I believe he is OK to proceed with procedure especially due to recent issues with ABD pain.

## 2018-07-06 NOTE — Telephone Encounter (Signed)
Agree w TG

## 2018-07-06 NOTE — Telephone Encounter (Signed)
Copy of encounter faxed to Dr. Alice Reichert at [facility] via [place].

## 2018-07-08 NOTE — Telephone Encounter (Signed)
Addressed by [employer] (preop box does not currently address satellite offices) Will remove from [place] preop box Winta Barcelo PA-C

## 2018-07-18 ENCOUNTER — Ambulatory Visit
Admission: RE | Admit: 2018-07-18 | Discharge: 2018-07-18 | Disposition: A | Discharge status: Home / Self Care | Payer: BLUE CROSS/BLUE SHIELD | Source: Ambulatory Visit | Attending: Family Medicine | Admitting: Family Medicine

## 2018-07-18 DIAGNOSIS — M5136 Other intervertebral disc degeneration, lumbar region: Secondary | ICD-10-CM | POA: Diagnosis not present

## 2018-07-18 DIAGNOSIS — M5127 Other intervertebral disc displacement, lumbosacral region: Secondary | ICD-10-CM | POA: Insufficient documentation

## 2018-07-18 DIAGNOSIS — M545 Low back pain, unspecified: Secondary | ICD-10-CM

## 2018-07-18 DIAGNOSIS — M5137 Other intervertebral disc degeneration, lumbosacral region: Secondary | ICD-10-CM | POA: Diagnosis not present

## 2018-07-18 DIAGNOSIS — M87059 Idiopathic aseptic necrosis of unspecified femur: Secondary | ICD-10-CM

## 2018-07-18 MED ORDER — GADOBUTROL 1 MMOL/ML IV SOLN: 10.0000 mL | Freq: Once | INTRAVENOUS | Status: DC | PRN

## 2018-07-20 ENCOUNTER — Telehealth: Payer: Self-pay | Admitting: Family Medicine

## 2018-07-20 ENCOUNTER — Other Ambulatory Visit: Payer: Self-pay | Admitting: Family Medicine

## 2018-07-20 MED ORDER — LISINOPRIL 10 MG PO TABS: 10.0000 mg | ORAL_TABLET | Freq: Every day | ORAL | 0 refills | Status: DC

## 2018-07-20 MED ORDER — ALPRAZOLAM 0.5 MG PO TABS: 0.5000 mg | ORAL_TABLET | Freq: Once | ORAL | 0 refills | Status: AC

## 2018-07-20 NOTE — Telephone Encounter (Signed)
Copied from [place] [phone]. Topic: Quick Communication - Rx Refill/Question >> Jul 20, 2018  8:47 AM [name] wrote: Medication: lisinopril (PRINIVIL,ZESTRIL) 10 MG tablet ( Pt would like this be changed to a 90 day supply)   Has the patient contacted their pharmacy? No. ( No refills)   Preferred Pharmacy (with phone number or street name): [id number] [address], [place] [phone] (Phone) [phone] (Fax)    Agent: Please be advised that RX refills may take up to 3 business days. We ask that you follow-up with your pharmacy.

## 2018-07-20 NOTE — Telephone Encounter (Signed)
Copied from [place] [id number]. Topic: General - Other >> Jul 20, 2018  8:39 AM [name] wrote: Reason for CRM:  Pt was scheduled for an MRI (spinal) on 12.28.2019 and Pt could not complete the second portion due to not being able to stand it and needing to be sedated. Pt is scheduled to complete the MRI on 12.31.2019 @ 11am and needs something to be prescribed to help take the edge off to get through the rest of the procedure. / Pt has be seeing [name]/ please advise asap

## 2018-07-20 NOTE — Telephone Encounter (Signed)
Sent xanax to pharmacy 

## 2018-07-20 NOTE — Telephone Encounter (Signed)
Left message to call office

## 2018-07-20 NOTE — Telephone Encounter (Signed)
See request for 90 day supply is applicable.

## 2018-07-20 NOTE — Telephone Encounter (Signed)
Please contact the patient and see if he has somebody to drive him to and from the MRI.  He has to have somebody transport him to be able to receive medication for this indication.

## 2018-07-20 NOTE — Telephone Encounter (Signed)
Patient could not complete MRI today needs some kind of sedation per patient he has been rescheduled for tomorrow.

## 2018-07-20 NOTE — Telephone Encounter (Signed)
Patient does have someone to drive him to MRI. Please f/u with patient and let him know if the medication will be sent in time for his appt.

## 2018-07-21 ENCOUNTER — Ambulatory Visit: Payer: BLUE CROSS/BLUE SHIELD

## 2018-07-23 ENCOUNTER — Other Ambulatory Visit: Payer: Self-pay | Admitting: Family Medicine

## 2018-07-23 DIAGNOSIS — M5136 Other intervertebral disc degeneration, lumbar region: Secondary | ICD-10-CM

## 2018-07-24 ENCOUNTER — Telehealth: Payer: Self-pay | Admitting: Family Medicine

## 2018-07-24 NOTE — Telephone Encounter (Signed)
Duplicate. See result note.

## 2018-08-12 ENCOUNTER — Other Ambulatory Visit: Payer: Self-pay | Admitting: Family Medicine

## 2018-09-02 ENCOUNTER — Ambulatory Visit
Payer: BLUE CROSS/BLUE SHIELD | Attending: Student in an Organized Health Care Education/Training Program | Admitting: Student in an Organized Health Care Education/Training Program

## 2018-09-02 ENCOUNTER — Encounter: Payer: Self-pay | Admitting: Student in an Organized Health Care Education/Training Program

## 2018-09-02 ENCOUNTER — Other Ambulatory Visit: Payer: Self-pay

## 2018-09-02 VITALS — BP 120/75 | HR 81 | Temp 98.8°F | Resp 18 | Ht 68.0 in | Wt 253.2 lb

## 2018-09-02 DIAGNOSIS — M5416 Radiculopathy, lumbar region: Secondary | ICD-10-CM | POA: Insufficient documentation

## 2018-09-02 DIAGNOSIS — M5136 Other intervertebral disc degeneration, lumbar region: Secondary | ICD-10-CM | POA: Diagnosis not present

## 2018-09-02 DIAGNOSIS — I480 Paroxysmal atrial fibrillation: Secondary | ICD-10-CM | POA: Insufficient documentation

## 2018-09-02 DIAGNOSIS — M48061 Spinal stenosis, lumbar region without neurogenic claudication: Secondary | ICD-10-CM | POA: Diagnosis not present

## 2018-09-02 MED ORDER — GABAPENTIN 300 MG PO CAPS: ORAL_CAPSULE | ORAL | 1 refills | Status: DC

## 2018-09-02 MED ORDER — TIZANIDINE HCL 2 MG PO TABS: 4.0000 mg | ORAL_TABLET | Freq: Two times a day (BID) | ORAL | 0 refills | Status: DC | PRN

## 2018-09-02 NOTE — Progress Notes (Signed)
Patient's Name: Derrick Murphy  MRN: [id number]  Referring Provider: Meade Maw, MD  DOB: 07/02/1966  PCP: Leone Haven, MD  DOS: 09/02/2018  Note by: Gillis Santa, MD  Service setting: Ambulatory outpatient  Specialty: Interventional Pain Management  Location: [facility] (AMB) Pain Management Facility  Visit type: Initial Patient Evaluation  Patient type: New Patient   Primary Reason(s) for Visit: Encounter for initial evaluation of one or more chronic problems (new to examiner) potentially causing chronic pain, and posing a threat to normal musculoskeletal function. (Level of risk: High) CC: Back Pain (lower)  HPI  Derrick Murphy is a 53 y.o. year old, male patient, who comes today to see [place] for the first time for an initial evaluation of his chronic pain. He has Atrial fibrillation ([place]); Obesity (BMI 30-39.9); History of alcohol abuse; Former smoker; Chronic systolic CHF (congestive heart failure) ([place]); Benign essential HTN; OSA on CPAP; BPH (benign prostatic hyperplasia); Tachycardia; Skin lesion; Anxiety; Hepatic cirrhosis ([place]); Finger deformity, acquired, right; Multiple joint pain; Lumbar radiculopathy; Spinal stenosis of lumbar region without neurogenic claudication; Lumbar foraminal stenosis; and Lumbar degenerative disc disease on their problem list. Today he comes in for evaluation of his Back Pain (lower)  Pain Assessment: Location: Lower Back Radiating: "shoots up to mid back and sometimes down to left knee Onset: More than a month ago Duration: Chronic pain Quality: Constant, Dull, Shooting Severity: 3 /10 (subjective, self-reported pain score)  Note: Reported level is compatible with observation.                         When using our objective Pain Scale, levels between 6 and 10/10 are said to belong in an emergency room, as it progressively worsens from a 6/10, described as severely limiting, requiring emergency care not usually available at an outpatient pain management  facility. At a 6/10 level, communication becomes difficult and requires great effort. Assistance to reach the emergency department may be required. Facial flushing and profuse sweating along with potentially dangerous increases in heart rate and blood pressure will be evident. Effect on ADL: limits daily activities Timing: Constant Modifying factors: lying down BP: 120/75  HR: 81  Onset and Duration: Present longer than 3 months Cause of pain: Unknown Severity: NAS-11 at its worse: 8/10 Timing: During activity or exercise Aggravating Factors: Bending, Kneeling, Lifiting, Squatting and Stooping  Alleviating Factors: Lying down and Resting Associated Problems: Pain that does not allow patient to sleep Quality of Pain: Aching, Burning, Exhausting, Nagging, Pressure-like and Sharp Previous Examinations or Tests: CT scan, MRI scan and Neurosurgical evaluation Previous Treatments: The patient denies none noted  The patient comes into the clinics today for the first time for a chronic pain management evaluation.  Patient is a 53 year old male who presents with a chief complaint of axial low back pain.  No inciting or traumatic event.  Patient's pain also occasionally radiates to his left knee and he also has numbness and burning in his left buttock and posterior lateral thigh.  Patient has not tried physical therapy.  He has not tried any epidural steroid injection.  Patient denies any bowel or bladder dysfunction.  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  Patient has been seen and evaluated by neurosurgery.  They recommended conservative management and follow-up in 2 months.  Of note patient is  on apixaban for atrial fibrillation.  Of note, prior urine tox screens are positive for ethanol.  We will focus primarily on interventional pain management and non-opioid-based  analgesic management.  Recommend patient to continue with physical therapy which he states he is going to. Historical Monitoring: The patient  reports no history of drug use. List of all UDS Test(s): Lab Results  Component Value Date   MDMA NONE DETECTED 10/20/2015   COCAINSCRNUR NONE DETECTED 10/20/2015   PCPSCRNUR NONE DETECTED 10/20/2015   THCU NONE DETECTED 10/20/2015   ETH 295 (H) 10/04/2017   ETH 276 (H) 10/20/2015   List of other Serum/Urine Drug Screening Test(s):  Lab Results  Component Value Date   COCAINSCRNUR NONE DETECTED 10/20/2015   THCU NONE DETECTED 10/20/2015   ETH 295 (H) 10/04/2017   ETH 276 (H) 10/20/2015   Historical Background Evaluation: [place] PMP: Six (6) year initial data search conducted.               Meds   Current Outpatient Medications:  .  ELIQUIS 5 MG TABS tablet, TAKE 1 TABLET BY MOUTH TWICE A DAY, Disp: 180 tablet, Rfl: 1 .  lisinopril (PRINIVIL,ZESTRIL) 10 MG tablet, TAKE 1 TABLET BY MOUTH EVERY DAY, Disp: 30 tablet, Rfl: 1 .  magnesium oxide (MAG-OX) 400 MG tablet, Take 400 mg by mouth daily., Disp: , Rfl:  .  metoprolol succinate (TOPROL-XL) 25 MG 24 hr tablet, Take 1 tablet (25 mg total) by mouth 2 (two) times daily., Disp: 180 tablet, Rfl: 3 .  Misc Natural Products (GLUCOSAMINE CHOND COMPLEX/MSM PO), Take by mouth., Disp: , Rfl:  .  tamsulosin (FLOMAX) 0.4 MG CAPS capsule, TAKE 1 CAPSULE BY MOUTH EVERYDAY AT BEDTIME, Disp: 90 capsule, Rfl: 1 .  cyclobenzaprine (FLEXERIL) 5 MG tablet, Take 1 tablet (5 mg total) by mouth 3 (three) times daily as needed for muscle spasms. (Patient not taking: Reported on 09/02/2018), Disp: 30 tablet, Rfl: 1 .  gabapentin (NEURONTIN) 300 MG capsule, 300 mg nightly for 2 weeks then increase to 300 mg twice daily if no side effects., Disp: 60 capsule, Rfl: 1 .  tiZANidine (ZANAFLEX) 2 MG tablet, Take 2 tablets (4 mg total) by mouth 2 (two) times daily as needed for up to 30 days for muscle spasms., Disp: 120 tablet,  Rfl: 0  Imaging Review  Lumbosacral Imaging: Lumbar MR wo contrast:  Results for orders placed during the hospital encounter of 07/18/18  MR Lumbar Spine Wo Contrast   Narrative CLINICAL DATA:  Bilateral L5 pars defects. Acute right-sided low back pain without sciatica  EXAM: MRI LUMBAR SPINE WITHOUT CONTRAST  TECHNIQUE: Multiplanar, multisequence MR imaging of the lumbar spine was performed. No intravenous contrast was administered.  COMPARISON:  06/16/2018 abdominal CT  FINDINGS: Segmentation: Rudimentary disc space at S1-2 when numbered from the lowest ribs.  Alignment:  5 mm of anterolisthesis at L5-S1  Vertebrae: Chronic bilateral L5 pars defects. No marrow edema or acute fracture  Conus medullaris and cauda equina: Conus extends to the L1 level. Conus and cauda equina appear normal.  Paraspinal and other soft tissues: Negative  Disc levels:  T12- L1: Unremarkable.  L1-L2: Unremarkable.  L2-L3: Minor disc bulging.  Mild epidural fat expansion  L3-L4: Minor disc bulging with epidural fat expansion.  L4-L5: Mild facet spurring and interspinous ligament thickening.  L5-S1:Mild disc narrowing and desiccation with circumferential bulge. Epidural fat expansion. Bilateral foraminal stenosis that could affect either L5 nerve root. No degenerative spinal impingement  Intermittent motion  artifact, best obtainable in this claustrophobic patient  IMPRESSION: L5 chronic bilateral pars defects with L5-S1 anterolisthesis and accelerated disc degeneration. Bilateral foraminal narrowing at this level that could affect either L5 nerve root.   Electronically Signed   By: Monte Fantasia M.D.   On: 07/19/2018 08:53   Hand-R DG Complete:  Results for orders placed in visit on 04/13/18  DG Hand Complete Right   Narrative CLINICAL DATA:  Right fifth digit injury.  EXAM: RIGHT HAND - COMPLETE 3+ VIEW  COMPARISON:  No prior.  FINDINGS: Subluxation of the right  proximal interphalangeal joint with prominent flexion deformity noted. No evidence of fracture. No radiopaque foreign body.  IMPRESSION: Subluxation right proximal interphalangeal joint with prominent flexion deformity.   Electronically Signed   By: Marcello Moores  Register   On: 04/14/2018 07:00      Complexity Note: Imaging results reviewed. Results shared with Mr. Sisneros, using Layman's terms.                         ROS  Cardiovascular: Heart trouble, Abnormal heart rhythm, Weak heart (CHF) and Blood thinners:  Anticoagulant Pulmonary or Respiratory: Temporary stoppage of breathing during sleep Neurological: No reported neurological signs or symptoms such as seizures, abnormal skin sensations, urinary and/or fecal incontinence, being born with an abnormal open spine and/or a tethered spinal cord Review of Past Neurological Studies:  Results for orders placed or performed during the hospital encounter of 10/20/15  CT Head Wo Contrast   Narrative   CLINICAL DATA:  Syncope after fall  EXAM: CT HEAD WITHOUT CONTRAST  TECHNIQUE: Contiguous axial images were obtained from the base of the skull through the vertex without intravenous contrast.  COMPARISON:  None.  FINDINGS: The ventricles are normal in size and configuration. There is no intracranial mass, hemorrhage, extra-axial fluid collection, or midline shift. Gray-white compartments appear normal. No acute infarct evident. The bony calvarium appears intact. The visualized mastoid air cells are clear. There is opacification of a posterolateral right ethmoid air cell. Visualized orbits appear symmetric.  IMPRESSION: Opacification of a posterolateral right ethmoid air cell. No intracranial mass, hemorrhage, or extra-axial fluid collection. Gray-white compartments appear normal.   Electronically Signed   By: Lowella Grip III M.D.   On: 10/20/2015 09:34    Psychological-Psychiatric: No reported psychological or  psychiatric signs or symptoms such as difficulty sleeping, anxiety, depression, delusions or hallucinations (schizophrenial), mood swings (bipolar disorders) or suicidal ideations or attempts Gastrointestinal: No reported gastrointestinal signs or symptoms such as vomiting or evacuating blood, reflux, heartburn, alternating episodes of diarrhea and constipation, inflamed or scarred liver, or pancreas or irrregular and/or infrequent bowel movements Genitourinary: No reported renal or genitourinary signs or symptoms such as difficulty voiding or producing urine, peeing blood, non-functioning kidney, kidney stones, difficulty emptying the bladder, difficulty controlling the flow of urine, or chronic kidney disease Hematological: No reported hematological signs or symptoms such as prolonged bleeding, low or poor functioning platelets, bruising or bleeding easily, hereditary bleeding problems, low energy levels due to low hemoglobin or being anemic Endocrine: No reported endocrine signs or symptoms such as high or low blood sugar, rapid heart rate due to high thyroid levels, obesity or weight gain due to slow thyroid or thyroid disease Rheumatologic: No reported rheumatological signs and symptoms such as fatigue, joint pain, tenderness, swelling, redness, heat, stiffness, decreased range of motion, with or without associated rash Musculoskeletal: Negative for myasthenia gravis, muscular dystrophy, multiple sclerosis or malignant hyperthermia  Work History: Working full time  Allergies  Mr. Bewley has No Known Allergies.  Laboratory Chemistry  Inflammation Markers (CRP: Acute Phase) (ESR: Chronic Phase) No results found for: CRP, ESRSEDRATE, LATICACIDVEN                       Rheumatology Markers Lab Results  Component Value Date   RF <14 04/13/2018   ANA NEGATIVE 04/13/2018                        Renal Function Markers Lab Results  Component Value Date   BUN 22 06/02/2018   CREATININE 1.18  06/02/2018   BCR 15 02/07/2017   GFRAA >60 10/04/2017   GFRNONAA >60 10/04/2017                             Hepatic Function Markers Lab Results  Component Value Date   AST 18 06/02/2018   ALT 20 06/02/2018   ALBUMIN 4.5 06/02/2018   ALKPHOS 70 06/02/2018                        Electrolytes Lab Results  Component Value Date   NA 139 06/02/2018   K 4.6 06/02/2018   CL 102 06/02/2018   CALCIUM 9.6 06/02/2018                        Neuropathy Markers Lab Results  Component Value Date   HGBA1C 5.4 02/07/2017                        CNS Tests No results found for: COLORCSF, APPEARCSF, RBCCOUNTCSF, WBCCSF, POLYSCSF, LYMPHSCSF, EOSCSF, PROTEINCSF, GLUCCSF, JCVIRUS, CSFOLI, IGGCSF                      Bone Pathology Markers No results found for: VD25OH, [id number], [id number], [id number], 25OHVITD1, 25OHVITD2, 25OHVITD3, TESTOFREE, TESTOSTERONE                       Coagulation Parameters Lab Results  Component Value Date   INR 1.23 10/20/2015   LABPROT 15.7 (H) 10/20/2015   PLT 321.0 06/02/2018                        Cardiovascular Markers Lab Results  Component Value Date   TROPONINI <0.03 10/04/2017   HGB 14.8 06/02/2018   HCT 43.9 06/02/2018                         CA Markers No results found for: CEA, CA125, LABCA2                      Endocrine Markers Lab Results  Component Value Date   TSH 1.590 02/07/2017                        Note: Lab results reviewed.  [place]  Drug: Mr. Brandow  reports no history of drug use. Alcohol:  reports current alcohol use of about 2.0 - 3.0 standard drinks of alcohol per week. Tobacco:  reports that he quit smoking about 2 years ago. He has never used smokeless tobacco. Medical:  has a past medical history of Alcohol abuse, Atrial fibrillation ([place]), CHF (congestive  heart failure) ([place]), Depression, Hypertension, and Tobacco abuse. Family: family history includes Heart disease in his father, mother, and paternal  uncle.  Past Surgical History:  Procedure Laterality Date  . ABLATION    . CARDIOVERSION     Active Ambulatory Problems    Diagnosis Date Noted  . Atrial fibrillation ([place]) 08/12/2016  . Obesity (BMI 30-39.9) 08/12/2016  . History of alcohol abuse 08/12/2016  . Former smoker 08/12/2016  . Chronic systolic CHF (congestive heart failure) ([place]) 08/12/2016  . Benign essential HTN 08/12/2016  . OSA on CPAP 08/12/2016  . BPH (benign prostatic hyperplasia) 08/12/2016  . Tachycardia 02/18/2017  . Skin lesion 10/17/2017  . Anxiety 10/17/2017  . Hepatic cirrhosis ([place]) 04/14/2018  . Finger deformity, acquired, right 04/14/2018  . Multiple joint pain 04/14/2018  . Lumbar radiculopathy 09/02/2018  . Spinal stenosis of lumbar region without neurogenic claudication 09/02/2018  . Lumbar foraminal stenosis 09/02/2018  . Lumbar degenerative disc disease 09/02/2018   Resolved Ambulatory Problems    Diagnosis Date Noted  . No Resolved Ambulatory Problems   Past Medical History:  Diagnosis Date  . Alcohol abuse   . CHF (congestive heart failure) ([place])   . Depression   . Hypertension   . Tobacco abuse    Constitutional Exam  General appearance: Well nourished, well developed, and well hydrated. In no apparent acute distress Vitals:   09/02/18 1352  BP: 120/75  Pulse: 81  Resp: 18  Temp: 98.8 F (37.1 C)  TempSrc: Oral  SpO2: 96%  Weight: 253 lb 3.2 oz (114.9 kg)  Height: 5' 8"  (1.727 m)   BMI Assessment: Estimated body mass index is 38.5 kg/m as calculated from the following:   Height as of this encounter: 5' 8"  (1.727 m).   Weight as of this encounter: 253 lb 3.2 oz (114.9 kg).  BMI interpretation table: BMI level Category Range association with higher incidence of chronic pain  <18 kg/m2 Underweight   18.5-24.9 kg/m2 Ideal body weight   25-29.9 kg/m2 Overweight Increased incidence by 20%  30-34.9 kg/m2 Obese (Class I) Increased incidence by 68%  35-39.9 kg/m2 Severe  obesity (Class II) Increased incidence by 136%  >40 kg/m2 Extreme obesity (Class III) Increased incidence by 254%   Patient's current BMI Ideal Body weight  Body mass index is 38.5 kg/m. Ideal body weight: 68.4 kg (150 lb 12.7 oz) Adjusted ideal body weight: 87 kg (191 lb 12.1 oz)   BMI Readings from Last 4 Encounters:  09/02/18 38.50 kg/m  06/02/18 37.68 kg/m  04/13/18 37.31 kg/m  10/17/17 36.23 kg/m   Wt Readings from Last 4 Encounters:  09/02/18 253 lb 3.2 oz (114.9 kg)  06/02/18 247 lb 12.8 oz (112.4 kg)  04/13/18 245 lb 6.4 oz (111.3 kg)  10/17/17 238 lb 4 oz (108.1 kg)  Psych/Mental status: Alert, oriented x 3 (person, place, & time)       Eyes: PERLA Respiratory: No evidence of acute respiratory distress  Cervical Spine Area Exam  Skin & Axial Inspection: No masses, redness, edema, swelling, or associated skin lesions Alignment: Symmetrical Functional ROM: Unrestricted ROM      Stability: No instability detected Muscle Tone/Strength: Functionally intact. No obvious neuro-muscular anomalies detected. Sensory (Neurological): Unimpaired Palpation: No palpable anomalies              Upper Extremity (UE) Exam    Side: Right upper extremity  Side: Left upper extremity  Skin & Extremity Inspection: Skin color, temperature, and hair growth are WNL. No  peripheral edema or cyanosis. No masses, redness, swelling, asymmetry, or associated skin lesions. No contractures.  Skin & Extremity Inspection: Skin color, temperature, and hair growth are WNL. No peripheral edema or cyanosis. No masses, redness, swelling, asymmetry, or associated skin lesions. No contractures.  Functional ROM: Unrestricted ROM          Functional ROM: Unrestricted ROM          Muscle Tone/Strength: Functionally intact. No obvious neuro-muscular anomalies detected.  Muscle Tone/Strength: Functionally intact. No obvious neuro-muscular anomalies detected.  Sensory (Neurological): Unimpaired          Sensory  (Neurological): Unimpaired          Palpation: No palpable anomalies              Palpation: No palpable anomalies              Provocative Test(s):  Phalen's test: deferred Tinel's test: deferred Apley's scratch test (touch opposite shoulder):  Action 1 (Across chest): deferred Action 2 (Overhead): deferred Action 3 (LB reach): deferred   Provocative Test(s):  Phalen's test: deferred Tinel's test: deferred Apley's scratch test (touch opposite shoulder):  Action 1 (Across chest): deferred Action 2 (Overhead): deferred Action 3 (LB reach): deferred    Thoracic Spine Area Exam  Skin & Axial Inspection: No masses, redness, or swelling Alignment: Symmetrical Functional ROM: Unrestricted ROM Stability: No instability detected Muscle Tone/Strength: Functionally intact. No obvious neuro-muscular anomalies detected. Sensory (Neurological): Unimpaired Muscle strength & Tone: No palpable anomalies  Lumbar Spine Area Exam  Skin & Axial Inspection: No masses, redness, or swelling Alignment: Symmetrical Functional ROM: Pain restricted ROM affecting both sides Stability: No instability detected Muscle Tone/Strength: Functionally intact. No obvious neuro-muscular anomalies detected. Sensory (Neurological): Musculoskeletal pain pattern and possibly dermatomal left L4-L5, left L5-S1 Palpation: No palpable anomalies       Provocative Tests: Hyperextension/rotation test: (+) bilaterally for facet joint pain. Lumbar quadrant test (Kemp's test): (+) on the left for foraminal stenosis Lateral bending test: deferred today       Patrick's Maneuver: (+) for bilateral S-I arthralgia             FABER* test: deferred today                   S-I anterior distraction/compression test: deferred today         S-I lateral compression test: deferred today         S-I Thigh-thrust test: deferred today         S-I Gaenslen's test: deferred today         *(Flexion, ABduction and External Rotation)  Gait &  Posture Assessment  Ambulation: Unassisted Gait: Relatively normal for age and body habitus Posture: WNL   Lower Extremity Exam    Side: Right lower extremity  Side: Left lower extremity  Stability: No instability observed          Stability: No instability observed          Skin & Extremity Inspection: Skin color, temperature, and hair growth are WNL. No peripheral edema or cyanosis. No masses, redness, swelling, asymmetry, or associated skin lesions. No contractures.  Skin & Extremity Inspection: Skin color, temperature, and hair growth are WNL. No peripheral edema or cyanosis. No masses, redness, swelling, asymmetry, or associated skin lesions. No contractures.  Functional ROM: Unrestricted ROM                  Functional ROM: Decreased ROM for hip and  knee joints          Muscle Tone/Strength: Functionally intact. No obvious neuro-muscular anomalies detected.  Muscle Tone/Strength: Functionally intact. No obvious neuro-muscular anomalies detected.  Sensory (Neurological): Unimpaired        Sensory (Neurological): Musculoskeletal pain pattern        DTR: Patellar: deferred today Achilles: deferred today Plantar: deferred today  DTR: Patellar: deferred today Achilles: deferred today Plantar: deferred today  Palpation: No palpable anomalies  Palpation: No palpable anomalies   Assessment  Primary Diagnosis & Pertinent Problem List: The primary encounter diagnosis was Lumbar radiculopathy. Diagnoses of Spinal stenosis of lumbar region without neurogenic claudication, Lumbar foraminal stenosis, Lumbar degenerative disc disease, and Paroxysmal atrial fibrillation ([place]) were also pertinent to this visit.  Visit Diagnosis (New problems to examiner): 1. Lumbar radiculopathy   2. Spinal stenosis of lumbar region without neurogenic claudication   3. Lumbar foraminal stenosis   4. Lumbar degenerative disc disease   5. Paroxysmal atrial fibrillation ([facility])    Symptoms of axial low back pain  could be secondary to lumbar radiculopathy and or lumbar facet pathology.  Radiation pattern is consistent with both.  Patient does have pain with facet loading and lateral rotation.  Describes pain usually in a bandlike pattern along his lower lumbar spine.  Patient also describes occasional radiation to his left buttock and left lateral thigh.  Patient's lumbar MRI shows bilateral foraminal stenosis that could be affecting either L5 nerve root and epidural lipomatosis at this level.  Patient also has a bilateral pars defects at L5.  Start with left L5-S1 interlaminar steroid injection.  If not beneficial can consider pars injection at L5.  Recommend patient continue with physical therapy which he states that he will start.  Patient is on Eliquis for atrial fibrillation.  Will need cardiac clearance to stop the medication for 3 days prior to lumbar epidural steroid injection.  Risks and benefits of lumbar ESI were discussed especially in context of stopping Eliquis.  Patient would like to proceed.  Will contact patient's PCP to obtain cardiac clearance.  Also recommend initiation of neuropathic such as gabapentin.  Plan: -Gabapentin 200 mg nightly for 2 weeks and 300 mg twice daily -Tizanidine 4 mg twice daily as needed for muscle spasms and lower lumbar cramps -Agree and recommend physical therapy -Left L5-S1 epidural steroid injection, cardiac clearance to stop Eliquis 3 days prior to scheduled procedure.  We will focus on non-opioid analgesic options.  Ordered Lab-work, Procedure(s), Referral(s), & Consult(s): Orders Placed This Encounter  Procedures  . Lumbar Epidural Injection   Pharmacotherapy (current): Medications ordered:  Meds ordered this encounter  Medications  . gabapentin (NEURONTIN) 300 MG capsule    Sig: 300 mg nightly for 2 weeks then increase to 300 mg twice daily if no side effects.    Dispense:  60 capsule    Refill:  1  . tiZANidine (ZANAFLEX) 2 MG tablet    Sig:  Take 2 tablets (4 mg total) by mouth 2 (two) times daily as needed for up to 30 days for muscle spasms.    Dispense:  120 tablet    Refill:  0    Do not place medication on "Automatic Refill". Fill one day early if pharmacy is closed on scheduled refill date.   Medications administered during this visit: Marylou Mccoy had no medications administered during this visit.   Pharmacological management options:  Opioid Analgesics: We will focus on non-opioid analgesics and interventional options.  Membrane stabilizer: Trial  of gabapentin today.  Consider Lyrica, Cymbalta, TCA in the future  Muscle relaxant: Has tried Flexeril in the past.  Trial of tizanidine today.  Can consider Robaxin.  NSAID: To be determined at a later time  Other analgesic(s): To be determined at a later time   Interventional management options: Mr. Mccravy was informed that there is no guarantee that he would be a candidate for interventional therapies. The decision will be based on the results of diagnostic studies, as well as Mr. Majkowski risk profile.  Procedure(s) under consideration:  Interlaminar left L5-S1 epidural steroid injection Left L5 transforaminal epidural steroid injection Left L5 pars injection Bilateral L3, L4, L5 facet medial branch nerve block   Provider-requested follow-up: Return for Procedure, Blood Thinner Protocol.  Future Appointments  Date Time Provider [place]  09/14/2018  8:45 AM Gillis Santa, MD [facility] None  10/08/2018  9:30 AM Deboraha Sprang, MD [facility]    Primary Care Physician: Leone Haven, MD Location: [facility] Note by: Gillis Santa, M.D, Date: 09/02/2018; Time: 8:18 AM  Patient Instructions   Patient needs cardiac clearance to stop Eliquis 3 days prior to scheduled procedure  Prescription for gabapentin and tizanidine as below.  Plan for L4-L5 epidural steroid injection without sedation.  GENERAL RISKS AND  COMPLICATIONS  What are the risk, side effects and possible complications? Generally speaking, most procedures are safe.  However, with any procedure there are risks, side effects, and the possibility of complications.  The risks and complications are dependent upon the sites that are lesioned, or the type of nerve block to be performed.  The closer the procedure is to the spine, the more serious the risks are.  Great care is taken when placing the radio frequency needles, block needles or lesioning probes, but sometimes complications can occur. 1. Infection: Any time there is an injection through the skin, there is a risk of infection.  This is why sterile conditions are used for these blocks.  There are four possible types of infection. 1. Localized skin infection. 2. Central Nervous System Infection-This can be in the form of Meningitis, which can be deadly. 3. Epidural Infections-This can be in the form of an epidural abscess, which can cause pressure inside of the spine, causing compression of the spinal cord with subsequent paralysis. This would require an emergency surgery to decompress, and there are no guarantees that the patient would recover from the paralysis. 4. Discitis-This is an infection of the intervertebral discs.  It occurs in about 1% of discography procedures.  It is difficult to treat and it may lead to surgery.        2. Pain: the needles have to go through skin and soft tissues, will cause soreness.       3. Damage to internal structures:  The nerves to be lesioned may be near blood vessels or    other nerves which can be potentially damaged.       4. Bleeding: Bleeding is more common if the patient is taking blood thinners such as  aspirin, Coumadin, Ticiid, Plavix, etc., or if he/she have some genetic predisposition  such as hemophilia. Bleeding into the spinal canal can cause compression of the spinal  cord with subsequent paralysis.  This would require an emergency surgery  to  decompress and there are no guarantees that the patient would recover from the  paralysis.       5. Pneumothorax:  Puncturing of a lung is a  possibility, every time a needle is introduced in  the area of the chest or upper back.  Pneumothorax refers to free air around the  collapsed lung(s), inside of the thoracic cavity (chest cavity).  Another two possible  complications related to a similar event would include: Hemothorax and Chylothorax.   These are variations of the Pneumothorax, where instead of air around the collapsed  lung(s), you may have blood or chyle, respectively.       6. Spinal headaches: They may occur with any procedures in the area of the spine.       7. Persistent CSF (Cerebro-Spinal Fluid) leakage: This is a rare problem, but may occur  with prolonged intrathecal or epidural catheters either due to the formation of a fistulous  track or a dural tear.       8. Nerve damage: By working so close to the spinal cord, there is always a possibility of  nerve damage, which could be as serious as a permanent spinal cord injury with  paralysis.       9. Death:  Although rare, severe deadly allergic reactions known as "Anaphylactic  reaction" can occur to any of the medications used.      10. Worsening of the symptoms:  We can always make thing worse.  What are the chances of something like this happening? Chances of any of this occuring are extremely low.  By statistics, you have more of a chance of getting killed in a motor vehicle accident: while driving to the hospital than any of the above occurring .  Nevertheless, you should be aware that they are possibilities.  In general, it is similar to taking a shower.  Everybody knows that you can slip, hit your head and get killed.  Does that mean that you should not shower again?  Nevertheless always keep in mind that statistics do not mean anything if you happen to be on the wrong side of them.  Even if a procedure has a 1 (one) in a 1,000,000  (million) chance of going wrong, it you happen to be that one..Also, keep in mind that by statistics, you have more of a chance of having something go wrong when taking medications.  Who should not have this procedure? If you are on a blood thinning medication (e.g. Coumadin, Plavix, see list of "Blood Thinners"), or if you have an active infection going on, you should not have the procedure.  If you are taking any blood thinners, please inform your physician.  How should I prepare for this procedure?  Do not eat or drink anything at least six hours prior to the procedure.  Bring a driver with you .  It cannot be a taxi.  Come accompanied by an adult that can drive you back, and that is strong enough to help you if your legs get weak or numb from the local anesthetic.  Take all of your medicines the morning of the procedure with just enough water to swallow them.  If you have diabetes, make sure that you are scheduled to have your procedure done first thing in the morning, whenever possible.  If you have diabetes, take only half of your insulin dose and notify our nurse that you have done so as soon as you arrive at the clinic.  If you are diabetic, but only take blood sugar pills (oral hypoglycemic), then do not take them on the morning of your procedure.  You may take them after you have had  the procedure.  Do not take aspirin or any aspirin-containing medications, at least eleven (11) days prior to the procedure.  They may prolong bleeding.  Wear loose fitting clothing that may be easy to take off and that you would not mind if it got stained with Betadine or blood.  Do not wear any jewelry or perfume  Remove any nail coloring.  It will interfere with some of our monitoring equipment.  NOTE: Remember that this is not meant to be interpreted as a complete list of all possible complications.  Unforeseen problems may occur.  BLOOD THINNERS The following drugs contain aspirin or other  products, which can cause increased bleeding during surgery and should not be taken for 2 weeks prior to and 1 week after surgery.  If you should need take something for relief of minor pain, you may take acetaminophen which is found in Tylenol,m Datril, Anacin-3 and Panadol. It is not blood thinner. The products listed below are.  Do not take any of the products listed below in addition to any listed on your instruction sheet.  A.P.C or A.P.C with Codeine Codeine Phosphate Capsules #3 Ibuprofen Ridaura  ABC compound Congesprin Imuran rimadil  Advil Cope Indocin Robaxisal  Alka-Seltzer Effervescent Pain Reliever and Antacid Coricidin or Coricidin-D  Indomethacin Rufen  Alka-Seltzer plus Cold Medicine Cosprin Ketoprofen S-A-C Tablets  Anacin Analgesic Tablets or Capsules Coumadin Korlgesic Salflex  Anacin Extra Strength Analgesic tablets or capsules CP-2 Tablets Lanoril Salicylate  Anaprox Cuprimine Capsules Levenox Salocol  Anexsia-D Dalteparin Magan Salsalate  Anodynos Darvon compound Magnesium Salicylate Sine-off  Ansaid Dasin Capsules Magsal Sodium Salicylate  Anturane Depen Capsules Marnal Soma  APF Arthritis pain formula Dewitt's Pills Measurin Stanback  Argesic Dia-Gesic Meclofenamic Sulfinpyrazone  Arthritis Bayer Timed Release Aspirin Diclofenac Meclomen Sulindac  Arthritis pain formula Anacin Dicumarol Medipren Supac  Analgesic (Safety coated) Arthralgen Diffunasal Mefanamic Suprofen  Arthritis Strength Bufferin Dihydrocodeine Mepro Compound Suprol  Arthropan liquid Dopirydamole Methcarbomol with Aspirin Synalgos  ASA tablets/Enseals Disalcid Micrainin Tagament  Ascriptin Doan's Midol Talwin  Ascriptin A/D Dolene Mobidin Tanderil  Ascriptin Extra Strength Dolobid Moblgesic Ticlid  Ascriptin with Codeine Doloprin or Doloprin with Codeine Momentum Tolectin  Asperbuf Duoprin Mono-gesic Trendar  Aspergum Duradyne Motrin or Motrin IB Triminicin  Aspirin plain, buffered or enteric  coated Durasal Myochrisine Trigesic  Aspirin Suppositories Easprin Nalfon Trillsate  Aspirin with Codeine Ecotrin Regular or Extra Strength Naprosyn Uracel  Atromid-S Efficin Naproxen Ursinus  Auranofin Capsules Elmiron Neocylate Vanquish  Axotal Emagrin Norgesic Verin  Azathioprine Empirin or Empirin with Codeine Normiflo Vitamin E  Azolid Emprazil Nuprin Voltaren  Bayer Aspirin plain, buffered or children's or timed BC Tablets or powders Encaprin Orgaran Warfarin Sodium  Buff-a-Comp Enoxaparin Orudis Zorpin  Buff-a-Comp with Codeine Equegesic Os-Cal-Gesic   Buffaprin Excedrin plain, buffered or Extra Strength Oxalid   Bufferin Arthritis Strength Feldene Oxphenbutazone   Bufferin plain or Extra Strength Feldene Capsules Oxycodone with Aspirin   Bufferin with Codeine Fenoprofen Fenoprofen Pabalate or Pabalate-SF   Buffets II Flogesic Panagesic   Buffinol plain or Extra Strength Florinal or Florinal with Codeine Panwarfarin   Buf-Tabs Flurbiprofen Penicillamine   Butalbital Compound Four-way cold tablets Penicillin   Butazolidin Fragmin Pepto-Bismol   Carbenicillin Geminisyn Percodan   Carna Arthritis Reliever Geopen Persantine   Carprofen Gold's salt Persistin   Chloramphenicol Goody's Phenylbutazone   Chloromycetin Haltrain Piroxlcam   Clmetidine heparin Plaquenil   Cllnoril Hyco-pap Ponstel   Clofibrate Hydroxy chloroquine Propoxyphen  Before stopping any of these medications, be sure to consult the physician who ordered them.  Some, such as Coumadin (Warfarin) are ordered to prevent or treat serious conditions such as "deep thrombosis", "pumonary embolisms", and other heart problems.  The amount of time that you may need off of the medication may also vary with the medication and the reason for which you were taking it.  If you are taking any of these medications, please make sure you notify your pain physician before you undergo any procedures.   Epidural Steroid  Injection Patient Information  Description: The epidural space surrounds the nerves as they exit the spinal cord.  In some patients, the nerves can be compressed and inflamed by a bulging disc or a tight spinal canal (spinal stenosis).  By injecting steroids into the epidural space, we can bring irritated nerves into direct contact with a potentially helpful medication.  These steroids act directly on the irritated nerves and can reduce swelling and inflammation which often leads to decreased pain.  Epidural steroids may be injected anywhere along the spine and from the neck to the low back depending upon the location of your pain.   After numbing the skin with local anesthetic (like Novocaine), a small needle is passed into the epidural space slowly.  You may experience a sensation of pressure while this is being done.  The entire block usually last less than 10 minutes.  Conditions which may be treated by epidural steroids:   Low back and leg pain  Neck and arm pain  Spinal stenosis  Post-laminectomy syndrome  Herpes zoster (shingles) pain  Pain from compression fractures  Preparation for the injection:  1. Do not eat any solid food or dairy products within 8 hours of your appointment.  2. You may drink clear liquids up to 3 hours before appointment.  Clear liquids include water, black coffee, juice or soda.  No milk or cream please. 3. You may take your regular medication, including pain medications, with a sip of water before your appointment  Diabetics should hold regular insulin (if taken separately) and take 1/2 normal NPH dos the morning of the procedure.  Carry some sugar containing items with you to your appointment. 4. A driver must accompany you and be prepared to drive you home after your procedure.  5. Bring all your current medications with your. 6. An IV may be inserted and sedation may be given at the discretion of the physician.   7. A blood pressure cuff, EKG and other  monitors will often be applied during the procedure.  Some patients may need to have extra oxygen administered for a short period. 8. You will be asked to provide medical information, including your allergies, prior to the procedure.  We must know immediately if you are taking blood thinners (like Coumadin/Warfarin)  Or if you are allergic to IV iodine contrast (dye). We must know if you could possible be pregnant.  Possible side-effects:  Bleeding from needle site  Infection (rare, may require surgery)  Nerve injury (rare)  Numbness & tingling (temporary)  Difficulty urinating (rare, temporary)  Spinal headache ( a headache worse with upright posture)  Light -headedness (temporary)  Pain at injection site (several days)  Decreased blood pressure (temporary)  Weakness in arm/leg (temporary)  Pressure sensation in back/neck (temporary)  Call if you experience:  Fever/chills associated with headache or increased back/neck pain.  Headache worsened by an upright position.  New onset weakness or numbness of an extremity  below the injection site  Hives or difficulty breathing (go to the emergency room)  Inflammation or drainage at the infection site  Severe back/neck pain  Any new symptoms which are concerning to you  Please note:  Although the local anesthetic injected can often make your back or neck feel good for several hours after the injection, the pain will likely return.  It takes 3-7 days for steroids to work in the epidural space.  You may not notice any pain relief for at least that one week.  If effective, we will often do a series of three injections spaced 3-6 weeks apart to maximally decrease your pain.  After the initial series, we generally will wait several months before considering a repeat injection of the same type.  If you have any questions, please call [phone] [facility]

## 2018-09-02 NOTE — Patient Instructions (Addendum)
Patient needs cardiac clearance to stop Eliquis 3 days prior to scheduled procedure  Prescription for gabapentin and tizanidine as below.  Plan for L4-L5 epidural steroid injection without sedation.  GENERAL RISKS AND COMPLICATIONS  What are the risk, side effects and possible complications? Generally speaking, most procedures are safe.  However, with any procedure there are risks, side effects, and the possibility of complications.  The risks and complications are dependent upon the sites that are lesioned, or the type of nerve block to be performed.  The closer the procedure is to the spine, the more serious the risks are.  Great care is taken when placing the radio frequency needles, block needles or lesioning probes, but sometimes complications can occur. 1. Infection: Any time there is an injection through the skin, there is a risk of infection.  This is why sterile conditions are used for these blocks.  There are four possible types of infection. 1. Localized skin infection. 2. Central Nervous System Infection-This can be in the form of Meningitis, which can be deadly. 3. Epidural Infections-This can be in the form of an epidural abscess, which can cause pressure inside of the spine, causing compression of the spinal cord with subsequent paralysis. This would require an emergency surgery to decompress, and there are no guarantees that the patient would recover from the paralysis. 4. Discitis-This is an infection of the intervertebral discs.  It occurs in about 1% of discography procedures.  It is difficult to treat and it may lead to surgery.        2. Pain: the needles have to go through skin and soft tissues, will cause soreness.       3. Damage to internal structures:  The nerves to be lesioned may be near blood vessels or    other nerves which can be potentially damaged.       4. Bleeding: Bleeding is more common if the patient is taking blood thinners such as  aspirin, Coumadin, Ticiid,  Plavix, etc., or if he/she have some genetic predisposition  such as hemophilia. Bleeding into the spinal canal can cause compression of the spinal  cord with subsequent paralysis.  This would require an emergency surgery to  decompress and there are no guarantees that the patient would recover from the  paralysis.       5. Pneumothorax:  Puncturing of a lung is a possibility, every time a needle is introduced in  the area of the chest or upper back.  Pneumothorax refers to free air around the  collapsed lung(s), inside of the thoracic cavity (chest cavity).  Another two possible  complications related to a similar event would include: Hemothorax and Chylothorax.   These are variations of the Pneumothorax, where instead of air around the collapsed  lung(s), you may have blood or chyle, respectively.       6. Spinal headaches: They may occur with any procedures in the area of the spine.       7. Persistent CSF (Cerebro-Spinal Fluid) leakage: This is a rare problem, but may occur  with prolonged intrathecal or epidural catheters either due to the formation of a fistulous  track or a dural tear.       8. Nerve damage: By working so close to the spinal cord, there is always a possibility of  nerve damage, which could be as serious as a permanent spinal cord injury with  paralysis.       9. Death:  Although rare, severe deadly allergic  reactions known as "Anaphylactic  reaction" can occur to any of the medications used.      10. Worsening of the symptoms:  We can always make thing worse.  What are the chances of something like this happening? Chances of any of this occuring are extremely low.  By statistics, you have more of a chance of getting killed in a motor vehicle accident: while driving to the hospital than any of the above occurring .  Nevertheless, you should be aware that they are possibilities.  In general, it is similar to taking a shower.  Everybody knows that you can slip, hit your head and get  killed.  Does that mean that you should not shower again?  Nevertheless always keep in mind that statistics do not mean anything if you happen to be on the wrong side of them.  Even if a procedure has a 1 (one) in a 1,000,000 (million) chance of going wrong, it you happen to be that one..Also, keep in mind that by statistics, you have more of a chance of having something go wrong when taking medications.  Who should not have this procedure? If you are on a blood thinning medication (e.g. Coumadin, Plavix, see list of "Blood Thinners"), or if you have an active infection going on, you should not have the procedure.  If you are taking any blood thinners, please inform your physician.  How should I prepare for this procedure?  Do not eat or drink anything at least six hours prior to the procedure.  Bring a driver with you .  It cannot be a taxi.  Come accompanied by an adult that can drive you back, and that is strong enough to help you if your legs get weak or numb from the local anesthetic.  Take all of your medicines the morning of the procedure with just enough water to swallow them.  If you have diabetes, make sure that you are scheduled to have your procedure done first thing in the morning, whenever possible.  If you have diabetes, take only half of your insulin dose and notify our nurse that you have done so as soon as you arrive at the clinic.  If you are diabetic, but only take blood sugar pills (oral hypoglycemic), then do not take them on the morning of your procedure.  You may take them after you have had the procedure.  Do not take aspirin or any aspirin-containing medications, at least eleven (11) days prior to the procedure.  They may prolong bleeding.  Wear loose fitting clothing that may be easy to take off and that you would not mind if it got stained with Betadine or blood.  Do not wear any jewelry or perfume  Remove any nail coloring.  It will interfere with some of our  monitoring equipment.  NOTE: Remember that this is not meant to be interpreted as a complete list of all possible complications.  Unforeseen problems may occur.  BLOOD THINNERS The following drugs contain aspirin or other products, which can cause increased bleeding during surgery and should not be taken for 2 weeks prior to and 1 week after surgery.  If you should need take something for relief of minor pain, you may take acetaminophen which is found in Tylenol,m Datril, Anacin-3 and Panadol. It is not blood thinner. The products listed below are.  Do not take any of the products listed below in addition to any listed on your instruction sheet.  A.P.C or A.P.C with  Codeine Codeine Phosphate Capsules #3 Ibuprofen Ridaura  ABC compound Congesprin Imuran rimadil  Advil Cope Indocin Robaxisal  Alka-Seltzer Effervescent Pain Reliever and Antacid Coricidin or Coricidin-D  Indomethacin Rufen  Alka-Seltzer plus Cold Medicine Cosprin Ketoprofen S-A-C Tablets  Anacin Analgesic Tablets or Capsules Coumadin Korlgesic Salflex  Anacin Extra Strength Analgesic tablets or capsules CP-2 Tablets Lanoril Salicylate  Anaprox Cuprimine Capsules Levenox Salocol  Anexsia-D Dalteparin Magan Salsalate  Anodynos Darvon compound Magnesium Salicylate Sine-off  Ansaid Dasin Capsules Magsal Sodium Salicylate  Anturane Depen Capsules Marnal Soma  APF Arthritis pain formula Dewitt's Pills Measurin Stanback  Argesic Dia-Gesic Meclofenamic Sulfinpyrazone  Arthritis Bayer Timed Release Aspirin Diclofenac Meclomen Sulindac  Arthritis pain formula Anacin Dicumarol Medipren Supac  Analgesic (Safety coated) Arthralgen Diffunasal Mefanamic Suprofen  Arthritis Strength Bufferin Dihydrocodeine Mepro Compound Suprol  Arthropan liquid Dopirydamole Methcarbomol with Aspirin Synalgos  ASA tablets/Enseals Disalcid Micrainin Tagament  Ascriptin Doan's Midol Talwin  Ascriptin A/D Dolene Mobidin Tanderil  Ascriptin Extra Strength  Dolobid Moblgesic Ticlid  Ascriptin with Codeine Doloprin or Doloprin with Codeine Momentum Tolectin  Asperbuf Duoprin Mono-gesic Trendar  Aspergum Duradyne Motrin or Motrin IB Triminicin  Aspirin plain, buffered or enteric coated Durasal Myochrisine Trigesic  Aspirin Suppositories Easprin Nalfon Trillsate  Aspirin with Codeine Ecotrin Regular or Extra Strength Naprosyn Uracel  Atromid-S Efficin Naproxen Ursinus  Auranofin Capsules Elmiron Neocylate Vanquish  Axotal Emagrin Norgesic Verin  Azathioprine Empirin or Empirin with Codeine Normiflo Vitamin E  Azolid Emprazil Nuprin Voltaren  Bayer Aspirin plain, buffered or children's or timed BC Tablets or powders Encaprin Orgaran Warfarin Sodium  Buff-a-Comp Enoxaparin Orudis Zorpin  Buff-a-Comp with Codeine Equegesic Os-Cal-Gesic   Buffaprin Excedrin plain, buffered or Extra Strength Oxalid   Bufferin Arthritis Strength Feldene Oxphenbutazone   Bufferin plain or Extra Strength Feldene Capsules Oxycodone with Aspirin   Bufferin with Codeine Fenoprofen Fenoprofen Pabalate or Pabalate-SF   Buffets II Flogesic Panagesic   Buffinol plain or Extra Strength Florinal or Florinal with Codeine Panwarfarin   Buf-Tabs Flurbiprofen Penicillamine   Butalbital Compound Four-way cold tablets Penicillin   Butazolidin Fragmin Pepto-Bismol   Carbenicillin Geminisyn Percodan   Carna Arthritis Reliever Geopen Persantine   Carprofen Gold's salt Persistin   Chloramphenicol Goody's Phenylbutazone   Chloromycetin Haltrain Piroxlcam   Clmetidine heparin Plaquenil   Cllnoril Hyco-pap Ponstel   Clofibrate Hydroxy chloroquine Propoxyphen         Before stopping any of these medications, be sure to consult the physician who ordered them.  Some, such as Coumadin (Warfarin) are ordered to prevent or treat serious conditions such as "deep thrombosis", "pumonary embolisms", and other heart problems.  The amount of time that you may need off of the medication may also  vary with the medication and the reason for which you were taking it.  If you are taking any of these medications, please make sure you notify your pain physician before you undergo any procedures.   Epidural Steroid Injection Patient Information  Description: The epidural space surrounds the nerves as they exit the spinal cord.  In some patients, the nerves can be compressed and inflamed by a bulging disc or a tight spinal canal (spinal stenosis).  By injecting steroids into the epidural space, we can bring irritated nerves into direct contact with a potentially helpful medication.  These steroids act directly on the irritated nerves and can reduce swelling and inflammation which often leads to decreased pain.  Epidural steroids may be injected anywhere along the spine and from the neck to  the low back depending upon the location of your pain.   After numbing the skin with local anesthetic (like Novocaine), a small needle is passed into the epidural space slowly.  You may experience a sensation of pressure while this is being done.  The entire block usually last less than 10 minutes.  Conditions which may be treated by epidural steroids:   Low back and leg pain  Neck and arm pain  Spinal stenosis  Post-laminectomy syndrome  Herpes zoster (shingles) pain  Pain from compression fractures  Preparation for the injection:  1. Do not eat any solid food or dairy products within 8 hours of your appointment.  2. You may drink clear liquids up to 3 hours before appointment.  Clear liquids include water, black coffee, juice or soda.  No milk or cream please. 3. You may take your regular medication, including pain medications, with a sip of water before your appointment  Diabetics should hold regular insulin (if taken separately) and take 1/2 normal NPH dos the morning of the procedure.  Carry some sugar containing items with you to your appointment. 4. A driver must accompany you and be prepared  to drive you home after your procedure.  5. Bring all your current medications with your. 6. An IV may be inserted and sedation may be given at the discretion of the physician.   7. A blood pressure cuff, EKG and other monitors will often be applied during the procedure.  Some patients may need to have extra oxygen administered for a short period. 8. You will be asked to provide medical information, including your allergies, prior to the procedure.  We must know immediately if you are taking blood thinners (like Coumadin/Warfarin)  Or if you are allergic to IV iodine contrast (dye). We must know if you could possible be pregnant.  Possible side-effects:  Bleeding from needle site  Infection (rare, may require surgery)  Nerve injury (rare)  Numbness & tingling (temporary)  Difficulty urinating (rare, temporary)  Spinal headache ( a headache worse with upright posture)  Light -headedness (temporary)  Pain at injection site (several days)  Decreased blood pressure (temporary)  Weakness in arm/leg (temporary)  Pressure sensation in back/neck (temporary)  Call if you experience:  Fever/chills associated with headache or increased back/neck pain.  Headache worsened by an upright position.  New onset weakness or numbness of an extremity below the injection site  Hives or difficulty breathing (go to the emergency room)  Inflammation or drainage at the infection site  Severe back/neck pain  Any new symptoms which are concerning to you  Please note:  Although the local anesthetic injected can often make your back or neck feel good for several hours after the injection, the pain will likely return.  It takes 3-7 days for steroids to work in the epidural space.  You may not notice any pain relief for at least that one week.  If effective, we will often do a series of three injections spaced 3-6 weeks apart to maximally decrease your pain.  After the initial series, we generally  will wait several months before considering a repeat injection of the same type.  If you have any questions, please call [phone] [facility]

## 2018-09-02 NOTE — Progress Notes (Signed)
Safety precautions to be maintained throughout the outpatient stay will include: orient to surroundings, keep bed in low position, maintain call bell within reach at all times, provide assistance with transfer out of bed and ambulation.  

## 2018-09-07 ENCOUNTER — Telehealth: Payer: Self-pay | Admitting: Family Medicine

## 2018-09-07 NOTE — Telephone Encounter (Addendum)
Copied from [place] [id number]. Topic: Quick Communication - See Telephone Encounter >> Sep 07, 2018 10:10 AM Ivar Drape wrote: CRM for notification. See Telephone encounter for: 09/07/18. Manuela Schwartz w/[facility] [phone] stated she faxed a form over for the patient asking if it would be ok for the patient to stop his ELIQUIS medication for 3 days before his procedure scheduled on 09/14/2018.  She will refax it today and she stated they will need it faxed back with an answer today.

## 2018-09-07 NOTE — Progress Notes (Deleted)
Cardiology Office Note Date:  09/07/2018  Patient ID:  Derrick Murphy, DOB 1966/05/28, MRN [id number] PCP:  Derrick Haven, MD  Cardiologist:  Dr. Rockey Situ, MD Electrophysiologist: Dr. Caryl Comes, MD  ***refresh   Chief Complaint: Follow-up  History of Present Illness: Derrick Murphy is a 53 y.o. male with history of atrial arrhythmias with A. fib/flutter status post prior ablation of the pulmonary veins, cavotricuspid isthmus and roof in 11/2015 on Eliquis, presumed nonischemic cardiomyopathy, syncope, obesity, OSA with intermittent CPAP usage, hypertension, financial issues, ongoing alcohol and tobacco abuse with alcoholic cirrhosis, avascular necrosis of the femoral head, and depression who presents for ***.  He was previously followed by [facility] cardiology though more recently has established with Dr. Rockey Situ and Dr. Caryl Comes.  It appears the patient was transferred to [facility] in 06/2015 with A. fib with RVR and underwent cardioversion followed by amiodarone loading.  Later in that month, due to recurrent symptoms he was reportedly diagnosed with atrial flutter and spontaneously converted to sinus rhythm.  However notes also indicate he underwent unsuccessful cardioversion in 07/2014.  Notes indicate he was having episodes of syncope in 2017 and for this reason underwent ablation in 11/2015.  Details are unclear.  Prior echo in 06/2015 showed an EF of 25 to 30%, moderately dilated LV.  Nuclear stress test at that time showed no inducible ischemia with an EF of 44%.  Follow-up echo in 02/2017 showed improved EF of 50 to 55%, mild LVH, mildly dilated RV with normal RV systolic function, mildly dilated right atrium.  He was last seen in the office in 08/2017 and was dealing with increased stress after the unexpected passing of his mother.  He denied any interval syncope.  He had concerns regarding his ability to afford Eliquis.  He was seen in the ED in 09/2017 with  chest pain.  Troponin was negative.  Ethanol was elevated at 295.  CTA of the chest was negative for PE.  EKG was read as sinus rhythm with no acute ischemia.  He was discharged with outpatient follow-up.  Labs: 05/2018 -WBC 10.6, Hgb 14.8, PLT 321, potassium 4.6, serum creatinine 1.18, AST/ALT normal  ***  Past Medical History:  Diagnosis Date  . Alcohol abuse   . Atrial fibrillation ([place])   . CHF (congestive heart failure) ([place])   . Depression   . Hypertension   . Tobacco abuse     Past Surgical History:  Procedure Laterality Date  . ABLATION    . CARDIOVERSION      No outpatient medications have been marked as taking for the 09/08/18 encounter (Appointment) with Rise Mu, PA-C.    Allergies:   Patient has no known allergies.   Social History:  The patient  reports that he quit smoking about 2 years ago. He has never used smokeless tobacco. He reports current alcohol use of about 2.0 - 3.0 standard drinks of alcohol per week. He reports that he does not use drugs.   Family History:  The patient's family history includes Heart disease in his father, mother, and paternal uncle.  ROS:   ROS   PHYSICAL EXAM: *** VS:  There were no vitals taken for this visit. BMI: There is no height or weight on file to calculate BMI.  Physical Exam   EKG:  Was ordered and interpreted by me today. Shows ***  Recent Labs: 06/02/2018: ALT 20; BUN 22; Creatinine, Ser 1.18; Hemoglobin 14.8; Platelets 321.0; Potassium 4.6; Sodium  139  No results found for requested labs within last 8760 hours.   CrCl cannot be calculated (Patient's most recent lab result is older than the maximum 21 days allowed.).   Wt Readings from Last 3 Encounters:  09/02/18 253 lb 3.2 oz (114.9 kg)  06/02/18 247 lb 12.8 oz (112.4 kg)  04/13/18 245 lb 6.4 oz (111.3 kg)     Other studies reviewed: Additional studies/records reviewed today include: summarized above  ASSESSMENT AND  PLAN:  1. ***  Disposition: F/u with Dr. Rockey Situ or an APP in *** and EP as directed.   Current medicines are reviewed at length with the patient today.  The patient did not have any concerns regarding medicines.  Signed, Christell Faith, PA-C 09/07/2018 7:44 AM     [facility] [place] [address] [place] [address] [place], [place] [address] [phone]

## 2018-09-07 NOTE — Telephone Encounter (Signed)
Placed in RED folder to sign  

## 2018-09-07 NOTE — Telephone Encounter (Signed)
Sent to PCP to advise if OK or not will look for fax.

## 2018-09-07 NOTE — Telephone Encounter (Signed)
Patient can stop eliquis as requested. He should be advised there is a risk of stroke any time eliquis is held given his history of afib. If he develops any stroke symptoms he should be evaluated.

## 2018-09-08 ENCOUNTER — Ambulatory Visit: Payer: BLUE CROSS/BLUE SHIELD | Admitting: Physician Assistant

## 2018-09-08 NOTE — Telephone Encounter (Signed)
Form has been faxed. Called pt and left a VM to call back. CRM created and sent to [facility] pool.

## 2018-09-08 NOTE — Telephone Encounter (Signed)
Called pt and left a detailed VM. Will try to call pt again.

## 2018-09-09 NOTE — Telephone Encounter (Signed)
Called pt and left a VM to call back.  

## 2018-09-11 NOTE — Telephone Encounter (Signed)
calle dpt and left a VM to call back. Left a detailed Vm as well. Mailed pt unable to reach letter.

## 2018-09-14 ENCOUNTER — Encounter: Payer: Self-pay | Admitting: Student in an Organized Health Care Education/Training Program

## 2018-09-14 ENCOUNTER — Ambulatory Visit (HOSPITAL_BASED_OUTPATIENT_CLINIC_OR_DEPARTMENT_OTHER): Payer: BLUE CROSS/BLUE SHIELD | Admitting: Student in an Organized Health Care Education/Training Program

## 2018-09-14 ENCOUNTER — Other Ambulatory Visit: Payer: Self-pay

## 2018-09-14 ENCOUNTER — Ambulatory Visit
Admission: RE | Admit: 2018-09-14 | Discharge: 2018-09-14 | Disposition: A | Discharge status: Home / Self Care | Payer: BLUE CROSS/BLUE SHIELD | Source: Ambulatory Visit | Attending: Student in an Organized Health Care Education/Training Program | Admitting: Student in an Organized Health Care Education/Training Program

## 2018-09-14 VITALS — BP 121/75 | HR 66 | Temp 98.5°F | Resp 66 | Ht 68.0 in | Wt 240.0 lb

## 2018-09-14 DIAGNOSIS — M5416 Radiculopathy, lumbar region: Secondary | ICD-10-CM | POA: Diagnosis not present

## 2018-09-14 DIAGNOSIS — M48061 Spinal stenosis, lumbar region without neurogenic claudication: Secondary | ICD-10-CM

## 2018-09-14 MED ORDER — IOPAMIDOL (ISOVUE-M 200) INJECTION 41%
INTRAMUSCULAR | Status: AC
  Filled 2018-09-14: qty 10

## 2018-09-14 MED ORDER — SODIUM CHLORIDE (PF) 0.9 % IJ SOLN
INTRAMUSCULAR | Status: AC
  Filled 2018-09-14: qty 10

## 2018-09-14 MED ORDER — SODIUM CHLORIDE 0.9% FLUSH
2.0000 mL | Freq: Once | INTRAVENOUS | Status: AC
  Administered 2018-09-14: 10 mL

## 2018-09-14 MED ORDER — ROPIVACAINE HCL 2 MG/ML IJ SOLN
2.0000 mL | Freq: Once | INTRAMUSCULAR | Status: AC
  Administered 2018-09-14: 10 mL via EPIDURAL

## 2018-09-14 MED ORDER — LIDOCAINE HCL 2 % IJ SOLN
10.0000 mL | Freq: Once | INTRAMUSCULAR | Status: AC
  Administered 2018-09-14: 400 mg

## 2018-09-14 MED ORDER — LIDOCAINE HCL 2 % IJ SOLN
INTRAMUSCULAR | Status: AC
  Filled 2018-09-14: qty 20

## 2018-09-14 MED ORDER — ROPIVACAINE HCL 2 MG/ML IJ SOLN
INTRAMUSCULAR | Status: AC
  Filled 2018-09-14: qty 10

## 2018-09-14 MED ORDER — DEXAMETHASONE SODIUM PHOSPHATE 10 MG/ML IJ SOLN
10.0000 mg | Freq: Once | INTRAMUSCULAR | Status: AC
  Administered 2018-09-14: 10 mg

## 2018-09-14 MED ORDER — TIZANIDINE HCL 2 MG PO TABS: 4.0000 mg | ORAL_TABLET | Freq: Two times a day (BID) | ORAL | 2 refills | Status: AC | PRN

## 2018-09-14 MED ORDER — IOPAMIDOL (ISOVUE-M 200) INJECTION 41%
10.0000 mL | Freq: Once | INTRAMUSCULAR | Status: AC
  Administered 2018-09-14: 10 mL via EPIDURAL

## 2018-09-14 MED ORDER — DEXAMETHASONE SODIUM PHOSPHATE 10 MG/ML IJ SOLN
INTRAMUSCULAR | Status: AC
  Filled 2018-09-14: qty 1

## 2018-09-14 NOTE — Progress Notes (Signed)
Patient's Name: Derrick Murphy  MRN: [id number]  Referring Provider: Leone Haven, MD  DOB: 08/13/1965  PCP: Leone Haven, MD  DOS: 09/14/2018  Note by: Gillis Santa, MD  Service setting: Ambulatory outpatient  Specialty: Interventional Pain Management  Patient type: Established  Location: [facility] (AMB) Pain Management Facility  Visit type: Interventional Procedure   Primary Reason for Visit: Interventional Pain Management Treatment. CC: Procedure (Lumbar Epidural Injection) and Back Pain  Procedure:          Anesthesia, Analgesia, Anxiolysis:  Type: Diagnostic Inter-Laminar Epidural Steroid Injection  #1  Region: Lumbar Level: L5-S1 Level. Laterality: Left-Sided          Local Anesthetic: Lidocaine 1-2%  Position: Prone with head of the table was raised to facilitate breathing.   Indications: 1. Lumbar radiculopathy   2. Lumbar foraminal stenosis    Pain Score: Pre-procedure: 2 /10 Post-procedure: 2 /10  Patient stopped Eliquis 3 days prior to scheduled ESI.  Pre-op Assessment:  Mr. Derrick Murphy is a 53 y.o. (year old), male patient, seen today for interventional treatment. He  has a past surgical history that includes Cardioversion and Ablation. Mr. Derrick Murphy has a current medication list which includes the following prescription(s): cyclobenzaprine, eliquis, gabapentin, lisinopril, magnesium oxide, metoprolol succinate, tamsulosin, tizanidine, and misc natural products. His primarily concern today is the Procedure (Lumbar Epidural Injection) and Back Pain  Initial Vital Signs:  Pulse/HCG Rate: 64  Temp: 98.5 F (36.9 C) Resp: (!) 100 BP: 120/68 SpO2: 100 %  BMI: Estimated body mass index is 36.49 kg/m as calculated from the following:   Height as of this encounter: 5\' 8"  (1.727 m).   Weight as of this encounter: 240 lb (108.9 kg).  Risk Assessment: Allergies: Reviewed. He has No Known Allergies.  Allergy Precautions: None required Coagulopathies: Reviewed. None  identified.  Blood-thinner therapy: None at this time Active Infection(s): Reviewed. None identified. Mr. Derrick Murphy is afebrile  Site Confirmation: Mr. Derrick Murphy was asked to confirm the procedure and laterality before marking the site Procedure checklist: Completed Consent: Before the procedure and under the influence of no sedative(s), amnesic(s), or anxiolytics, the patient was informed of the treatment options, risks and possible complications. To fulfill our ethical and legal obligations, as recommended by the American Medical Association's Code of Ethics, I have informed the patient of my clinical impression; the nature and purpose of the treatment or procedure; the risks, benefits, and possible complications of the intervention; the alternatives, including doing nothing; the risk(s) and benefit(s) of the alternative treatment(s) or procedure(s); and the risk(s) and benefit(s) of doing nothing. The patient was provided information about the general risks and possible complications associated with the procedure. These may include, but are not limited to: failure to achieve desired goals, infection, bleeding, organ or nerve damage, allergic reactions, paralysis, and death. In addition, the patient was informed of those risks and complications associated to Spine-related procedures, such as failure to decrease pain; infection (i.e.: Meningitis, epidural or intraspinal abscess); bleeding (i.e.: epidural hematoma, subarachnoid hemorrhage, or any other type of intraspinal or peri-dural bleeding); organ or nerve damage (i.e.: Any type of peripheral nerve, nerve root, or spinal cord injury) with subsequent damage to sensory, motor, and/or autonomic systems, resulting in permanent pain, numbness, and/or weakness of one or several areas of the body; allergic reactions; (i.e.: anaphylactic reaction); and/or death. Furthermore, the patient was informed of those risks and complications associated with the medications.  These include, but are not limited to: allergic reactions (i.e.: anaphylactic or  anaphylactoid reaction(s)); adrenal axis suppression; blood sugar elevation that in diabetics may result in ketoacidosis or comma; water retention that in patients with history of congestive heart failure may result in shortness of breath, pulmonary edema, and decompensation with resultant heart failure; weight gain; swelling or edema; medication-induced neural toxicity; particulate matter embolism and blood vessel occlusion with resultant organ, and/or nervous system infarction; and/or aseptic necrosis of one or more joints. Finally, the patient was informed that Medicine is not an exact science; therefore, there is also the possibility of unforeseen or unpredictable risks and/or possible complications that may result in a catastrophic outcome. The patient indicated having understood very clearly. We have given the patient no guarantees and we have made no promises. Enough time was given to the patient to ask questions, all of which were answered to the patient's satisfaction. Mr. Derrick Murphy has indicated that he wanted to continue with the procedure. Attestation: I, the ordering provider, attest that I have discussed with the patient the benefits, risks, side-effects, alternatives, likelihood of achieving goals, and potential problems during recovery for the procedure that I have provided informed consent. Date  Time:   Pre-Procedure Preparation:  Monitoring: As per clinic protocol. Respiration, ETCO2, SpO2, BP, heart rate and rhythm monitor placed and checked for adequate function Safety Precautions: Patient was assessed for positional comfort and pressure points before starting the procedure. Time-out: I initiated and conducted the "Time-out" before starting the procedure, as per protocol. The patient was asked to participate by confirming the accuracy of the "Time Out" information. Verification of the correct person, site, and  procedure were performed and confirmed by me, the nursing staff, and the patient. "Time-out" conducted as per Joint Commission's Universal Protocol (UP.01.01.01). Time: 2440  Description of Procedure:          Target Area: The interlaminar space, initially targeting the lower laminar border of the superior vertebral body. Approach: Paramedial approach. Area Prepped: Entire Posterior Lumbar Region Prepping solution: ChloraPrep (2% chlorhexidine gluconate and 70% isopropyl alcohol) Safety Precautions: Aspiration looking for blood return was conducted prior to all injections. At no point did we inject any substances, as a needle was being advanced. No attempts were made at seeking any paresthesias. Safe injection practices and needle disposal techniques used. Medications properly checked for expiration dates. SDV (single dose vial) medications used. Description of the Procedure: Protocol guidelines were followed. The procedure needle was introduced through the skin, ipsilateral to the reported pain, and advanced to the target area. Bone was contacted and the needle walked caudad, until the lamina was cleared. The epidural space was identified using "loss-of-resistance technique" with 2-3 ml of PF-NaCl (0.9% NSS), in a 5cc LOR glass syringe.  Vitals:   09/14/18 0900 09/14/18 0933 09/14/18 0938 09/14/18 0943  BP:  128/69 111/80 121/75  Pulse: 69 67 65 66  Resp: (!) 68 (!) 68 (!) 67 (!) 66  Temp:      SpO2: 98% 97% 95% 96%  Weight:      Height:        Start Time: 0933 hrs. End Time: 0940 hrs.  Materials:  Needle(s) Type: Epidural needle Gauge: 17G Length: 5-in Medication(s): Please see orders for medications and dosing details. 9 cc solution consisting of 6 cc of preservative-free saline, 2 cc of 0.2% ropivacaine, 1 cc of Decadron 10 mg/cc.  Imaging Guidance (Spinal):          Type of Imaging Technique: Fluoroscopy Guidance (Spinal) Indication(s): Assistance in needle guidance and  placement for procedures  requiring needle placement in or near specific anatomical locations not easily accessible without such assistance. Exposure Time: Please see nurses notes. Contrast: Before injecting any contrast, we confirmed that the patient did not have an allergy to iodine, shellfish, or radiological contrast. Once satisfactory needle placement was completed at the desired level, radiological contrast was injected. Contrast injected under live fluoroscopy. No contrast complications. See chart for type and volume of contrast used. Fluoroscopic Guidance: I was personally present during the use of fluoroscopy. "Tunnel Vision Technique" used to obtain the best possible view of the target area. Parallax error corrected before commencing the procedure. "Direction-depth-direction" technique used to introduce the needle under continuous pulsed fluoroscopy. Once target was reached, antero-posterior, oblique, and lateral fluoroscopic projection used confirm needle placement in all planes. Images permanently stored in EMR. Interpretation: I personally interpreted the imaging intraoperatively. Adequate needle placement confirmed in multiple planes. Appropriate spread of contrast into desired area was observed. No evidence of afferent or efferent intravascular uptake. No intrathecal or subarachnoid spread observed. Permanent images saved into the patient's record.  Antibiotic Prophylaxis:   Anti-infectives (From admission, onward)   None     Indication(s): None identified  Post-operative Assessment:  Post-procedure Vital Signs:  Pulse/HCG Rate: 66  Temp: 98.5 F (36.9 C) Resp: (!) 66 BP: 121/75 SpO2: 96 %  EBL: None  Complications: No immediate post-treatment complications observed by team, or reported by patient.  Note: The patient tolerated the entire procedure well. A repeat set of vitals were taken after the procedure and the patient was kept under observation following institutional  policy, for this type of procedure. Post-procedural neurological assessment was performed, showing return to baseline, prior to discharge. The patient was provided with post-procedure discharge instructions, including a section on how to identify potential problems. Should any problems arise concerning this procedure, the patient was given instructions to immediately contact us, at any time, without hesitation. In any case, we plan to contact the patient by telephone for a follow-up status report regarding this interventional procedure.  Comments:  No additional relevant information. Patient instructed to restart Eliquis tomorrow after nursing phone call and so long as he is not having any new lower extremity weakness Plan of Care   Imaging Orders     DG C-Arm 1-60 Min-No Report Procedure Orders    No procedure(s) ordered today   5 out of 5 strength bilateral lower extremity: Plantar flexion, dorsiflexion, knee flexion, knee extension.  Medications ordered for procedure: Meds ordered this encounter  Medications  . iopamidol (ISOVUE-M) 41 % intrathecal injection 10 mL  . ropivacaine (PF) 2 mg/mL (0.2%) (NAROPIN) injection 2 mL  . sodium chloride flush (NS) 0.9 % injection 2 mL  . lidocaine (XYLOCAINE) 2 % (with pres) injection 200 mg  . dexamethasone (DECADRON) injection 10 mg  . tiZANidine (ZANAFLEX) 2 MG tablet    Sig: Take 2 tablets (4 mg total) by mouth 2 (two) times daily as needed for muscle spasms.    Dispense:  120 tablet    Refill:  2    Do not place medication on "Automatic Refill". Fill one day early if pharmacy is closed on scheduled refill date.   Medications administered: We administered iopamidol, ropivacaine (PF) 2 mg/mL (0.2%), sodium chloride flush, lidocaine, and dexamethasone.  See the medical record for exact dosing, route, and time of administration.  Disposition: Discharge home  Discharge Date & Time: 09/14/2018; 0948 hrs.   Physician-requested Follow-up: Return  in about 4 weeks (around 10/12/2018) for Post  Procedure Evaluation.  Future Appointments  Date Time Provider [place]  10/08/2018  9:30 AM Deboraha Sprang, MD [id number]  10/12/2018  1:45 PM Gillis Santa, MD [facility] None   Primary Care Physician: Leone Haven, MD Location: [facility] Note by: Gillis Santa, MD Date: 09/14/2018; Time: 1:49 PM  Disclaimer:  Medicine is not an exact science. The only guarantee in medicine is that nothing is guaranteed. It is important to note that the decision to proceed with this intervention was based on the information collected from the patient. The Data and conclusions were drawn from the patient's questionnaire, the interview, and the physical examination. Because the information was provided in large part by the patient, it cannot be guaranteed that it has not been purposely or unconsciously manipulated. Every effort has been made to obtain as much relevant data as possible for this evaluation. It is important to note that the conclusions that lead to this procedure are derived in large part from the available data. Always take into account that the treatment will also be dependent on availability of resources and existing treatment guidelines, considered by other Pain Management Practitioners as being common knowledge and practice, at the time of the intervention. For Medico-Legal purposes, it is also important to point out that variation in procedural techniques and pharmacological choices are the acceptable norm. The indications, contraindications, technique, and results of the above procedure should only be interpreted and judged by a Board-Certified Interventional Pain Specialist with extensive familiarity and expertise in the same exact procedure and technique.

## 2018-09-14 NOTE — Progress Notes (Signed)
Safety precautions to be maintained throughout the outpatient stay will include: orient to surroundings, keep bed in low position, maintain call bell within reach at all times, provide assistance with transfer out of bed and ambulation.  

## 2018-09-14 NOTE — Patient Instructions (Signed)

## 2018-09-15 ENCOUNTER — Other Ambulatory Visit: Payer: Self-pay

## 2018-09-15 ENCOUNTER — Telehealth: Payer: Self-pay

## 2018-09-15 ENCOUNTER — Telehealth: Payer: Self-pay | Admitting: Student in an Organized Health Care Education/Training Program

## 2018-09-15 ENCOUNTER — Observation Stay
Admission: EM | Admit: 2018-09-15 | Discharge: 2018-09-16 | Disposition: A | Discharge status: Home / Self Care | Payer: BLUE CROSS/BLUE SHIELD | Attending: Internal Medicine | Admitting: Internal Medicine

## 2018-09-15 ENCOUNTER — Emergency Department: Payer: BLUE CROSS/BLUE SHIELD

## 2018-09-15 ENCOUNTER — Encounter: Payer: Self-pay | Admitting: Emergency Medicine

## 2018-09-15 DIAGNOSIS — Z79899 Other long term (current) drug therapy: Secondary | ICD-10-CM | POA: Insufficient documentation

## 2018-09-15 DIAGNOSIS — I110 Hypertensive heart disease with heart failure: Secondary | ICD-10-CM | POA: Insufficient documentation

## 2018-09-15 DIAGNOSIS — Z9119 Patient's noncompliance with other medical treatment and regimen: Secondary | ICD-10-CM | POA: Diagnosis not present

## 2018-09-15 DIAGNOSIS — K7460 Unspecified cirrhosis of liver: Secondary | ICD-10-CM | POA: Diagnosis present

## 2018-09-15 DIAGNOSIS — I10 Essential (primary) hypertension: Secondary | ICD-10-CM | POA: Diagnosis present

## 2018-09-15 DIAGNOSIS — I483 Typical atrial flutter: Secondary | ICD-10-CM | POA: Insufficient documentation

## 2018-09-15 DIAGNOSIS — I4891 Unspecified atrial fibrillation: Secondary | ICD-10-CM | POA: Diagnosis not present

## 2018-09-15 DIAGNOSIS — Z7901 Long term (current) use of anticoagulants: Secondary | ICD-10-CM | POA: Diagnosis not present

## 2018-09-15 DIAGNOSIS — I5022 Chronic systolic (congestive) heart failure: Secondary | ICD-10-CM | POA: Diagnosis not present

## 2018-09-15 DIAGNOSIS — Z87891 Personal history of nicotine dependence: Secondary | ICD-10-CM | POA: Diagnosis not present

## 2018-09-15 DIAGNOSIS — N400 Enlarged prostate without lower urinary tract symptoms: Secondary | ICD-10-CM | POA: Diagnosis not present

## 2018-09-15 DIAGNOSIS — Z6830 Body mass index (BMI) 30.0-30.9, adult: Secondary | ICD-10-CM | POA: Diagnosis not present

## 2018-09-15 DIAGNOSIS — F329 Major depressive disorder, single episode, unspecified: Secondary | ICD-10-CM | POA: Diagnosis not present

## 2018-09-15 DIAGNOSIS — G4733 Obstructive sleep apnea (adult) (pediatric): Secondary | ICD-10-CM | POA: Diagnosis not present

## 2018-09-15 DIAGNOSIS — Z9989 Dependence on other enabling machines and devices: Secondary | ICD-10-CM

## 2018-09-15 DIAGNOSIS — Z8249 Family history of ischemic heart disease and other diseases of the circulatory system: Secondary | ICD-10-CM | POA: Diagnosis not present

## 2018-09-15 DIAGNOSIS — I480 Paroxysmal atrial fibrillation: Secondary | ICD-10-CM

## 2018-09-15 DIAGNOSIS — F1010 Alcohol abuse, uncomplicated: Secondary | ICD-10-CM | POA: Diagnosis not present

## 2018-09-15 DIAGNOSIS — Z9889 Other specified postprocedural states: Secondary | ICD-10-CM | POA: Insufficient documentation

## 2018-09-15 LAB — ETHANOL: Alcohol, Ethyl (B): <10 mg/dL (ref <10)

## 2018-09-15 LAB — BASIC METABOLIC PANEL
Anion gap: 15 (ref 5–15)
BUN: 23 mg/dL — ABNORMAL HIGH (ref 6–20)
CO2: 19 mmol/L — ABNORMAL LOW (ref 22–32)
Calcium: 8.9 mg/dL (ref 8.9–10.3)
Chloride: 100 mmol/L (ref 98–111)
Creatinine, Ser: 1.08 mg/dL (ref 0.61–1.24)
GFR calc Af Amer: >60 mL/min (ref >60)
GFR calc non Af Amer: >60 mL/min (ref >60)
Glucose, Bld: 93 mg/dL (ref 70–99)
Potassium: 3.5 mmol/L (ref 3.5–5.1)
Sodium: 134 mmol/L — ABNORMAL LOW (ref 135–145)

## 2018-09-15 LAB — CBC
HCT: 45.6 % (ref 39.0–52.0)
Hemoglobin: 15.4 g/dL (ref 13.0–17.0)
MCH: 31.2 pg (ref 26.0–34.0)
MCHC: 33.8 g/dL (ref 30.0–36.0)
MCV: 92.3 fL (ref 80.0–100.0)
Platelets: 270 10*3/uL (ref 150–400)
RBC: 4.94 MIL/uL (ref 4.22–5.81)
RDW: 14 % (ref 11.5–15.5)
WBC: 15.6 10*3/uL — ABNORMAL HIGH (ref 4.0–10.5)
nRBC: 0 % (ref 0.0–0.2)

## 2018-09-15 LAB — TROPONIN I: Troponin I: <0.03 ng/mL (ref <0.03)

## 2018-09-15 MED ORDER — MAGNESIUM SULFATE 2 GM/50ML IV SOLN
2.0000 g | Freq: Once | INTRAVENOUS | Status: AC
  Administered 2018-09-15: 2 g via INTRAVENOUS
  Filled 2018-09-15: qty 50

## 2018-09-15 MED ORDER — LORAZEPAM 2 MG/ML IJ SOLN
1.0000 mg | Freq: Once | INTRAMUSCULAR | Status: AC
  Administered 2018-09-15: 1 mg via INTRAVENOUS
  Filled 2018-09-15: qty 1

## 2018-09-15 MED ORDER — ACETAMINOPHEN 650 MG RE SUPP: 650.0000 mg | Freq: Four times a day (QID) | RECTAL | Status: DC | PRN

## 2018-09-15 MED ORDER — MAGNESIUM OXIDE 400 (241.3 MG) MG PO TABS
400.0000 mg | ORAL_TABLET | Freq: Every day | ORAL | Status: DC
  Administered 2018-09-16: 400 mg via ORAL
  Filled 2018-09-15: qty 1

## 2018-09-15 MED ORDER — ONDANSETRON HCL 4 MG PO TABS: 4.0000 mg | ORAL_TABLET | Freq: Four times a day (QID) | ORAL | Status: DC | PRN

## 2018-09-15 MED ORDER — DILTIAZEM HCL-DEXTROSE 100-5 MG/100ML-% IV SOLN (PREMIX)
5.0000 mg/h | INTRAVENOUS | Status: DC
  Administered 2018-09-15 – 2018-09-16 (×2): 5 mg/h via INTRAVENOUS
  Filled 2018-09-15 (×2): qty 100

## 2018-09-15 MED ORDER — THIAMINE HCL 100 MG/ML IJ SOLN
100.0000 mg | Freq: Once | INTRAMUSCULAR | Status: AC
  Administered 2018-09-15: 100 mg via INTRAVENOUS
  Filled 2018-09-15: qty 2

## 2018-09-15 MED ORDER — SODIUM CHLORIDE 0.9% FLUSH: 3.0000 mL | Freq: Once | INTRAVENOUS | Status: DC

## 2018-09-15 MED ORDER — FOLIC ACID 1 MG PO TABS
1.0000 mg | ORAL_TABLET | Freq: Once | ORAL | Status: AC
  Administered 2018-09-15: 1 mg via ORAL
  Filled 2018-09-15: qty 1

## 2018-09-15 MED ORDER — ONDANSETRON HCL 4 MG/2ML IJ SOLN
4.0000 mg | Freq: Once | INTRAMUSCULAR | Status: AC | PRN
  Administered 2018-09-15: 4 mg via INTRAVENOUS

## 2018-09-15 MED ORDER — APIXABAN 5 MG PO TABS
5.0000 mg | ORAL_TABLET | Freq: Two times a day (BID) | ORAL | Status: DC
  Administered 2018-09-15 – 2018-09-16 (×2): 5 mg via ORAL
  Filled 2018-09-15 (×2): qty 1

## 2018-09-15 MED ORDER — METOPROLOL TARTRATE 5 MG/5ML IV SOLN
5.0000 mg | Freq: Once | INTRAVENOUS | Status: AC
  Administered 2018-09-15: 5 mg via INTRAVENOUS
  Filled 2018-09-15: qty 5

## 2018-09-15 MED ORDER — ACETAMINOPHEN 325 MG PO TABS
650.0000 mg | ORAL_TABLET | Freq: Four times a day (QID) | ORAL | Status: DC | PRN
  Administered 2018-09-15: 650 mg via ORAL
  Filled 2018-09-15: qty 2

## 2018-09-15 MED ORDER — SODIUM CHLORIDE 0.9 % IV BOLUS
1000.0000 mL | Freq: Once | INTRAVENOUS | Status: AC
  Administered 2018-09-15: 1000 mL via INTRAVENOUS

## 2018-09-15 MED ORDER — TAMSULOSIN HCL 0.4 MG PO CAPS: 0.4000 mg | ORAL_CAPSULE | Freq: Every day | ORAL | Status: DC

## 2018-09-15 MED ORDER — ONDANSETRON HCL 4 MG/2ML IJ SOLN
INTRAMUSCULAR | Status: AC
  Filled 2018-09-15: qty 2

## 2018-09-15 MED ORDER — ONDANSETRON HCL 4 MG/2ML IJ SOLN: 4.0000 mg | Freq: Four times a day (QID) | INTRAMUSCULAR | Status: DC | PRN

## 2018-09-15 MED ORDER — GABAPENTIN 300 MG PO CAPS
300.0000 mg | ORAL_CAPSULE | Freq: Two times a day (BID) | ORAL | Status: DC
  Administered 2018-09-15 – 2018-09-16 (×2): 300 mg via ORAL
  Filled 2018-09-15 (×2): qty 1

## 2018-09-15 MED ORDER — SODIUM CHLORIDE 0.9 % IV SOLN
1.0000 mg | Freq: Once | INTRAVENOUS | Status: DC
  Filled 2018-09-15: qty 0.2

## 2018-09-15 MED ORDER — METOPROLOL SUCCINATE ER 50 MG PO TB24
25.0000 mg | ORAL_TABLET | Freq: Two times a day (BID) | ORAL | Status: DC
  Administered 2018-09-16: 25 mg via ORAL
  Filled 2018-09-15: qty 1

## 2018-09-15 NOTE — Telephone Encounter (Signed)
Spoke with patient and he states he is feeling very dizzy and light headed.  States his pain is feeling some better.  Denies fever.  States he is drinking a lot of water to stay hydrated.  Patient had a LESI  yesterday. Will discuss with Dr Holley Raring.

## 2018-09-15 NOTE — ED Provider Notes (Addendum)
[facility] Emergency Department Provider Note  ____________________________________________   I have reviewed the triage vital signs and the nursing notes. Where available I have reviewed prior notes and, if possible and indicated, outside hospital notes.    HISTORY  Chief Complaint Tachycardia    HPI Derrick Murphy is a 53 y.o. male history of EtOH abuse he does not drink every day anymore he states he is cut back but he did drink several drinks last night, history of CHF atrial fibrillation depression hypertension chronic back pain He woke up this morning after drinking last night and found him still to be back in A. fib.  He does not usually associate this was drinking but he has been here before for it, including on the day after Christmas.  Patient states he has no chest pain he does not feel short of breath he feels the fluttering in his heart.  He is on Eliquis and states he is compliant with it.  He denies any fever chills nausea vomiting diarrhea or other complaints.  He would like to have some ice chips.  Patient states he has been cardioverted 4 times a day "never works".  He is taking his medications including metoprolol as prescribed he states. No other antecedent intervention no associated symptoms no other complaints   Past Medical History:  Diagnosis Date  . Alcohol abuse   . Atrial fibrillation ([place])   . CHF (congestive heart failure) ([place])   . Depression   . Hypertension   . Tobacco abuse     Patient Active Problem List   Diagnosis Date Noted  . Lumbar radiculopathy 09/02/2018  . Spinal stenosis of lumbar region without neurogenic claudication 09/02/2018  . Lumbar foraminal stenosis 09/02/2018  . Lumbar degenerative disc disease 09/02/2018  . Hepatic cirrhosis ([place]) 04/14/2018  . Finger deformity, acquired, right 04/14/2018  . Multiple joint pain 04/14/2018  . Skin lesion 10/17/2017  . Anxiety 10/17/2017  . Tachycardia 02/18/2017  .  Atrial fibrillation ([place]) 08/12/2016  . Obesity (BMI 30-39.9) 08/12/2016  . History of alcohol abuse 08/12/2016  . Former smoker 08/12/2016  . Chronic systolic CHF (congestive heart failure) ([place]) 08/12/2016  . Benign essential HTN 08/12/2016  . OSA on CPAP 08/12/2016  . BPH (benign prostatic hyperplasia) 08/12/2016    Past Surgical History:  Procedure Laterality Date  . ABLATION    . CARDIOVERSION      Prior to Admission medications   Medication Sig Start Date End Date Taking? Authorizing Provider  cyclobenzaprine (FLEXERIL) 5 MG tablet Take 1 tablet (5 mg total) by mouth 3 (three) times daily as needed for muscle spasms. 06/02/18   Guse, Jacquelynn Cree, FNP  ELIQUIS 5 MG TABS tablet TAKE 1 TABLET BY MOUTH TWICE A DAY 05/11/18   Leone Haven, MD  gabapentin (NEURONTIN) 300 MG capsule 300 mg nightly for 2 weeks then increase to 300 mg twice daily if no side effects. 09/02/18   Gillis Santa, MD  lisinopril (PRINIVIL,ZESTRIL) 10 MG tablet TAKE 1 TABLET BY MOUTH EVERY DAY 08/12/18   Leone Haven, MD  magnesium oxide (MAG-OX) 400 MG tablet Take 400 mg by mouth daily.    [provider]  metoprolol succinate (TOPROL-XL) 25 MG 24 hr tablet Take 1 tablet (25 mg total) by mouth 2 (two) times daily. 03/03/18   Deboraha Sprang, MD  Misc Natural Products (GLUCOSAMINE CHOND COMPLEX/MSM PO) Take by mouth.    [provider]  tamsulosin (FLOMAX) 0.4 MG CAPS capsule  TAKE 1 CAPSULE BY MOUTH EVERYDAY AT BEDTIME 04/14/18   Leone Haven, MD  tiZANidine (ZANAFLEX) 2 MG tablet Take 2 tablets (4 mg total) by mouth 2 (two) times daily as needed for muscle spasms. 09/14/18 12/13/18  Gillis Santa, MD    Allergies Patient has no known allergies.  Family History  Problem Relation Age of Onset  . Heart disease Mother   . Heart disease Father   . Heart disease Paternal Uncle     Social History Social History   Tobacco Use  . Smoking status: Former Smoker    Last attempt to  quit: 06/15/2016    Years since quitting: 2.2  . Smokeless tobacco: Never Used  Substance Use Topics  . Alcohol use: Yes    Alcohol/week: 2.0 - 3.0 standard drinks    Types: 2 - 3 Standard drinks or equivalent per week    Comment: occassional  . Drug use: No    Review of Systems Constitutional: No fever/chills Eyes: No visual changes. ENT: No sore throat. No stiff neck no neck pain Cardiovascular: Denies chest pain. Respiratory: Denies shortness of breath. Gastrointestinal:   no vomiting.  No diarrhea.  No constipation. Genitourinary: Negative for dysuria. Musculoskeletal: Negative lower extremity swelling Skin: Negative for rash. Neurological: Negative for severe headaches, focal weakness or numbness.   ____________________________________________   PHYSICAL EXAM:  VITAL SIGNS: ED Triage Vitals  Enc Vitals Group     BP 09/15/18 1825 123/66     Pulse Rate 09/15/18 1825 (!) 165     Resp 09/15/18 1825 (!) 24     Temp 09/15/18 1825 98.6 F (37 C)     Temp Source 09/15/18 1825 Oral     SpO2 09/15/18 1825 95 %     Weight --      Height --      Head Circumference --      Peak Flow --      Pain Score 09/15/18 1821 0     Pain Loc --      Pain Edu? --      Excl. in [place]? --     Constitutional: Alert and oriented. Well appearing and in no acute distress. Eyes: Conjunctivae are normal Head: Atraumatic HEENT: No congestion/rhinnorhea. Mucous membranes are moist.  Oropharynx non-erythematous Neck:   Nontender with no meningismus, no masses, no stridor Cardiovascular: Irregularly irregular,  normal axis. Grossly normal heart sounds.  Good peripheral circulation. Respiratory: Normal respiratory effort.  No retractions. Lungs CTAB. Abdominal: Soft and nontender. No distention. No guarding no rebound Back:  There is no focal tenderness or step off.  there is no midline tenderness there are no lesions noted. there is no CVA tenderness  Musculoskeletal: No lower extremity  tenderness, no upper extremity tenderness. No joint effusions, no DVT signs strong distal pulses no edema Neurologic:  Normal speech and language. No gross focal neurologic deficits are appreciated.  Skin:  Skin is warm, dry and intact. No rash noted. Psychiatric: Mood and affect are normal. Speech and behavior are normal.  ____________________________________________   LABS (all labs ordered are listed, but only abnormal results are displayed)  Labs Reviewed  BASIC METABOLIC PANEL - Abnormal; Notable for the following components:      Result Value   Sodium 134 (*)    CO2 19 (*)    BUN 23 (*)    All other components within normal limits  CBC - Abnormal; Notable for the following components:   WBC 15.6 (*)  All other components within normal limits  TROPONIN I  ETHANOL    Pertinent labs  results that were available during my care of the patient were reviewed by me and considered in my medical decision making (see chart for details). ____________________________________________  EKG  I personally interpreted any EKGs ordered by me or triage most likely atrial fibrillation rate 165 no acute ST elevation depression ____________________________________________  RADIOLOGY  Pertinent labs & imaging results that were available during my care of the patient were reviewed by me and considered in my medical decision making (see chart for details). If possible, patient and/or family made aware of any abnormal findings.  Dg Chest Portable 1 View  Result Date: 09/15/2018 CLINICAL DATA:  Atrial fibrillation EXAM: PORTABLE CHEST 1 VIEW COMPARISON:  10/04/2017 FINDINGS: Heart is borderline in size. Lungs clear. No effusions or acute bony abnormality. IMPRESSION: Borderline heart size.  No active disease. Electronically Signed   By: Rolm Baptise M.D.   On: 09/15/2018 19:00   ____________________________________________    PROCEDURES  Procedure(s) performed:  None  Procedures  Critical Care performed: CRITICAL CARE Performed by: Schuyler Amor   Total critical care time: 48 minutes  Critical care time was exclusive of separately billable procedures and treating other patients.  Critical care was necessary to treat or prevent imminent or life-threatening deterioration.  Critical care was time spent personally by me on the following activities: development of treatment plan with patient and/or surrogate as well as nursing, discussions with consultants, evaluation of patient's response to treatment, examination of patient, obtaining history from patient or surrogate, ordering and performing treatments and interventions, ordering and review of laboratory studies, ordering and review of radiographic studies, pulse oximetry and re-evaluation of patient's condition.   ____________________________________________   INITIAL IMPRESSION / ASSESSMENT AND PLAN / ED COURSE  Pertinent labs & imaging results that were available during my care of the patient were reviewed by me and considered in my medical decision making (see chart for details).   Well-appearing patient with likely has some element of holiday heart with atrial fibrillation after drinking last night.  History of recurrent atrial fibrillation, does not appear to be in withdrawal but we will give him Ativan, we will give him metoprolol to bring down his rate even though his pressures are good he has no evidence of CHF clinically, we will he is already anticoagulated.  I have offered him possible cardioversion, but he refuses.  He understands risk of its own alternatives of both the procedure and refused the procedure.  He would be a good candidate as his symptoms began this morning and he is already anticoagulated however, as he does not want that done we will treat him medically.  Sometimes with fluids magnesium and a little metoprolol we can get him to break or at least control his rate and that  is what we are doing.  Patient will also be given Ativan in case there is some early withdrawal although I do not see any clinical evidence of significant withdrawal at this time blood work is reassuring and patient is nontoxic in appearance.  ----------------------------------------- 8:35 PM on 09/15/2018 -----------------------------------------  Pete EKG shows a narrow complex tachycardia at 145 on the monitor he staying at 145 I do not see any ST elevation or depression this is medically its flutter with 2-1 conduction.  I have tried Lopressor x2 magnesium IV fluids and he is still in this rhythm which can happen and can persist.  Again he is  anticoagulated again he is having no chest pain or shortness of breath, I talk to Dr. Jannifer Franklin, who requested a Cardizem drip which we will start and we will continue to observe.  Patient will be admitted    ____________________________________________   FINAL CLINICAL IMPRESSION(S) / ED DIAGNOSES  Final diagnoses:  None      This chart was dictated using voice recognition software.  Despite best efforts to proofread,  errors can occur which can change meaning.      Schuyler Amor, MD 09/15/18 1919    Schuyler Amor, MD 09/15/18 2036

## 2018-09-15 NOTE — Telephone Encounter (Signed)
Patient spoke with Dr Holley Raring.

## 2018-09-15 NOTE — ED Notes (Signed)
MD at bedside. 

## 2018-09-15 NOTE — Telephone Encounter (Signed)
Pt called left voicemail stating that he had an epidural yesterday and is experiencing dizziness and doesn't feel right and would like a nurse to call him back.

## 2018-09-15 NOTE — ED Notes (Signed)
Pt given cup of ice, ok per MD.  Given a urinal, stated he needed to urinate.

## 2018-09-15 NOTE — H&P (Signed)
[place] [facility] [place] NAME: Derrick Murphy    MR#:  [id number]  DATE OF BIRTH:  24-Jun-1966  DATE OF ADMISSION:  09/15/2018  PRIMARY CARE PHYSICIAN: Leone Haven, MD   REQUESTING/REFERRING PHYSICIAN: Burlene Arnt, MD  CHIEF COMPLAINT:   Chief Complaint  Patient presents with  . Tachycardia    HISTORY OF PRESENT ILLNESS:  Derrick Murphy  is a 53 y.o. male who presents with chief complaint as above.  Patient has a history of A. fib, and states that he is gone in RVR in the past.  For the last 24 hours he has felt "funny".  He states that initially he thought this was due to after-effects from an epidural he got yesterday, which he receives for chronic pain.  However, throughout the day he did start to become a little more short of breath, and that occlude him into the fact that this is likely his heart.  Here in the ED he was found to be in A. fib with RVR.  He was given several doses of nodal blocking agents and IV push form, with no effect in bringing down his heart rate.  He was then placed on a Cardizem drip and hospitalist were called for admission  PAST MEDICAL HISTORY:   Past Medical History:  Diagnosis Date  . Alcohol abuse   . Atrial fibrillation ([place])   . CHF (congestive heart failure) ([place])   . Depression   . Hypertension   . Tobacco abuse      PAST SURGICAL HISTORY:   Past Surgical History:  Procedure Laterality Date  . ABLATION    . CARDIOVERSION       SOCIAL HISTORY:   Social History   Tobacco Use  . Smoking status: Former Smoker    Last attempt to quit: 06/15/2016    Years since quitting: 2.2  . Smokeless tobacco: Never Used  Substance Use Topics  . Alcohol use: Yes    Alcohol/week: 2.0 - 3.0 standard drinks    Types: 2 - 3 Standard drinks or equivalent per week    Comment: occassional     FAMILY HISTORY:   Family History  Problem Relation Age of Onset  . Heart disease Mother   . Heart  disease Father   . Heart disease Paternal Uncle      DRUG ALLERGIES:  No Known Allergies  MEDICATIONS AT HOME:   Prior to Admission medications   Medication Sig Start Date End Date Taking? Authorizing Provider  ELIQUIS 5 MG TABS tablet TAKE 1 TABLET BY MOUTH TWICE A DAY 05/11/18  Yes Leone Haven, MD  gabapentin (NEURONTIN) 300 MG capsule 300 mg nightly for 2 weeks then increase to 300 mg twice daily if no side effects. Patient taking differently: Take 300 mg by mouth 2 (two) times daily.  09/02/18  Yes Gillis Santa, MD  lisinopril (PRINIVIL,ZESTRIL) 10 MG tablet TAKE 1 TABLET BY MOUTH EVERY DAY 08/12/18  Yes Leone Haven, MD  magnesium oxide (MAG-OX) 400 MG tablet Take 400 mg by mouth daily.   Yes [provider]  metoprolol succinate (TOPROL-XL) 25 MG 24 hr tablet Take 1 tablet (25 mg total) by mouth 2 (two) times daily. 03/03/18  Yes Deboraha Sprang, MD  Misc Natural Products (GLUCOSAMINE CHOND COMPLEX/MSM PO) Take by mouth.   Yes [provider]  tamsulosin (FLOMAX) 0.4 MG CAPS capsule TAKE 1 CAPSULE BY MOUTH EVERYDAY AT BEDTIME 04/14/18  Yes Tommi Rumps  G, MD  tiZANidine (ZANAFLEX) 2 MG tablet Take 2 tablets (4 mg total) by mouth 2 (two) times daily as needed for muscle spasms. 09/14/18 12/13/18 Yes Gillis Santa, MD    REVIEW OF SYSTEMS:  Review of Systems  Constitutional: Negative for chills, fever, malaise/fatigue and weight loss.  HENT: Negative for ear pain, hearing loss and tinnitus.   Eyes: Negative for blurred vision, double vision, pain and redness.  Respiratory: Positive for shortness of breath. Negative for cough and hemoptysis.   Cardiovascular: Negative for chest pain, palpitations, orthopnea and leg swelling.  Gastrointestinal: Negative for abdominal pain, constipation, diarrhea, nausea and vomiting.  Genitourinary: Negative for dysuria, frequency and hematuria.  Musculoskeletal: Negative for back pain, joint pain and neck pain.  Skin:        No acne, rash, or lesions  Neurological: Negative for dizziness, tremors, focal weakness and weakness.  Endo/Heme/Allergies: Negative for polydipsia. Does not bruise/bleed easily.  Psychiatric/Behavioral: Negative for depression. The patient is not nervous/anxious and does not have insomnia.      VITAL SIGNS:   Vitals:   09/15/18 1917 09/15/18 1930 09/15/18 2000 09/15/18 2030  BP: 106/77 126/86 (!) 128/91 125/90  Pulse: (!) 42 66 (!) 147   Resp:    16  Temp:      TempSrc:      SpO2: 96% 97% 91%    Wt Readings from Last 3 Encounters:  09/14/18 108.9 kg  09/02/18 114.9 kg  06/02/18 112.4 kg    PHYSICAL EXAMINATION:  Physical Exam  Vitals reviewed. Constitutional: He is oriented to person, place, and time. He appears well-developed and well-nourished. No distress.  HENT:  Head: Normocephalic and atraumatic.  Mouth/Throat: Oropharynx is clear and moist.  Eyes: Pupils are equal, round, and reactive to light. Conjunctivae and EOM are normal. No scleral icterus.  Neck: Normal range of motion. Neck supple. No JVD present. No thyromegaly present.  Cardiovascular: Intact distal pulses. Exam reveals no gallop and no friction rub.  No murmur heard. Tachycardic, irregular rhythm  Respiratory: Effort normal and breath sounds normal. No respiratory distress. He has no wheezes. He has no rales.  GI: Soft. Bowel sounds are normal. He exhibits no distension. There is no abdominal tenderness.  Musculoskeletal: Normal range of motion.        General: No edema.     Comments: No arthritis, no gout  Lymphadenopathy:    He has no cervical adenopathy.  Neurological: He is alert and oriented to person, place, and time. No cranial nerve deficit.  No dysarthria, no aphasia  Skin: Skin is warm and dry. No rash noted. No erythema.  Psychiatric: He has a normal mood and affect. His behavior is normal. Judgment and thought content normal.    LABORATORY PANEL:   CBC Recent Labs  Lab  09/15/18 1828  WBC 15.6*  HGB 15.4  HCT 45.6  PLT 270   ------------------------------------------------------------------------------------------------------------------  Chemistries  Recent Labs  Lab 09/15/18 1828  NA 134*  K 3.5  CL 100  CO2 19*  GLUCOSE 93  BUN 23*  CREATININE 1.08  CALCIUM 8.9   ------------------------------------------------------------------------------------------------------------------  Cardiac Enzymes Recent Labs  Lab 09/15/18 1828  TROPONINI <0.03   ------------------------------------------------------------------------------------------------------------------  RADIOLOGY:  Dg Chest Portable 1 View  Result Date: 09/15/2018 CLINICAL DATA:  Atrial fibrillation EXAM: PORTABLE CHEST 1 VIEW COMPARISON:  10/04/2017 FINDINGS: Heart is borderline in size. Lungs clear. No effusions or acute bony abnormality. IMPRESSION: Borderline heart size.  No active disease. Electronically Signed  By: Rolm Baptise M.D.   On: 09/15/2018 19:00   Dg C-arm 1-60 Min-no Report  Result Date: 09/14/2018 Fluoroscopy was utilized by the requesting physician.  No radiographic interpretation.    EKG:   Orders placed or performed during the hospital encounter of 09/15/18  . ED EKG  . ED EKG  . EKG 12-Lead  . EKG 12-Lead  . EKG 12-Lead  . EKG 12-Lead    IMPRESSION AND PLAN:  Principal Problem:   Atrial fibrillation with RVR ([facility]) -patient received several doses of IV Lopressor, and then was placed on a Cardizem drip.  His rate is still accelerated from the 130s to the 150s.  We will continue with Cardizem drip for now, though we will switch this over to amiodarone if his heart rate does not control over the next couple of hours.  I have ordered a cardiology consult for the morning Active Problems:   Chronic systolic CHF (congestive heart failure) ([place]) -continue home meds   Benign essential HTN -home dose antihypertensives   OSA on CPAP -CPAP nightly   BPH  (benign prostatic hyperplasia) -continue home meds   Hepatic cirrhosis ([place]) -avoid hepatotoxins  Chart review performed and case discussed with ED provider. Labs, imaging and/or ECG reviewed by provider and discussed with patient/family. Management plans discussed with the patient and/or family.  DVT PROPHYLAXIS: Systemic anticoagulation  GI PROPHYLAXIS:  None  ADMISSION STATUS: Observation  CODE STATUS: Full  TOTAL TIME TAKING CARE OF THIS PATIENT: 40 minutes.   Ethlyn Daniels 09/15/2018, 9:09 PM  [facility]  Office  [phone]  CC: Primary care physician; Leone Haven, MD  Note:  This document was prepared using Dragon voice recognition software and may include unintentional dictation errors.

## 2018-09-15 NOTE — ED Notes (Signed)
Pt has hx of a fib, takes metoprolol for it. Has NOT missed any doses. Denies CP. States SOB. Denies weight gain or leg swelling. Ablation about 5 years ago. States "I think it's time for another." pt denies ever being on a drip to control HR or being admitted for a fib. Talking in complete sentences. A&O, able to move on own.   Taken to xray at this time via stretcher.

## 2018-09-15 NOTE — ED Notes (Signed)
Pt brought back from xray. Xray tech states was unable to perform scan, wants scan to be performed in room d/t pt breaking out in sweat and vomiting x 1.

## 2018-09-15 NOTE — ED Triage Notes (Signed)
Pt c/o heart flutter and racing all day. Hx of a-fib . Pt had spinal epidural yesterday for back issues. Denies any CP . HR 180 in triage.

## 2018-09-15 NOTE — ED Notes (Signed)
Report received from [facility]. Patient care assumed. Patient/RN introduction complete. Pt co headache rates it 4/10, HR 126 afib on monitor, Cardizem infusing well with IV intact. Pt aware of admission disposition and that he will be holding int he ED till inpatient bed becomes available. Will position pt for comfort and continue to monitor.

## 2018-09-15 NOTE — Telephone Encounter (Signed)
Patient states that he is sore. Encouraged heat today and instructed him that it could take up to a week for the steroids to start working. Pt to call back with any problems or concerns

## 2018-09-15 NOTE — ED Notes (Signed)
Dr. Willis at bedside  

## 2018-09-16 ENCOUNTER — Observation Stay (HOSPITAL_BASED_OUTPATIENT_CLINIC_OR_DEPARTMENT_OTHER)
Admit: 2018-09-16 | Discharge: 2018-09-16 | Disposition: A | Discharge status: Home / Self Care | Payer: BLUE CROSS/BLUE SHIELD | Attending: Internal Medicine | Admitting: Internal Medicine

## 2018-09-16 DIAGNOSIS — G4733 Obstructive sleep apnea (adult) (pediatric): Secondary | ICD-10-CM

## 2018-09-16 DIAGNOSIS — I10 Essential (primary) hypertension: Secondary | ICD-10-CM

## 2018-09-16 DIAGNOSIS — I4892 Unspecified atrial flutter: Secondary | ICD-10-CM | POA: Diagnosis not present

## 2018-09-16 DIAGNOSIS — K7460 Unspecified cirrhosis of liver: Secondary | ICD-10-CM | POA: Diagnosis not present

## 2018-09-16 DIAGNOSIS — I4891 Unspecified atrial fibrillation: Secondary | ICD-10-CM | POA: Diagnosis not present

## 2018-09-16 LAB — BASIC METABOLIC PANEL
Anion gap: 8 (ref 5–15)
BUN: 20 mg/dL (ref 6–20)
CO2: 27 mmol/L (ref 22–32)
Calcium: 8.4 mg/dL — ABNORMAL LOW (ref 8.9–10.3)
Chloride: 103 mmol/L (ref 98–111)
Creatinine, Ser: 1.06 mg/dL (ref 0.61–1.24)
GFR calc Af Amer: >60 mL/min (ref >60)
GFR calc non Af Amer: >60 mL/min (ref >60)
Glucose, Bld: 92 mg/dL (ref 70–99)
Potassium: 4.3 mmol/L (ref 3.5–5.1)
Sodium: 138 mmol/L (ref 135–145)

## 2018-09-16 LAB — CBC
HCT: 45.3 % (ref 39.0–52.0)
Hemoglobin: 14.9 g/dL (ref 13.0–17.0)
MCH: 30.7 pg (ref 26.0–34.0)
MCHC: 32.9 g/dL (ref 30.0–36.0)
MCV: 93.2 fL (ref 80.0–100.0)
Platelets: 207 10*3/uL (ref 150–400)
RBC: 4.86 MIL/uL (ref 4.22–5.81)
RDW: 13.8 % (ref 11.5–15.5)
WBC: 11 10*3/uL — ABNORMAL HIGH (ref 4.0–10.5)
nRBC: 0 % (ref 0.0–0.2)

## 2018-09-16 LAB — ECHOCARDIOGRAM COMPLETE

## 2018-09-16 MED ORDER — DILTIAZEM HCL ER COATED BEADS 180 MG PO CP24: 180.0000 mg | ORAL_CAPSULE | Freq: Every day | ORAL | 0 refills | Status: DC

## 2018-09-16 MED ORDER — MAGNESIUM SULFATE 2 GM/50ML IV SOLN
2.0000 g | Freq: Once | INTRAVENOUS | Status: AC
  Administered 2018-09-16: 2 g via INTRAVENOUS
  Filled 2018-09-16: qty 50

## 2018-09-16 MED ORDER — DILTIAZEM HCL 30 MG PO TABS: 30.0000 mg | ORAL_TABLET | Freq: Two times a day (BID) | ORAL | 0 refills | Status: DC | PRN

## 2018-09-16 MED ORDER — DILTIAZEM HCL ER COATED BEADS 180 MG PO CP24
180.0000 mg | ORAL_CAPSULE | ORAL | Status: AC
  Administered 2018-09-16: 180 mg via ORAL
  Filled 2018-09-16 (×2): qty 1

## 2018-09-16 MED ORDER — MAGNESIUM SULFATE 2 GM/50ML IV SOLN: 2.0000 g | Freq: Once | INTRAVENOUS | Status: DC

## 2018-09-16 MED ORDER — GABAPENTIN 300 MG PO CAPS: 300.0000 mg | ORAL_CAPSULE | Freq: Two times a day (BID) | ORAL | Status: DC

## 2018-09-16 NOTE — Progress Notes (Signed)
*  PRELIMINARY RESULTS* Echocardiogram 2D Echocardiogram has been performed.  Sherrie Sport 09/16/2018, 9:06 AM

## 2018-09-16 NOTE — Discharge Summary (Signed)
[facility] [place] NAME: Derrick Murphy    MR#:  [id number]  DATE OF BIRTH:  [birth year]  DATE OF ADMISSION:  09/15/2018 ADMITTING PHYSICIAN: Dr Lance Coon  DATE OF DISCHARGE: 09/16/2018  PRIMARY CARE PHYSICIAN: Leone Haven, MD    ADMISSION DIAGNOSIS:  tachycardia  DISCHARGE DIAGNOSIS:  Principal Problem:   Atrial fibrillation with RVR (HCC) Active Problems:   Chronic systolic CHF (congestive heart failure) (HCC)   Benign essential HTN   OSA on CPAP   BPH (benign prostatic hyperplasia)   Hepatic cirrhosis ([place])   SECONDARY DIAGNOSIS:   Past Medical History:  Diagnosis Date  . Alcohol abuse   . Atrial fibrillation ([place])   . CHF (congestive heart failure) ([place])   . Depression   . Hypertension   . Tobacco abuse     HOSPITAL COURSE:   1.  Atrial fibrillation with rapid ventricular response.  Patient was placed on Cardizem drip overnight.  Patient on 5 mg an hour when I saw him earlier.  Patient was interested in getting out of the hospital.  His heart rate was better controlled.  I ordered Cardizem CD 180 mg daily.  We will taper off the Cardizem drip.  We will give IV magnesium prior to disposition.  Patient already on Eliquis.  Continue metoprolol.  Case discussed with cardiology Dr. Rockey Situ who also saw the patient.  They will follow-up as outpatient.  Patient recently had a steroid injection in his back and has been taking some pseudoephedrine. 2.  Essential hypertension.  Continue home doses of antihypertensive medications and added Cardizem CD. 3.  BPH on Flomax 4.  Sleep apnea on CPAP nightly 5.  Chronic systolic congestive heart failure.  No signs of heart failure currently.  Last ejection fraction was normal.  Current echocardiogram pending. 6.  Liver cirrhosis seen on previous ultrasound and CT scan.  Patient discharged from the emergency room.  Never went into a inpatient hospital bed. DISCHARGE CONDITIONS:    Satisfactory  CONSULTS OBTAINED:  Treatment Team:  Minna Merritts, MD  DRUG ALLERGIES:  No Known Allergies  DISCHARGE MEDICATIONS:   Allergies as of 09/16/2018   No Known Allergies     Medication List    TAKE these medications   diltiazem 180 MG 24 hr capsule Commonly known as:  CARDIZEM CD Take 1 capsule (180 mg total) by mouth daily.   diltiazem 30 MG tablet Commonly known as:  CARDIZEM Take 1 tablet (30 mg total) by mouth 2 (two) times daily as needed (Hr greater than 130).   ELIQUIS 5 MG Tabs tablet Generic drug:  apixaban TAKE 1 TABLET BY MOUTH TWICE A DAY   gabapentin 300 MG capsule Commonly known as:  NEURONTIN Take 1 capsule (300 mg total) by mouth 2 (two) times daily.   GLUCOSAMINE CHOND COMPLEX/MSM PO Take by mouth.   lisinopril 10 MG tablet Commonly known as:  PRINIVIL,ZESTRIL TAKE 1 TABLET BY MOUTH EVERY DAY   magnesium oxide 400 MG tablet Commonly known as:  MAG-OX Take 400 mg by mouth daily.   metoprolol succinate 25 MG 24 hr tablet Commonly known as:  TOPROL-XL Take 1 tablet (25 mg total) by mouth 2 (two) times daily.   tamsulosin 0.4 MG Caps capsule Commonly known as:  FLOMAX TAKE 1 CAPSULE BY MOUTH EVERYDAY AT BEDTIME   tiZANidine 2 MG tablet Commonly known as:  ZANAFLEX Take 2 tablets (4 mg total) by mouth 2 (two) times daily  as needed for muscle spasms.        DISCHARGE INSTRUCTIONS:   Follow-up PMD 5 days Follow-up cardiology as scheduled  If you experience worsening of your admission symptoms, develop shortness of breath, life threatening emergency, suicidal or homicidal thoughts you must seek medical attention immediately by calling 911 or calling your MD immediately  if symptoms less severe.  You Must read complete instructions/literature along with all the possible adverse reactions/side effects for all the Medicines you take and that have been prescribed to you. Take any new Medicines after you have completely  understood and accept all the possible adverse reactions/side effects.   Please note  You were cared for by a hospitalist during your hospital stay. If you have any questions about your discharge medications or the care you received while you were in the hospital after you are discharged, you can call the unit and asked to speak with the hospitalist on call if the hospitalist that took care of you is not available. Once you are discharged, your primary care physician will handle any further medical issues. Please note that NO REFILLS for any discharge medications will be authorized once you are discharged, as it is imperative that you return to your primary care physician (or establish a relationship with a primary care physician if you do not have one) for your aftercare needs so that they can reassess your need for medications and monitor your lab values.    Today   CHIEF COMPLAINT:   Chief Complaint  Patient presents with  . Tachycardia    HISTORY OF PRESENT ILLNESS:  Derrick Murphy  is a 53 y.o. male presented not feeling well and found to be in rapid atrial fibrillation   VITAL SIGNS:  Blood pressure 113/66, pulse 87, temperature 98.6 F (37 C), temperature source Oral, resp. rate 15, SpO2 96 %.    PHYSICAL EXAMINATION:  GENERAL:  53 y.o.-year-old patient lying in the bed with no acute distress.  EYES: Pupils equal, round, reactive to light and accommodation. No scleral icterus. Extraocular muscles intact.  HEENT: Head atraumatic, normocephalic. Oropharynx and nasopharynx clear.  NECK:  Supple, no jugular venous distention. No thyroid enlargement, no tenderness.  LUNGS: Normal breath sounds bilaterally, no wheezing, rales,rhonchi or crepitation. No use of accessory muscles of respiration.  CARDIOVASCULAR: S1, S2 irregularly irregular. No murmurs, rubs, or gallops.  ABDOMEN: Soft, non-tender, non-distended. Bowel sounds present. No organomegaly or mass.  EXTREMITIES: Trace  pedal edema, no cyanosis, or clubbing.  NEUROLOGIC: Cranial nerves II through XII are intact. Muscle strength 5/5 in all extremities. Sensation intact. Gait not checked.  PSYCHIATRIC: The patient is alert and oriented x 3.  SKIN: No obvious rash, lesion, or ulcer.   DATA REVIEW:   CBC Recent Labs  Lab 09/16/18 0448  WBC 11.0*  HGB 14.9  HCT 45.3  PLT 207    Chemistries  Recent Labs  Lab 09/16/18 0448  NA 138  K 4.3  CL 103  CO2 27  GLUCOSE 92  BUN 20  CREATININE 1.06  CALCIUM 8.4*    Cardiac Enzymes Recent Labs  Lab 09/15/18 1828  TROPONINI <0.03     RADIOLOGY:  Dg Chest Portable 1 View  Result Date: 09/15/2018 CLINICAL DATA:  Atrial fibrillation EXAM: PORTABLE CHEST 1 VIEW COMPARISON:  10/04/2017 FINDINGS: Heart is borderline in size. Lungs clear. No effusions or acute bony abnormality. IMPRESSION: Borderline heart size.  No active disease. Electronically Signed   By: Rolm Baptise M.D.  On: 09/15/2018 19:00    Management plans discussed with the patient, and he is in agreement.  CODE STATUS:     Code Status Orders  (From admission, onward)         Start     Ordered   09/15/18 2310  Full code  Continuous     09/15/18 2309        Code Status History    This patient has a current code status but no historical code status.      TOTAL TIME TAKING CARE OF THIS PATIENT: 35 minutes.    Loletha Grayer M.D on 09/16/2018 at 12:21 PM  Between 7am to 6pm - Pager - [phone]  After 6pm go to [email or web address] - password [email or web address]  [facility] Office  [phone]  CC: Primary care physician; Leone Haven, MD

## 2018-09-16 NOTE — ED Notes (Signed)
Cardizem decreased per Dr. Marcille Blanco, HR 77 on monitor.

## 2018-09-16 NOTE — ED Notes (Signed)
Pt given lunch tray at this time

## 2018-09-16 NOTE — ED Notes (Signed)
Pt given meal tray from dietary at this time.  

## 2018-09-16 NOTE — Consult Note (Addendum)
Cardiology Consultation:   Patient ID: Derrick Murphy MRN: [id number]; DOB: [birth year]  Admit date: 09/15/2018 Date of Consult: 09/16/2018  Primary Care Provider: Leone Haven, MD Primary Cardiologist: Rockey Situ Primary Electrophysiologist:  Caryl Comes Reason for consult: Atrial flutter with RVR Physician requesting consult: Dr. Leslye Peer   Patient Profile:   Derrick Murphy is a 53 y.o. male with a hx of atrial fibrillation and atrial flutter, prior ablation Last seen by myself February 2018, by Dr. Caryl Comes in February 2019,  Who presents with atrial flutter, rapid rate, persistent, shortness of breath  History of Present Illness:   Derrick Murphy reports that he has had paroxysmal atrial flutter over the past year.  Typically arrhythmia will not last and it resolves on its own without intervention.  Reports he has been compliant with his Eliquis 5 twice daily and metoprolol succinate twice daily  Recently held Eliquis for 3 days for cortisone infusion Yesterday had malaise, nausea vomiting Reports that he took some Sudafed, 2 pills In that setting developed tachycardia.  Symptoms did not resolve and he presented to the emergency room  EKG on arrival showing atrial flutter rate 160 bpm In the emergency room has been started on diltiazem infusion for rate control,  Transitioned to oral diltiazem  Other past medical history reviewed History of ablation May 2017 presumably for atrial fibrillation  alcohol abuse,  obesity,  sleep apnea with noncompliance on his CPAP   07/11/15 he presented to an OSH with AF with RVR.  He underwent a cardioversion and was placed on amiodarone.  He was admitted to [facility] on 07/17/15 due to recurrent symptoms after transfer from [facility]. He was dx with atrial flutter at that time. He spontaneously converted. He underwent another cardioversion January 2016 which he states failed.  He was having episodes of syncope and for this reason underwent  ablation May 2017 Notes indicating He has a h/o HFrEF with EF of 25-30%.    continues to drink ETOH  ECG from 07/15/15 and 07/16/15 revealed typical atrial flutter with variable conduction  He was in atrial flutter February 2018 Most EKG since that time including February 2019 was normal sinus rhythm  Echo 07/17/15 The left ventricle is moderately dilated. There is normal left ventricular wall thickness. Left ventricular systolic function is severely reduced. LV ejection fraction = 25-30%. There is severe global hypokinesis of the left ventricle.   Past Medical History:  Diagnosis Date  . Alcohol abuse   . Atrial fibrillation ([place])   . CHF (congestive heart failure) ([place])    8/18: EF 50-55%, mild LVH, RA/RV- mild dilation  . Depression   . Hypertension   . Tobacco abuse     Past Surgical History:  Procedure Laterality Date  . ABLATION    . CARDIOVERSION       Home Medications:  Prior to Admission medications   Medication Sig Start Date End Date Taking? Authorizing Provider  ELIQUIS 5 MG TABS tablet TAKE 1 TABLET BY MOUTH TWICE A DAY 05/11/18  Yes Derrick Haven, MD  lisinopril (PRINIVIL,ZESTRIL) 10 MG tablet TAKE 1 TABLET BY MOUTH EVERY DAY 08/12/18  Yes Derrick Haven, MD  magnesium oxide (MAG-OX) 400 MG tablet Take 400 mg by mouth daily.   Yes [provider]  metoprolol succinate (TOPROL-XL) 25 MG 24 hr tablet Take 1 tablet (25 mg total) by mouth 2 (two) times daily. 03/03/18  Yes Deboraha Sprang, MD  Misc Natural Products (GLUCOSAMINE CHOND COMPLEX/MSM PO) Take by mouth.  Yes [provider]  tamsulosin (FLOMAX) 0.4 MG CAPS capsule TAKE 1 CAPSULE BY MOUTH EVERYDAY AT BEDTIME 04/14/18  Yes Derrick Haven, MD  tiZANidine (ZANAFLEX) 2 MG tablet Take 2 tablets (4 mg total) by mouth 2 (two) times daily as needed for muscle spasms. 09/14/18 12/13/18 Yes Gillis Santa, MD  diltiazem (CARDIZEM CD) 180 MG 24 hr capsule Take 1 capsule (180 mg total)  by mouth daily. 09/16/18 09/16/19  Loletha Grayer, MD  diltiazem (CARDIZEM) 30 MG tablet Take 1 tablet (30 mg total) by mouth 2 (two) times daily as needed (Hr greater than 130). 09/16/18 09/16/19  Loletha Grayer, MD  gabapentin (NEURONTIN) 300 MG capsule Take 1 capsule (300 mg total) by mouth 2 (two) times daily. 09/16/18   Loletha Grayer, MD    Inpatient Medications: Scheduled Meds: . apixaban  5 mg Oral BID  . gabapentin  300 mg Oral BID  . magnesium oxide  400 mg Oral Daily  . metoprolol succinate  25 mg Oral BID  . tamsulosin  0.4 mg Oral QPC supper   Continuous Infusions: . diltiazem (CARDIZEM) infusion Stopped (09/16/18 1256)   PRN Meds: acetaminophen **OR** acetaminophen, ondansetron **OR** ondansetron (ZOFRAN) IV  Allergies:   No Known Allergies  Social History:   Social History   Socioeconomic History  . Marital status: Legally Separated    Spouse name: Not on file  . Number of children: Not on file  . Years of education: Not on file  . Highest education level: Not on file  Occupational History  . Not on file  Social Needs  . Financial resource strain: Not on file  . Food insecurity:    Worry: Not on file    Inability: Not on file  . Transportation needs:    Medical: Not on file    Non-medical: Not on file  Tobacco Use  . Smoking status: Former Smoker    Last attempt to quit: 06/15/2016    Years since quitting: 2.2  . Smokeless tobacco: Never Used  Substance and Sexual Activity  . Alcohol use: Yes    Alcohol/week: 2.0 - 3.0 standard drinks    Types: 2 - 3 Standard drinks or equivalent per week    Comment: occassional  . Drug use: No  . Sexual activity: Yes    Partners: Female  Lifestyle  . Physical activity:    Days per week: Not on file    Minutes per session: Not on file  . Stress: Not on file  Relationships  . Social connections:    Talks on phone: Not on file    Gets together: Not on file    Attends religious service: Not on file    Active  member of club or organization: Not on file    Attends meetings of clubs or organizations: Not on file    Relationship status: Not on file  . Intimate partner violence:    Fear of current or ex partner: Not on file    Emotionally abused: Not on file    Physically abused: Not on file    Forced sexual activity: Not on file  Other Topics Concern  . Not on file  Social History Narrative  . Not on file    Family History:    Family History  Problem Relation Age of Onset  . Heart disease Mother   . Heart disease Father   . Heart disease Paternal Uncle      ROS:  Please see the history of  present illness.  Review of Systems  Constitutional: Negative.   Respiratory: Positive for shortness of breath.   Cardiovascular: Positive for palpitations.  Gastrointestinal: Negative.   Musculoskeletal: Negative.   Neurological: Negative.   Psychiatric/Behavioral: Negative.   All other systems reviewed and are negative.    Physical Exam/Data:   Vitals:   09/16/18 1230 09/16/18 1415 09/16/18 1446 09/16/18 1449  BP:  126/81    Pulse:   85   Resp: 17     Temp:    98.5 F (36.9 C)  TempSrc:    Oral  SpO2:   96%     Intake/Output Summary (Last 24 hours) at 09/16/2018 1551 Last data filed at 09/16/2018 1417 Gross per 24 hour  Intake 1186.92 ml  Output 650 ml  Net 536.92 ml   Last 3 Weights 09/14/2018 09/02/2018 06/02/2018  Weight (lbs) 240 lb 253 lb 3.2 oz 247 lb 12.8 oz  Weight (kg) 108.863 kg 114.851 kg 112.401 kg     There is no height or weight on file to calculate BMI.  General:  Well nourished, well developed, in no acute distress HEENT: normal Lymph: no adenopathy Neck: no JVD Endocrine:  No thryomegaly Vascular: No carotid bruits; FA pulses 2+ bilaterally without bruits  Cardiac:  normal S1, S2; RRR; no murmur  Lungs:  clear to auscultation bilaterally, no wheezing, rhonchi or rales  Abd: soft, nontender, no hepatomegaly  Ext: no edema Musculoskeletal:  No deformities,  BUE and BLE strength normal and equal Skin: warm and dry  Neuro:  CNs 2-12 intact, no focal abnormalities noted Psych:  Normal affect   EKG:  The EKG was personally reviewed and demonstrates: Atrial flutter with ventricular rate 160 bpm  Telemetry:  Telemetry was personally reviewed and demonstrates: Atrial flutter, rate 80 to 90 bpm  Relevant CV Studies: Echocardiogram personally reviewed by myself  normal LV function, normal RV function No significant valve disease  Laboratory Data:  Chemistry Recent Labs  Lab 09/15/18 1828 09/16/18 0448  NA 134* 138  K 3.5 4.3  CL 100 103  CO2 19* 27  GLUCOSE 93 92  BUN 23* 20  CREATININE 1.08 1.06  CALCIUM 8.9 8.4*  GFRNONAA >60 >60  GFRAA >60 >60  ANIONGAP 15 8    No results for input(s): PROT, ALBUMIN, AST, ALT, ALKPHOS, BILITOT in the last 168 hours. Hematology Recent Labs  Lab 09/15/18 1828 09/16/18 0448  WBC 15.6* 11.0*  RBC 4.94 4.86  HGB 15.4 14.9  HCT 45.6 45.3  MCV 92.3 93.2  MCH 31.2 30.7  MCHC 33.8 32.9  RDW 14.0 13.8  PLT 270 207   Cardiac Enzymes Recent Labs  Lab 09/15/18 1828  TROPONINI <0.03   No results for input(s): TROPIPOC in the last 168 hours.  BNPNo results for input(s): BNP, PROBNP in the last 168 hours.  DDimer No results for input(s): DDIMER in the last 168 hours.  Radiology/Studies:  Dg Chest Portable 1 View  Result Date: 09/15/2018 CLINICAL DATA:  Atrial fibrillation EXAM: PORTABLE CHEST 1 VIEW COMPARISON:  10/04/2017 FINDINGS: Heart is borderline in size. Lungs clear. No effusions or acute bony abnormality. IMPRESSION: Borderline heart size.  No active disease. Electronically Signed   By: Rolm Baptise M.D.   On: 09/15/2018 19:00   Dg C-arm 1-60 Min-no Report  Result Date: 09/14/2018 Fluoroscopy was utilized by the requesting physician.  No radiographic interpretation.    Assessment and Plan:   1. Atrial flutter with RVR Paroxysmal, long history of atrial  flutter -Normal LV  function on echocardiogram, ejection fraction greater than 55% this admission Unable to offer cardioversion given he held anticoagulation for 3 days for cortisone injection in his back 2 days ago -Symptoms possibly exacerbated by taking Sudafed x2 pills yesterday also in the setting of nausea vomiting -Appears to have adequate rate control with diltiazem extended release 180 mg and is metoprolol succinate 25 twice daily -He is back on his Eliquis 5 twice daily -Ambulating in the emergency room with reasonable heart rate control -Recommended diltiazem 30 mg pills as needed for breakthrough tachycardia --Clinic close follow-up in clinic If he does not convert spontaneously to normal sinus rhythm, may need cardioversion --He reports he has an appointment with Dr. Caryl Comes in 3 weeks time As he reports having frequent paroxysmal episodes over the past year, he may need antiarrhythmics versus repeat ablation  2) alcohol abuse Recommended alcohol cessation  3) essential hypertension Well-controlled on diltiazem and metoprolol Would hold lisinopril  4) smoker Smoking cessation recommended  5) liver cirrhosis Seen on previous ultrasound and CT scan Alcohol cessation recommended  6) morbid obesity/obstructive sleep apnea Not on CPAP Risk factor for arrhythmia  Long discussion with him in the emergency room concerning restoring normal sinus rhythm, versus rate control Discussed whether to stay in the hospital or go home with current medications, rate control Case discussed with nursing and with hospitalist service, Dr. Leslye Peer  Total encounter time more than 110 minutes  Greater than 50% was spent in counseling and coordination of care with the patient   For questions or updates, please contact [facility] Please consult [email or web address] for contact info under     Signed, Ida Rogue, MD  09/16/2018 3:51 PM

## 2018-09-16 NOTE — ED Notes (Signed)
Pt standing walking around in room maintaining a HR around 100 bpm.

## 2018-09-16 NOTE — ED Notes (Signed)
Pharmacy contacted regarding medications. When PO Cardizem administered wait 1 hr decrease iv cardizen to 2.5, wait 1 hr then discontinue iv cardizem if tolerated.

## 2018-09-16 NOTE — ED Notes (Signed)
Pt up to bathroom without difficulty, states "I am feeling back to normal".

## 2018-09-16 NOTE — ED Notes (Signed)
Pt resting quietly with eyes closed, no distress noted.  

## 2018-09-17 ENCOUNTER — Telehealth: Payer: Self-pay | Admitting: Internal Medicine

## 2018-09-17 ENCOUNTER — Telehealth: Payer: Self-pay

## 2018-09-17 LAB — HIV ANTIBODY (ROUTINE TESTING W REFLEX): HIV Screen 4th Generation wRfx: NONREACTIVE

## 2018-09-17 NOTE — Telephone Encounter (Signed)
New Message  Pt verbalized he was in the ED and wants to see Dr. Caryl Comes. Looked at both sites and pt has soonest appointment available.  Per ED notes it states to keep appt but to make appt with PCP, pt verbalized it's more than a month out before he see's PCP and wanted to know if he can be worked in sooner than 3.17.20, his current appt with Dr. Caryl Comes.  Please f/u

## 2018-09-17 NOTE — Telephone Encounter (Signed)
Copied from [place] [id number]. Topic: Appointment Scheduling - Scheduling Inquiry for Clinic >> Sep 17, 2018 10:01 AM Rutherford Nail, NT wrote: Reason for CRM: Patient calling for hospital follow up within 5 days. Dr Caryl Bis out of office next week. Please advise.

## 2018-09-18 NOTE — Telephone Encounter (Signed)
I attempted to call the patient. I left a message for him to call the office back.  I did go ahead an r/s his appointment to 09/22/18 at 10:15 am with Dr. Caryl Comes due to a cancellation.  I did not leave this on his unidentified voice mail.

## 2018-09-18 NOTE — Telephone Encounter (Addendum)
Transition Care Management Follow-up Telephone Call  How have you been since you were released from the hospital? Patient says heart rate is good today art normal rhythm, had episode this morning heart racing and increased BP took the PRN Cardizem and with in 30 minutes heart came down and BP. Advised patient symptoms worsen over weekend to return to ED,   Do you understand why you were in the hospital? yes   Do you understand the discharge instrcutions? yes  Items Reviewed:  Medications reviewed: yes  Allergies reviewed: yes  Dietary changes reviewed: yes  Referrals reviewed: yes   Functional Questionnaire:   Activities of Daily Living (ADLs):   He states they are independent in the following: ambulation, bathing and hygiene, feeding, continence, grooming, toileting and dressing States they require assistance with the following: Patient back at work.   Any transportation issues/concerns?: no   Any patient concerns? no   Confirmed importance and date/time of follow-up visits scheduled: yes   Confirmed with patient if condition begins to worsen call PCP or go to the ER.  Patient was given the Call-a-Nurse line [phone]: yes

## 2018-09-21 NOTE — Progress Notes (Signed)
Patient Care Team: Leone Haven, MD as PCP - General (Family Medicine) Minna Merritts, MD as Consulting Physician (Cardiology)   HPI  Derrick Murphy is a 53 y.o. male Seen in follow-up for atrial arrhythmias. He has undergone prior ablation pulmonary veins, cava tricuspid isthmus and roof. 6/17  [facility]  Interval tachycardia cared for at [facility].  ECG 7/18 >> narrow QRS tachycardia with flutter 2;1  he is anticoagulated with apixoban    DATE TEST EF   12/16 Echo   25-30 %   8/18 Echo   50-55 %   September 22, 2022 Echo 60-65 %      Date Cr Hgb TSH  7/18 0.65 14.7 1.59  09/22/22 1.06 14.9     His mother died unfortunately not unexpectedly.  He is dealing with that.  Reminds me last summer that his girlfriend died.  Seen in ER 2022-09-22 for recurrent atrial fib flutter occurring after night of drinking.  He declined cardioversion at that time. A few days after he experienced an episode of Tachycardia. He took his fast acting medication which resolved his symptoms and lowered his blood pressure. He has been monitoring his blood pressure regularly and is complaint with his medication, but his blood pressure has been elevated around 130/90-100's.  The patient reports he has not been feeling well since. He endorses dizziness, SOB, occasional chest pain, pain in his feet, mainly his toes and feeling flushed. He also reports his left arm is tingling.   He has undergone 4 cardioversion's in the past, but adds they were not very successful, since he would only stay in regular rhythm for minutes before going back into Afib. These were prior to ablation   When he had an ablation in the past it helped with his Afib and is interested in doing it again. His liver has been good and reports he has been cutting back on drinking a lot. Non smoker. 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   The patient denies nocturnal dyspnea, orthopnea or peripheral edema.  There has been no  syncope.   He has [employer] and I am no longer in-network for him.  Thromboembolic risk factors (, HTN--1. CHF-1) for a CHADSVASc Score of 2.  Past Medical History:  Diagnosis Date   Alcohol abuse    Atrial fibrillation (HCC)    CHF (congestive heart failure) ([place])    8/18: EF 50-55%, mild LVH, RA/RV- mild dilation   Depression    Hypertension    Tobacco abuse     Past Surgical History:  Procedure Laterality Date   ABLATION     CARDIOVERSION      Current Outpatient Medications  Medication Sig Dispense Refill   diltiazem (CARDIZEM CD) 180 MG 24 hr capsule Take 1 capsule (180 mg total) by mouth daily. 30 capsule 0   diltiazem (CARDIZEM) 30 MG tablet Take 1 tablet (30 mg total) by mouth 2 (two) times daily as needed (Hr greater than 130). 60 tablet 0   ELIQUIS 5 MG TABS tablet TAKE 1 TABLET BY MOUTH TWICE A DAY 180 tablet 1   gabapentin (NEURONTIN) 300 MG capsule Take 1 capsule (300 mg total) by mouth 2 (two) times daily.     lisinopril (PRINIVIL,ZESTRIL) 10 MG tablet TAKE 1 TABLET BY MOUTH EVERY DAY 30 tablet 1   metoprolol succinate (TOPROL-XL) 25 MG 24 hr tablet Take 1 tablet (25 mg total) by mouth 2 (two) times daily.  180 tablet 3   Misc Natural Products (GLUCOSAMINE CHOND COMPLEX/MSM PO) Take by mouth.     tamsulosin (FLOMAX) 0.4 MG CAPS capsule TAKE 1 CAPSULE BY MOUTH EVERYDAY AT BEDTIME 90 capsule 1   tiZANidine (ZANAFLEX) 2 MG tablet Take 2 tablets (4 mg total) by mouth 2 (two) times daily as needed for muscle spasms. 120 tablet 2   No current facility-administered medications for this visit.     No Known Allergies    Review of Systems negative except from HPI and PMH  Physical Exam BP (!) 148/80 (BP Location: Left Arm, Patient Position: Sitting, Cuff Size: Normal)    Pulse (!) 120    Ht 5\' 8"  (1.727 m)    Wt 244 lb 8 oz (110.9 kg)    BMI 37.18 kg/m    Well developed and nourished in no acute distress HENT normal Neck supple with JVP-6 Carotids brisk and full without  bruits Clear Irregularly irregular rate and rhythm with rapid ventricular response, no murmurs or gallops Abd-soft with active BS without hepatomegaly No Clubbing cyanosis edema Skin-warm and clammy A & Oriented  Grossly normal sensory and motor function   ECG atrial flutter-atypical with variable conduction @ 180 -/10/33   Assessment and  Plan  Atrial fibrillation/flutter-persistent  PVI and roof lesions [facility] 2016  Syncope  Cardiomyopathy-presumed nonischemic with interval resolution      On Anticoagulation;  No bleeding issues   No interval syncope  Will schedule Cardioversion for later this week at [facility]--as he missed doses of his Eliquis prior to his epidural, he will need antecedent TEE  Depending on his ability to maintain sinus rhythm would consider the introduction of an antiarrhythmic drug.  Interval normalization of LV function    Has [employer] and is no longer in network.  Will reach out to [facility] as above    We spent more than 50% of our >25* min visit in face to face counseling regarding the above  I, Margit Banda am acting as a scribe for Virl Axe, M.D. I have reviewed the above documentation for accuracy and completeness, and I agree with the above.    Signed, Virl Axe, MD 09/22/18 [place], [place]

## 2018-09-21 NOTE — Telephone Encounter (Signed)
I left a message for the patient that his appointment has been rescheduled to 09/22/18 at 10:15 am with Dr. Caryl Comes if he could call back and confirm.

## 2018-09-22 ENCOUNTER — Ambulatory Visit: Payer: BLUE CROSS/BLUE SHIELD | Admitting: Internal Medicine

## 2018-09-22 ENCOUNTER — Encounter: Payer: Self-pay | Admitting: Internal Medicine

## 2018-09-22 ENCOUNTER — Other Ambulatory Visit: Payer: Self-pay | Admitting: Family Medicine

## 2018-09-22 ENCOUNTER — Ambulatory Visit: Payer: BLUE CROSS/BLUE SHIELD | Admitting: Family Medicine

## 2018-09-22 VITALS — BP 148/80 | HR 120 | Ht 68.0 in | Wt 244.5 lb

## 2018-09-22 DIAGNOSIS — I483 Typical atrial flutter: Secondary | ICD-10-CM

## 2018-09-22 DIAGNOSIS — I480 Paroxysmal atrial fibrillation: Secondary | ICD-10-CM

## 2018-09-22 MED ORDER — DILTIAZEM HCL ER COATED BEADS 180 MG PO CP24: 180.0000 mg | ORAL_CAPSULE | Freq: Two times a day (BID) | ORAL | 0 refills | Status: DC

## 2018-09-22 MED ORDER — LISINOPRIL 10 MG PO TABS: 10.0000 mg | ORAL_TABLET | Freq: Every day | ORAL | 0 refills | Status: DC

## 2018-09-22 NOTE — Patient Instructions (Signed)
Medication Instructions:  - Your physician has recommended you make the following change in your medication:   1) INCREASE cardizem (diltiazem) 180 mg- take 1 capsule twice daily  If you need a refill on your cardiac medications before your next appointment, please call your pharmacy.   Lab work: - none ordered  If you have labs (blood work) drawn today and your tests are completely normal, you will receive your results only by: Marland Kitchen MyChart Message (if you have MyChart) OR . A paper copy in the mail If you have any lab test that is abnormal or we need to change your treatment, we will call you to review the results.  Testing/Procedures: - none ordered  Follow-Up: At [facility], you and your health needs are our priority.  As part of our continuing mission to provide you with exceptional heart care, we have created designated Provider Care Teams.  These Care Teams include your primary Cardiologist (physician) and Advanced Practice Providers (APPs -  Physician Assistants and Nurse Practitioners) who all work together to provide you with the care you need, when you need it. . we will be in touch with you about arranging follow up at [facility] for ongoing care due to your insurance  Any Other Special Instructions Will Be Listed Below (If Applicable). - N/A

## 2018-10-08 ENCOUNTER — Ambulatory Visit: Payer: BLUE CROSS/BLUE SHIELD | Admitting: Internal Medicine

## 2018-10-09 ENCOUNTER — Telehealth: Payer: Self-pay

## 2018-10-09 NOTE — Telephone Encounter (Signed)
Nurses, please call for Post procedure evaluation. 09-14-2018 Left LESI

## 2018-10-12 ENCOUNTER — Ambulatory Visit: Payer: BLUE CROSS/BLUE SHIELD | Admitting: Student in an Organized Health Care Education/Training Program

## 2018-10-12 ENCOUNTER — Telehealth: Payer: Self-pay | Admitting: *Deleted

## 2018-10-12 NOTE — Telephone Encounter (Signed)
Voicemail left with patient that we were calling for post procedure evaluation

## 2018-11-04 ENCOUNTER — Other Ambulatory Visit: Payer: Self-pay | Admitting: Family Medicine

## 2018-11-05 ENCOUNTER — Telehealth: Payer: Self-pay | Admitting: Cardiovascular Disease

## 2018-11-05 NOTE — Telephone Encounter (Signed)
Patient states he needs EP only and is now in the [employer].  Declined appt .  Per request Deleting recall.

## 2018-11-16 ENCOUNTER — Other Ambulatory Visit: Payer: Self-pay | Admitting: Internal Medicine

## 2018-12-07 ENCOUNTER — Telehealth: Payer: Self-pay | Admitting: Family Medicine

## 2018-12-07 NOTE — Telephone Encounter (Signed)
Copied from [place] [id number]. Topic: Quick Communication - Rx Refill/Question >> Dec 07, 2018  2:44 PM Percell Belt A wrote: Medication: tamsulosin (FLOMAX) 0.4 MG CAPS capsule [[id number]]-  pt is requesting 90 day supply   Has the patient contacted their pharmacy? No. (Agent: If no, request that the patient contact the pharmacy for the refill.) (Agent: If yes, when and what did the pharmacy advise?)  Preferred Pharmacy (with phone number or street name): [facility] [address]   Agent: Please be advised that RX refills may take up to 3 business days. We ask that you follow-up with your pharmacy.

## 2018-12-10 MED ORDER — TAMSULOSIN HCL 0.4 MG PO CAPS: ORAL_CAPSULE | ORAL | 0 refills | Status: DC

## 2018-12-10 NOTE — Telephone Encounter (Signed)
Last OV with PCP 3/19 has seen NP in office 1/20

## 2018-12-10 NOTE — Telephone Encounter (Signed)
Appointment scheduled.

## 2018-12-10 NOTE — Telephone Encounter (Signed)
I will refill this though he does need a follow-up visit in the office.  Thanks.

## 2018-12-11 NOTE — Progress Notes (Signed)
Tried to reach patient no answer left message to call office. 

## 2018-12-14 ENCOUNTER — Ambulatory Visit: Payer: BLUE CROSS/BLUE SHIELD | Admitting: Family Medicine

## 2018-12-14 ENCOUNTER — Other Ambulatory Visit: Payer: Self-pay

## 2018-12-14 ENCOUNTER — Telehealth: Payer: Self-pay | Admitting: Family Medicine

## 2018-12-14 NOTE — Progress Notes (Deleted)
Virtual Visit via Telephone Note  This visit type was conducted due to national recommendations for restrictions regarding the COVID-19 pandemic (e.g. social distancing).  This format is felt to be most appropriate for this patient at this time.  All issues noted in this document were discussed and addressed.  No physical exam was performed (except for noted visual exam findings with Video Visits).   I connected with Derrick Murphy today at  9:30 AM EDT by telephone and verified that I am speaking with the correct person using two identifiers. Location patient: home Location provider: home office Persons participating in the virtual visit: patient, provider  I discussed the limitations, risks, security and privacy concerns of performing an evaluation and management service by telephone and the availability of in person appointments. I also discussed with the patient that there may be a patient responsible charge related to this service. The patient expressed understanding and agreed to proceed.  Interactive audio and video telecommunications were attempted between this provider and patient, however failed, due to patient having technical difficulties OR patient did not have access to video capability.  We continued and completed visit with audio only. ***  Reason for visit: ***  HPI: BPH: Strain- *** Flow- *** Frequency- *** Urgency- *** Nocturia- *** Dysuria- *** Emptying bladder- *** Medication- ***    ROS: See pertinent positives and negatives per HPI.  Past Medical History:  Diagnosis Date  . Alcohol abuse   . Atrial fibrillation ([place])   . CHF (congestive heart failure) ([place])    8/18: EF 50-55%, mild LVH, RA/RV- mild dilation  . Depression   . Hypertension   . Tobacco abuse     Past Surgical History:  Procedure Laterality Date  . ABLATION    . CARDIOVERSION      Family History  Problem Relation Age of Onset  . Heart disease Mother   . Heart disease Father    . Heart disease Paternal Uncle     SOCIAL HX: ***   Current Outpatient Medications:  .  diltiazem (CARDIZEM CD) 180 MG 24 hr capsule, Take 1 capsule (180 mg total) by mouth 2 (two) times daily., Disp: 180 capsule, Rfl: 0 .  diltiazem (CARDIZEM) 30 MG tablet, Take 1 tablet (30 mg total) by mouth 2 (two) times daily as needed (Hr greater than 130)., Disp: 60 tablet, Rfl: 0 .  ELIQUIS 5 MG TABS tablet, TAKE 1 TABLET BY MOUTH TWICE A DAY, Disp: 180 tablet, Rfl: 1 .  gabapentin (NEURONTIN) 300 MG capsule, Take 1 capsule (300 mg total) by mouth 2 (two) times daily., Disp: , Rfl:  .  lisinopril (ZESTRIL) 10 MG tablet, TAKE 1 TABLET BY MOUTH EVERY DAY, Disp: 90 tablet, Rfl: 3 .  metoprolol succinate (TOPROL-XL) 25 MG 24 hr tablet, Take 1 tablet (25 mg total) by mouth 2 (two) times daily., Disp: 180 tablet, Rfl: 3 .  Misc Natural Products (GLUCOSAMINE CHOND COMPLEX/MSM PO), Take by mouth., Disp: , Rfl:  .  tamsulosin (FLOMAX) 0.4 MG CAPS capsule, TAKE 1 CAPSULE BY MOUTH EVERYDAY AT BEDTIME, Disp: 90 capsule, Rfl: 0  EXAM:  VITALS per patient if applicable:  GENERAL: alert, oriented, appears well and in no acute distress  HEENT: atraumatic, conjunttiva clear, no obvious abnormalities on inspection of external nose and ears  NECK: normal movements of the head and neck  LUNGS: on inspection no signs of respiratory distress, breathing rate appears normal, no obvious gross SOB, gasping or wheezing  CV: no obvious cyanosis  MS: moves all visible extremities without noticeable abnormality  PSYCH/NEURO: pleasant and cooperative, no obvious depression or anxiety, speech and thought processing grossly intact  ASSESSMENT AND PLAN:  Discussed the following assessment and plan:  No diagnosis found.  No problem-specific Assessment & Plan notes found for this encounter.    I discussed the assessment and treatment plan with the patient. The patient was provided an opportunity to ask questions and  all were answered. The patient agreed with the plan and demonstrated an understanding of the instructions.   The patient was advised to call back or seek an in-person evaluation if the symptoms worsen or if the condition fails to improve as anticipated.  I provided *** minutes of non-face-to-face time during this encounter.   Tommi Rumps, MD

## 2018-12-14 NOTE — Telephone Encounter (Signed)
Please call the patient and get his new insurance information. He thinks we are likely out of network but I want to make sure as he does need a visit. Please also see if there are any resources we can provide to him to help him navigate the insurance aspect.

## 2018-12-14 NOTE — Progress Notes (Signed)
I contacted the patient for his visit.  He noted he had new insurance and thinks that our office may not be in network and that he cannot afford to pay and out-of-pocket cost for the visit.  We did not proceed with the visit and a phone message was sent to Kerin Salen and Bud Face to help determine if he is in or out of network.  We will attempt to get him scheduled if his insurance does not cover our office.  If it does not cover our office I encouraged him to establish care with somebody in network or to try to get his insurance information change so we can continue to see cost.

## 2018-12-15 NOTE — Telephone Encounter (Signed)
Noted. Thanks for letting me know. Could you have someone contact the patient to let him know this? Thanks.

## 2018-12-15 NOTE — Telephone Encounter (Signed)
The patient is out of network he will need to see a provider at [facility] or be billed at out of [employer].

## 2018-12-16 NOTE — Telephone Encounter (Signed)
I left the patient a message waiting on a return call.

## 2019-01-04 ENCOUNTER — Other Ambulatory Visit: Payer: Self-pay

## 2019-01-04 ENCOUNTER — Telehealth: Payer: Self-pay | Admitting: Internal Medicine

## 2019-01-04 MED ORDER — DILTIAZEM HCL ER COATED BEADS 180 MG PO CP24: 180.0000 mg | ORAL_CAPSULE | Freq: Two times a day (BID) | ORAL | 0 refills | Status: DC

## 2019-01-04 NOTE — Telephone Encounter (Signed)
diltiazem (CARDIZEM CD) 180 MG 24 hr capsule 180 capsule 0 01/04/2019 01/04/2020   Sig - Route: Take 1 capsule (180 mg total) by mouth 2 (two) times daily. - Oral   Sent to pharmacy as: diltiazem (CARDIZEM CD) 180 MG 24 hr capsule   Notes to Pharmacy: Dose change   E-Prescribing Status: Receipt confirmed by pharmacy (01/04/2019 9:15 AM EDT)

## 2019-01-04 NOTE — Telephone Encounter (Signed)
°*  STAT* If patient is at the pharmacy, call can be transferred to refill team.   1. Which medications need to be refilled? (please list name of each medication and dose if known) diltiazem (CARDIZEM) 180 MG - 1 capsule 2 times daily   2. Which pharmacy/location (including street and city if local pharmacy) [facility] [address]  3. Do they need a 30 day or 90 day supply? 90 day

## 2019-02-06 ENCOUNTER — Other Ambulatory Visit: Payer: Self-pay | Admitting: Student in an Organized Health Care Education/Training Program

## 2019-03-08 ENCOUNTER — Other Ambulatory Visit: Payer: Self-pay | Admitting: Family Medicine

## 2019-03-08 NOTE — Telephone Encounter (Signed)
Medication refill: tamsulosin (FLOMAX) 0.4 MG CAPS capsule [[id number]]   diltiazem (CARDIZEM CD) 180 MG 24 hr capsule [[id number]]   metoprolol succinate (TOPROL-XL) 25 MG 24 hr tablet [[id number]]     [facility]  [id number] [place], [place] [phone] (Phone) [phone] (Fax)

## 2019-03-08 NOTE — Telephone Encounter (Signed)
Please advise if Dr. Caryl Bis wants to refill these meds from Dr. Caryl Comes

## 2019-03-09 ENCOUNTER — Other Ambulatory Visit: Payer: Self-pay

## 2019-03-09 MED ORDER — METOPROLOL SUCCINATE ER 25 MG PO TB24: 25.0000 mg | ORAL_TABLET | Freq: Two times a day (BID) | ORAL | 0 refills | Status: DC

## 2019-03-09 MED ORDER — DILTIAZEM HCL ER COATED BEADS 180 MG PO CP24: 180.0000 mg | ORAL_CAPSULE | Freq: Two times a day (BID) | ORAL | 0 refills | Status: DC

## 2019-04-06 ENCOUNTER — Other Ambulatory Visit: Payer: Self-pay | Admitting: Family Medicine

## 2019-04-07 ENCOUNTER — Telehealth: Payer: Self-pay

## 2019-04-07 ENCOUNTER — Other Ambulatory Visit: Payer: Self-pay | Admitting: Family Medicine

## 2019-04-07 NOTE — Telephone Encounter (Signed)
Close  

## 2019-04-07 NOTE — Telephone Encounter (Signed)
A refill request for Flomax came into office, patient was called and stated he no longer can see Dr. Caryl Bis because his insurance was for [facility] and he is looking fo another primary care provider, I spoke with the provider and he stated to not refill the medication that the patient is no longer under his care, medication was refused and sent o pharmacy stating patient is no longer under this provider care.  Nina,cma

## 2019-04-13 ENCOUNTER — Other Ambulatory Visit: Payer: Self-pay | Admitting: Family Medicine

## 2019-04-13 ENCOUNTER — Telehealth: Payer: Self-pay | Admitting: *Deleted

## 2019-04-13 ENCOUNTER — Telehealth: Payer: Self-pay | Admitting: Family Medicine

## 2019-04-13 NOTE — Telephone Encounter (Signed)
Error

## 2019-04-13 NOTE — Telephone Encounter (Signed)
Copied from [place] [id number]. Topic: General - Call Back - No Documentation >> Apr 13, 2019 11:42 AM Erick Blinks wrote: Reason for CRM: Call back request from management, has insurance questions  Best contact: [phone]

## 2019-04-13 NOTE — Telephone Encounter (Signed)
I don't know what type of insurance questions. I will call the patient to see and redirect if needed.

## 2019-04-14 ENCOUNTER — Telehealth: Payer: Self-pay | Admitting: Family Medicine

## 2019-04-14 ENCOUNTER — Other Ambulatory Visit: Payer: Self-pay | Admitting: Family Medicine

## 2019-04-14 MED ORDER — DILTIAZEM HCL ER COATED BEADS 180 MG PO CP24: 180.0000 mg | ORAL_CAPSULE | Freq: Two times a day (BID) | ORAL | 0 refills | Status: DC

## 2019-04-14 MED ORDER — METOPROLOL SUCCINATE ER 25 MG PO TB24: 25.0000 mg | ORAL_TABLET | Freq: Two times a day (BID) | ORAL | 0 refills | Status: DC

## 2019-04-14 MED ORDER — LISINOPRIL 10 MG PO TABS: 10.0000 mg | ORAL_TABLET | Freq: Every day | ORAL | 0 refills | Status: DC

## 2019-04-14 NOTE — Telephone Encounter (Signed)
He has not been seen by [place] for a follow-up visit in over a year. He has only been seen for acute visits, though no follow-up and he has not had any lab work in the past 6 months. I can refill for 30 days, though he needs to follow-up with [place] for these medications to be refilled in the future. If he needs a sooner appointment than his scheduled one with [place] we could try to move his appointment up with me or have him see Lauren. His insurance does not cover our office so he would be responsible for any payment if he were to come to our office for a visit. I unfortunately have no way of knowing who takes his insurance and thus a referral would not be effective. It would be best if he looked at the physicians listed by his insurance himself to call to set up an appointment.

## 2019-04-14 NOTE — Telephone Encounter (Signed)
Pt called and is requesting to have these medications refilled. Pt states he still has [employer] and he has not found anyone who could take him in quickly to establish care. Pt is requesting to be referred to someone who can see him sooner. Please advise.   Metoprolol, lisinopril, cardizem   [id number] [address], [place] [address]  Phone: [phone] Fax: [phone]  Not a 24 hour pharmacy; exact hours not known.

## 2019-04-15 NOTE — Telephone Encounter (Signed)
I called and explained to the pt that he needs to be seen and that he can only get a 30 day supply of medications.  I did offer him an earlier visit with you and he stated he was gonna try again to find a physician that will take his insurance and if he has no luck he will call back.  Nina,cma

## 2019-04-16 DIAGNOSIS — E781 Pure hyperglyceridemia: Secondary | ICD-10-CM | POA: Insufficient documentation

## 2019-04-21 ENCOUNTER — Ambulatory Visit: Payer: BLUE CROSS/BLUE SHIELD | Admitting: Family Medicine

## 2019-04-26 ENCOUNTER — Ambulatory Visit: Payer: BLUE CROSS/BLUE SHIELD | Admitting: Family Medicine

## 2019-04-27 ENCOUNTER — Ambulatory Visit: Payer: BLUE CROSS/BLUE SHIELD | Admitting: Family Medicine

## 2019-05-07 ENCOUNTER — Other Ambulatory Visit: Payer: Self-pay | Admitting: Family Medicine

## 2019-06-21 ENCOUNTER — Other Ambulatory Visit: Payer: Self-pay | Admitting: Family Medicine

## 2019-07-09 ENCOUNTER — Ambulatory Visit: Payer: BLUE CROSS/BLUE SHIELD

## 2019-07-21 ENCOUNTER — Other Ambulatory Visit: Payer: BLUE CROSS/BLUE SHIELD

## 2019-08-20 ENCOUNTER — Ambulatory Visit: Payer: BLUE CROSS/BLUE SHIELD

## 2019-08-28 ENCOUNTER — Ambulatory Visit: Payer: BLUE CROSS/BLUE SHIELD | Attending: Internal Medicine

## 2019-08-28 DIAGNOSIS — Z23 Encounter for immunization: Secondary | ICD-10-CM | POA: Insufficient documentation

## 2019-08-28 NOTE — Progress Notes (Signed)
   Covid-19 Vaccination Clinic  Name:  Derrick Murphy    MRN: [id number] DOB: 10/20/1951  08/28/2019  [name] was observed post Covid-19 immunization for 15 minutes without incidence. He was provided with Vaccine Information Sheet and instruction to access the V-Safe system.   [name] was instructed to call 911 with any severe reactions post vaccine: Marland Kitchen Difficulty breathing  . Swelling of your face and throat  . A fast heartbeat  . A bad rash all over your body  . Dizziness and weakness    Immunizations Administered    Name Date Dose VIS Date Route   Pfizer COVID-19 Vaccine 08/28/2019  8:15 AM 0.3 mL 07/02/2019 Intramuscular   Manufacturer: [place]   Lot: [id number]   [place]: [id number]

## 2019-08-31 ENCOUNTER — Ambulatory Visit: Payer: BLUE CROSS/BLUE SHIELD

## 2019-09-21 ENCOUNTER — Ambulatory Visit: Payer: BLUE CROSS/BLUE SHIELD | Attending: Internal Medicine

## 2019-09-21 DIAGNOSIS — Z23 Encounter for immunization: Secondary | ICD-10-CM | POA: Insufficient documentation

## 2019-09-21 NOTE — Progress Notes (Signed)
   Covid-19 Vaccination Clinic  Name:  Rayen Siri    MRN: [id number] DOB: 10/20/1951  09/21/2019  Mr. Sutherland was observed post Covid-19 immunization for 15 minutes without incident. He was provided with Vaccine Information Sheet and instruction to access the V-Safe system.   Mr. Sylte was instructed to call 911 with any severe reactions post vaccine: Marland Kitchen Difficulty breathing  . Swelling of face and throat  . A fast heartbeat  . A bad rash all over body  . Dizziness and weakness   Immunizations Administered    Name Date Dose VIS Date Route   Pfizer COVID-19 Vaccine 09/21/2019  8:28 AM 0.3 mL 07/02/2019 Intramuscular   Manufacturer: [place]   Lot: [id number]   [place]: [id number]

## 2020-02-17 DIAGNOSIS — D3162 Benign neoplasm of unspecified site of left orbit: Secondary | ICD-10-CM | POA: Insufficient documentation

## 2020-07-10 ENCOUNTER — Other Ambulatory Visit: Payer: Self-pay | Admitting: Gastroenterology

## 2020-07-10 DIAGNOSIS — K7030 Alcoholic cirrhosis of liver without ascites: Secondary | ICD-10-CM

## 2020-07-10 DIAGNOSIS — R1011 Right upper quadrant pain: Secondary | ICD-10-CM

## 2020-07-18 ENCOUNTER — Ambulatory Visit: Admission: RE | Admit: 2020-07-18 | Payer: BLUE CROSS/BLUE SHIELD | Source: Ambulatory Visit

## 2020-07-28 ENCOUNTER — Encounter: Payer: Self-pay | Admitting: Internal Medicine

## 2020-08-22 ENCOUNTER — Other Ambulatory Visit
Admission: RE | Admit: 2020-08-22 | Discharge: 2020-08-22 | Disposition: A | Discharge status: Home / Self Care | Payer: BLUE CROSS/BLUE SHIELD | Source: Ambulatory Visit | Attending: Infectious Diseases | Admitting: Infectious Diseases

## 2020-08-22 DIAGNOSIS — R079 Chest pain, unspecified: Secondary | ICD-10-CM | POA: Diagnosis not present

## 2020-08-22 DIAGNOSIS — I4819 Other persistent atrial fibrillation: Secondary | ICD-10-CM | POA: Insufficient documentation

## 2020-08-22 DIAGNOSIS — I10 Essential (primary) hypertension: Secondary | ICD-10-CM | POA: Insufficient documentation

## 2020-08-22 LAB — TROPONIN I (HIGH SENSITIVITY): Troponin I (High Sensitivity): 4 ng/L (ref <18)

## 2021-06-22 ENCOUNTER — Other Ambulatory Visit: Payer: Self-pay

## 2021-06-22 ENCOUNTER — Ambulatory Visit: Payer: BLUE CROSS/BLUE SHIELD | Attending: Internal Medicine

## 2021-06-22 DIAGNOSIS — Z23 Encounter for immunization: Secondary | ICD-10-CM

## 2021-06-22 MED ORDER — PFIZER COVID-19 VAC BIVALENT 30 MCG/0.3ML IM SUSP
INTRAMUSCULAR | 0 refills | Status: DC
  Filled 2021-06-22: qty 0.3, 1d supply, fill #0

## 2021-06-22 NOTE — Progress Notes (Signed)
   Covid-19 Vaccination Clinic  Name:  Derrick Murphy    MRN: [id number] DOB: [birth year]  06/22/2021  [name] was observed post Covid-19 immunization for 15 minutes without incident. He was provided with Vaccine Information Sheet and instruction to access the V-Safe system.   [name] was instructed to call 911 with any severe reactions post vaccine: Difficulty breathing  Swelling of face and throat  A fast heartbeat  A bad rash all over body  Dizziness and weakness   Immunizations Administered     Name Date Dose VIS Date Route   Pfizer Covid-19 Vaccine Bivalent Booster 06/22/2021  1:55 PM 0.3 mL 03/21/2021 Intramuscular   Manufacturer: [place]   Lot: [id number]   [place]: [place], PharmD, MBA Clinical Acute Care Pharmacist

## 2021-08-10 ENCOUNTER — Other Ambulatory Visit: Payer: Self-pay

## 2021-08-10 ENCOUNTER — Other Ambulatory Visit: Payer: Self-pay | Admitting: Podiatry

## 2021-08-10 ENCOUNTER — Ambulatory Visit: Payer: 59 | Admitting: Podiatry

## 2021-08-10 ENCOUNTER — Encounter: Payer: Self-pay | Admitting: Podiatry

## 2021-08-10 ENCOUNTER — Ambulatory Visit (INDEPENDENT_AMBULATORY_CARE_PROVIDER_SITE_OTHER): Payer: 59

## 2021-08-10 DIAGNOSIS — M205X2 Other deformities of toe(s) (acquired), left foot: Secondary | ICD-10-CM

## 2021-08-10 DIAGNOSIS — M7752 Other enthesopathy of left foot: Secondary | ICD-10-CM

## 2021-08-10 DIAGNOSIS — M10072 Idiopathic gout, left ankle and foot: Secondary | ICD-10-CM

## 2021-08-10 DIAGNOSIS — G4700 Insomnia, unspecified: Secondary | ICD-10-CM | POA: Insufficient documentation

## 2021-08-10 DIAGNOSIS — M5442 Lumbago with sciatica, left side: Secondary | ICD-10-CM | POA: Insufficient documentation

## 2021-08-10 DIAGNOSIS — M109 Gout, unspecified: Secondary | ICD-10-CM

## 2021-08-10 MED ORDER — MELOXICAM 15 MG PO TABS: 15.0000 mg | ORAL_TABLET | Freq: Every day | ORAL | 1 refills | Status: DC

## 2021-08-10 MED ORDER — BETAMETHASONE SOD PHOS & ACET 6 (3-3) MG/ML IJ SUSP
3.0000 mg | Freq: Once | INTRAMUSCULAR | Status: AC
  Administered 2021-08-10: 3 mg via INTRA_ARTICULAR

## 2021-08-10 MED ORDER — METHYLPREDNISOLONE 4 MG PO TBPK: ORAL_TABLET | ORAL | 0 refills | Status: DC

## 2021-08-10 NOTE — Progress Notes (Signed)
° °  HPI: 56 y.o. male presenting today as a new patient for evaluation of pain and tenderness associated to the left great toe.  Patient states has been very painful for about 2 weeks now.  Patient said it was a sudden onset where he began to get redness and swelling with some pain to the great toe joint.  He says that his mother had a history of gout.  He denies a history of injury.  He has not done anything for treatment at the moment.  He presents for further treatment and evaluation  Past Medical History:  Diagnosis Date   Alcohol abuse    Atrial fibrillation (HCC)    CHF (congestive heart failure) ([place])    8/18: EF 50-55%, mild LVH, RA/RV- mild dilation   Depression    Hypertension    Tobacco abuse     Past Surgical History:  Procedure Laterality Date   ABLATION     CARDIOVERSION      No Known Allergies   Physical Exam: General: The patient is alert and oriented x3 in no acute distress.  Dermatology: Skin is warm, dry and supple bilateral lower extremities. Negative for open lesions or macerations.  Vascular: Palpable pedal pulses bilaterally. Capillary refill within normal limits.  There is some erythema localized around the great toe joint of the left foot  Neurological: Light touch and protective threshold grossly intact  Musculoskeletal Exam: No pedal deformities noted  Radiographic Exam:  Normal osseous mineralization. Joint spaces preserved. No fracture/dislocation/boney destruction.    Assessment: 1.  Suspect acute gout first MTP joint left   Plan of Care:  1. Patient evaluated. X-Rays reviewed.  2.  Injection of 0.5 cc Celestone Soluspan injected in the first MTP joint left 3.  Prescription for Medrol Dosepak 4.  Prescription for meloxicam 15 mg daily.  Patient understands this will be a very short course since he is on anticoagulant Eliquis 5 mg tablets 5.  The patient states that he has been previously diagnosed with rheumatoid arthritis or different forms  of arthritis throughout the past.  He says he has not had any testing done to confirm any sort of arthritis. 6.  Arthritic panel was ordered today, which would include uric acid levels to confirm any findings of gout 7.  Return to clinic in 4 weeks  *[facility]      Edrick Kins, DPM [facility]  Dr. Edrick Kins, DPM    [address],  [address]                Office [phone]  Fax [phone]

## 2021-08-11 LAB — URIC ACID: Uric Acid: 5.5 mg/dL (ref 3.8–8.4)

## 2021-08-11 LAB — RHEUMATOID FACTOR: Rheumatoid fact SerPl-aCnc: <10 IU/mL (ref <14.0)

## 2021-08-11 LAB — SEDIMENTATION RATE: Sed Rate: 4 mm/hr (ref 0–30)

## 2021-08-11 LAB — ANA: Anti Nuclear Antibody (ANA): NEGATIVE

## 2021-08-14 ENCOUNTER — Emergency Department: Payer: 59

## 2021-08-14 ENCOUNTER — Other Ambulatory Visit: Payer: Self-pay

## 2021-08-14 ENCOUNTER — Emergency Department
Admission: EM | Admit: 2021-08-14 | Discharge: 2021-08-14 | Disposition: A | Discharge status: Home / Self Care | Payer: 59 | Attending: Student in an Organized Health Care Education/Training Program | Admitting: Student in an Organized Health Care Education/Training Program

## 2021-08-14 DIAGNOSIS — I5022 Chronic systolic (congestive) heart failure: Secondary | ICD-10-CM | POA: Diagnosis not present

## 2021-08-14 DIAGNOSIS — R202 Paresthesia of skin: Secondary | ICD-10-CM

## 2021-08-14 LAB — CBC
HCT: 42.7 % (ref 39.0–52.0)
Hemoglobin: 14.4 g/dL (ref 13.0–17.0)
MCH: 31.3 pg (ref 26.0–34.0)
MCHC: 33.7 g/dL (ref 30.0–36.0)
MCV: 92.8 fL (ref 80.0–100.0)
Platelets: 297 10*3/uL (ref 150–400)
RBC: 4.6 MIL/uL (ref 4.22–5.81)
RDW: 15.1 % (ref 11.5–15.5)
WBC: 15.7 10*3/uL — ABNORMAL HIGH (ref 4.0–10.5)
nRBC: 0 % (ref 0.0–0.2)

## 2021-08-14 LAB — COMPREHENSIVE METABOLIC PANEL
ALT: 23 U/L (ref 0–44)
AST: 20 U/L (ref 15–41)
Albumin: 4.8 g/dL (ref 3.5–5.0)
Alkaline Phosphatase: 50 U/L (ref 38–126)
Anion gap: 12 (ref 5–15)
BUN: 27 mg/dL — ABNORMAL HIGH (ref 6–20)
CO2: 20 mmol/L — ABNORMAL LOW (ref 22–32)
Calcium: 9 mg/dL (ref 8.9–10.3)
Chloride: 97 mmol/L — ABNORMAL LOW (ref 98–111)
Creatinine, Ser: 1.19 mg/dL (ref 0.61–1.24)
GFR, Estimated: >60 mL/min (ref >60)
Glucose, Bld: 128 mg/dL — ABNORMAL HIGH (ref 70–99)
Potassium: 3.8 mmol/L (ref 3.5–5.1)
Sodium: 129 mmol/L — ABNORMAL LOW (ref 135–145)
Total Bilirubin: 0.7 mg/dL (ref 0.3–1.2)
Total Protein: 7.7 g/dL (ref 6.5–8.1)

## 2021-08-14 LAB — DIFFERENTIAL
Abs Immature Granulocytes: 0.16 10*3/uL — ABNORMAL HIGH (ref 0.00–0.07)
Basophils Absolute: 0 10*3/uL (ref 0.0–0.1)
Basophils Relative: 0 %
Eosinophils Absolute: 0 10*3/uL (ref 0.0–0.5)
Eosinophils Relative: 0 %
Immature Granulocytes: 1 %
Lymphocytes Relative: 10 %
Lymphs Abs: 1.5 10*3/uL (ref 0.7–4.0)
Monocytes Absolute: 0.7 10*3/uL (ref 0.1–1.0)
Monocytes Relative: 5 %
Neutro Abs: 13.3 10*3/uL — ABNORMAL HIGH (ref 1.7–7.7)
Neutrophils Relative %: 84 %

## 2021-08-14 LAB — TROPONIN I (HIGH SENSITIVITY)
Troponin I (High Sensitivity): 5 ng/L (ref <18)
Troponin I (High Sensitivity): <2 ng/L (ref <18)

## 2021-08-14 LAB — CBG MONITORING, ED: Glucose-Capillary: 138 mg/dL — ABNORMAL HIGH (ref 70–99)

## 2021-08-14 LAB — APTT: aPTT: 24 seconds (ref 24–36)

## 2021-08-14 LAB — PROTIME-INR
INR: 1 (ref 0.8–1.2)
Prothrombin Time: 12.9 seconds (ref 11.4–15.2)

## 2021-08-14 MED ORDER — DIAZEPAM 5 MG PO TABS
5.0000 mg | ORAL_TABLET | Freq: Once | ORAL | Status: AC
  Administered 2021-08-14: 14:00:00 5 mg via ORAL
  Filled 2021-08-14: qty 1

## 2021-08-14 MED ORDER — SODIUM CHLORIDE 0.9% FLUSH: 3.0000 mL | Freq: Once | INTRAVENOUS | Status: DC

## 2021-08-14 NOTE — ED Notes (Signed)
Pt away at imaging.

## 2021-08-14 NOTE — ED Notes (Signed)
Pt screened by MRI.

## 2021-08-14 NOTE — ED Notes (Signed)
Pt sitting calmly on stretcher. Pt walked steadily to and from bathroom. Pt using his personal phone.

## 2021-08-14 NOTE — ED Provider Triage Note (Signed)
Emergency Medicine Provider Triage Evaluation Note  Derrick Murphy, a 56 y.o. male  was evaluated in triage.  Pt complains of LUE numbness as well as left facial numbness. Patient would endorse symptoms onset 1 hour prior to arrival. He denies any facial droop, slurred speech, or paralysis. He is able to text on his cell phone without difficulty    Review of Systems  Positive: LUE/facial numbness Negative: Weakness, paralysis, slurred speech  Physical Exam  BP (!) 164/69 (BP Location: Right Arm)    Pulse 99    Temp 97.8 F (36.6 C) (Oral)    Resp 20    Ht 5\' 8"  (1.727 m)    Wt 95.3 kg    SpO2 98%    BMI 31.93 kg/m  Gen:   Awake, no distress   Resp:  Normal effort  MSK:   Moves extremities without difficulty  Other:  CVS: RRR  Medical Decision Making  Medically screening exam initiated at 11:45 AM.  Appropriate orders placed.  Derrick Murphy was informed that the remainder of the evaluation will be completed by another provider, this initial triage assessment does not replace that evaluation, and the importance of remaining in the ED until their evaluation is complete.   Patient with ED evaluation of left facial and LUE paresthesias with onset 1 hour prior to arrival.    Derrick Needles, PA-C 08/14/21 1148

## 2021-08-14 NOTE — ED Triage Notes (Signed)
Pt continues to pace around waiting room asking for a stretcher and for "Fucking Help" Explained all triage rooms are full he is next to be triaged.

## 2021-08-14 NOTE — ED Triage Notes (Addendum)
Pt to ED via POV from home. Pt c/o numbness in left side of face, arm and leg that started 1h PTA. Pt state hx pinched nerve in left shoulder.  Pt also reports SOB. Pt denies blood thinners. Pt hx HTN and CHF. Pt grips equal, face symmetric,clear speech.

## 2021-08-14 NOTE — ED Notes (Signed)
PT d/c without signing for d/c paperwork as topaz froze and printer wouldn't receive request to print so pt could sign. Pt educated.

## 2021-08-14 NOTE — ED Notes (Signed)
See triage note. Pt reports numbness in L jaw, shoulder, and leg starting this morning around 10am while at work. Denies CP or HA. Denies speech changes. Stated was having slight issue with balance this morning. Pt's speech currently clear, balance intact, coordination intact.

## 2021-08-14 NOTE — ED Notes (Signed)
Pt back from imaging but immediately up to restroom. Will collect trop once pt back to bed.

## 2021-08-14 NOTE — ED Notes (Signed)
Per PA no code stroke

## 2021-08-14 NOTE — ED Triage Notes (Signed)
First nurse Note: pt ambulatory without difficulty. Pt states that his left arm is numb has had a rotator injury to it previously but does not feel the same as that. He is able to move his arm without difficulty. No other neuro symptoms denies chest pain. Pt keeps coming up to desk asking if it will long because his arm is hurting. ROM WNL

## 2021-08-14 NOTE — ED Provider Notes (Signed)
[facility] Provider Note    Event Date/Time   First MD Initiated Contact with Patient 08/14/21 1311     (approximate)   History   Numbness   HPI  Derrick Murphy is a 56 y.o. male with a history of alcohol abuse, reported cirrhosis, chronic systolic heart failure, degenerative disc disease and spinal stenosis with a history of paresthesias left upper extremity come to the ER for evaluation of worsening paresthesias in his left arm left face and leg.  States that he started feeling worsening numbness in his leg and face around 10:00 today.  States he is also having new numbness in his right hand.  States that he feels very anxious.  Felt like he had a panic attack last week as well.  Denies any history of stroke.  Denies any headache.     Physical Exam   Triage Vital Signs: ED Triage Vitals  Enc Vitals Group     BP 08/14/21 1132 (!) 164/69     Pulse Rate 08/14/21 1132 99     Resp 08/14/21 1142 20     Temp 08/14/21 1132 97.8 F (36.6 C)     Temp Source 08/14/21 1132 Oral     SpO2 08/14/21 1132 98 %     Weight 08/14/21 1139 210 lb (95.3 kg)     Height 08/14/21 1139 5\' 8"  (1.727 m)     Head Circumference --      Peak Flow --      Pain Score 08/14/21 1139 0     Pain Loc --      Pain Edu? --      Excl. in [place]? --     Most recent vital signs: Vitals:   08/14/21 1142 08/14/21 1352  BP:  (!) 147/78  Pulse:  78  Resp: 20 17  Temp:    SpO2:  99%     Constitutional: Alert  Eyes: Conjunctivae are normal.  Head: Atraumatic. Nose: No congestion/rhinnorhea. Mouth/Throat: Mucous membranes are moist.   Neck: Painless ROM.  Cardiovascular:   Good peripheral circulation. Respiratory: Normal respiratory effort.  No retractions.  Gastrointestinal: Soft and nontender.  Musculoskeletal:  no deformity Neurologic: CN- intact.  No facial droop, Normal FNF.  Normal heel to shin.  He describes sensation of heaviness in his left arm but objective sensation  intact bilaterally. Normal speech and language. No gross focal neurologic deficits are appreciated. No gait instability.  Skin:  Skin is warm, dry and intact. No rash noted. Psychiatric: Mood and affect are normal. Speech and behavior are normal.    ED Results / Procedures / Treatments   Labs (all labs ordered are listed, but only abnormal results are displayed) Labs Reviewed  CBC - Abnormal; Notable for the following components:      Result Value   WBC 15.7 (*)    All other components within normal limits  DIFFERENTIAL - Abnormal; Notable for the following components:   Neutro Abs 13.3 (*)    Abs Immature Granulocytes 0.16 (*)    All other components within normal limits  COMPREHENSIVE METABOLIC PANEL - Abnormal; Notable for the following components:   Sodium 129 (*)    Chloride 97 (*)    CO2 20 (*)    Glucose, Bld 128 (*)    BUN 27 (*)    All other components within normal limits  CBG MONITORING, ED - Abnormal; Notable for the following components:   Glucose-Capillary 138 (*)    All other  components within normal limits  PROTIME-INR  APTT  TROPONIN I (HIGH SENSITIVITY)     EKG  ED ECG REPORT I, Merlyn Lot, the attending physician, personally viewed and interpreted this ECG.   Date: 08/14/2021  EKG Time: 11:40  Rate: 85  Rhythm: sinus  Axis: normal  Intervals:normal qt  ST&T Change: no stemi, no depression    RADIOLOGY Please see ED Course for my review and interpretation.  I personally reviewed all radiographic images ordered to evaluate for the above acute complaints and reviewed radiology reports and findings.  These findings were personally discussed with the patient.  Please see medical record for radiology report.    PROCEDURES:  Critical Care performed: No  Procedures   MEDICATIONS ORDERED IN ED: Medications  sodium chloride flush (NS) 0.9 % injection 3 mL (has no administration in time range)  diazepam (VALIUM) tablet 5 mg (5 mg Oral  Given 08/14/21 1346)     IMPRESSION / MDM / ASSESSMENT AND PLAN / ED COURSE  I reviewed the triage vital signs and the nursing notes.                              Differential diagnosis includes, but is not limited to, cva, tia, hypoglycemia, dehydration, electrolyte abnormality, dissection, sepsis  Patient presenting with symptoms of numbness and tingling left side of his body as well as right upper extremity.  Symptoms started around 10 but on exam patient has no objective deficits with an NIH of 0.  Do not believe that this meets criteria for code stroke.  Has a history of degenerative disc disease states has been doing heavy lifting have a higher suspicion for radiculopathy.  Given his age and risk factors do feel that MRI is clinically indicated.  He did endorse feeling significant anxiety and is requesting something for that therefore will give p.o. Valium.   Clinical Course as of 08/14/21 1525  Tue Aug 14, 2021  1344 Patient up ambulating no acute distress. [PR]  1444 Eosinophils Absolute: 0.0 [PR]  1521 Initial Trope was negative. [PR]  9937 MRI is reassuring.  Patient will be signed out to oncoming physician pending repeat Trope if negative will be appropriate for outpatient follow-up. [PR]    Clinical Course User Index [PR] Merlyn Lot, MD     FINAL CLINICAL IMPRESSION(S) / ED DIAGNOSES   Final diagnoses:  Paresthesia     Rx / DC Orders   ED Discharge Orders     None        Note:  This document was prepared using Dragon voice recognition software and may include unintentional dictation errors.    Merlyn Lot, MD 08/14/21 1525

## 2021-08-14 NOTE — ED Notes (Signed)
To bedside to place IV and collect trop; pt not present.

## 2021-08-14 NOTE — ED Notes (Signed)
Repeat trop sent to lab. Pt sitting calmly on stretcher. In NAD.

## 2021-08-14 NOTE — Discharge Instructions (Signed)
Please follow-up with the neurologist listed, Dr. Su Monks, if symptoms persist

## 2021-08-28 NOTE — Progress Notes (Signed)
[facility]  PATIENT: Derrick Murphy DOB: 03/04/1966  REFERRING DOCTOR OR PCP:  Dr. Ola Spurr Jefm Bryant) SOURCE: patient, notes from the emergency room, imaging and laboratory reports, MRI images of the brain and cervical spine personally reviewed.  _________________________________   HISTORICAL  CHIEF COMPLAINT:  Chief Complaint  Patient presents with   New Patient (Initial Visit)    Rm 2, alone. Pt referred by ED for numbness/paresthesia. Seen at the ED on 1/24. Pt reports severe numbness on L arm and now R arm. Numbness now on face/lips and L leg. Pt reports being dizzy and nauseas, off/on. Has degenerative disease in his lumbar and L rotator cuff injury.     HISTORY OF PRESENT ILLNESS:  I had the pleasure of seeing the patient, Derrick Murphy, at [facility] for neurologic consultation regarding his numbness.   He is a 56 year old man who has had intermittent numbness, moslty mild x months.   On 08/14/21, while at work, he had the onset of numbness in the left arm > left face and leg.  Symptoms built up over a few minutes.  He went to the ED.   Symptoms are less intense but have not subsided.  He has milder right sided numbness.   He notes mild weakness in the left arm and to a lesser extent in the right arm.   He reports the numbness is associated with tingling more than pain.   He notes he has urinary urgency and has nocturia several times a night.  Sometimes he also has hesitancy.    He did not get a benefit from Flomax.      In the ED he had MRI of the brain and cervical spine and there were no acute findings.    He has had AFIb and had an ablation.  He sees cardiology (Dr. Caryl Comes)  He is having anxiety and some panic attacks.   He also reports a pressure sensation in his face and tinnitus.  He has known pars defect L5S1.     IMAGING MRI of the brain 08/14/2021 shows some scattered T2/FLAIR hyperintense foci predominantly in the  subcortical white matter. The foci appear to be acute.  These nonspecific but most consistent with chronic microvascular ischemic change.  Follow-up migraine headaches.  MRI of the cervical spine 08/14/2021 is limited due to motion artifact.  The spinal cord appears normal.  There is disc protrusion and uncovertebral spurring at C6-C7 causing borderline spinal stenosis but no nerve root compression.  There is disc bulging at C3-C4.  MRI lumbar 07/19/2018 showed L5 chronic bilateral pars defects with L5-S1 anterolisthesis and accelerated disc degeneration. Bilateral foraminal narrowing at this level that could affect either L5 nerve root.  REVIEW OF SYSTEMS: Constitutional: No fevers, chills, sweats, or change in appetite Eyes: No visual changes, double vision, eye pain Ear, nose and throat: No hearing loss, ear pain, nasal congestion, sore throat Cardiovascular: No chest pain, palpitations Respiratory:  No shortness of breath at rest or with exertion.   No wheezes GastrointestinaI: No nausea, vomiting, diarrhea, abdominal pain, fecal incontinence Genitourinary:  No dysuria, urinary retention or frequency.  No nocturia. Musculoskeletal:  No neck pain, back pain Integumentary: No rash, pruritus, skin lesions Neurological: as above Psychiatric: No depression at this time.  No anxiety Endocrine: No palpitations, diaphoresis, change in appetite, change in weigh or increased thirst Hematologic/Lymphatic:  No anemia, purpura, petechiae. Allergic/Immunologic: No itchy/runny eyes, nasal congestion, recent allergic reactions, rashes  ALLERGIES: No Known Allergies  HOME MEDICATIONS:  Current Outpatient Medications:    cyclobenzaprine (FLEXERIL) 10 MG tablet, Take by mouth., Disp: , Rfl:    lisinopril (ZESTRIL) 10 MG tablet, Take 1 tablet (10 mg total) by mouth daily., Disp: 30 tablet, Rfl: 0   meloxicam (MOBIC) 15 MG tablet, Take 1 tablet (15 mg total) by mouth daily., Disp: 30 tablet, Rfl: 1    traZODone (DESYREL) 50 MG tablet, 1 tablet at bedtime as needed Orally Once a day for 30 day(s), Disp: , Rfl:    diltiazem (CARDIZEM CD) 180 MG 24 hr capsule, Take 1 capsule (180 mg total) by mouth 2 (two) times daily., Disp: 60 capsule, Rfl: 0   diltiazem (CARDIZEM) 30 MG tablet, Take 1 tablet (30 mg total) by mouth 2 (two) times daily as needed (Hr greater than 130)., Disp: 60 tablet, Rfl: 0  PAST MEDICAL HISTORY: Past Medical History:  Diagnosis Date   Alcohol abuse    Atrial fibrillation (HCC)    CHF (congestive heart failure) ([place])    8/18: EF 50-55%, mild LVH, RA/RV- mild dilation   Depression    Hypertension    Tobacco abuse     PAST SURGICAL HISTORY: Past Surgical History:  Procedure Laterality Date   ABLATION     2x   CARDIOVERSION     4x    FAMILY HISTORY: Family History  Problem Relation Age of Onset   Heart disease Mother    Heart disease Father    Heart disease Paternal Uncle     SOCIAL HISTORY:  Social History   Socioeconomic History   Marital status: Unknown    Spouse name: Not on file   Number of children: 0   Years of education: Not on file   Highest education level: Bachelor's degree (e.g., BA, AB, BS)  Occupational History   Not on file  Tobacco Use   Smoking status: Former    Types: Cigarettes    Quit date: 06/15/2016    Years since quitting: 5.2   Smokeless tobacco: Never  Vaping Use   Vaping Use: Never used  Substance and Sexual Activity   Alcohol use: Not Currently   Drug use: No   Sexual activity: Yes    Partners: Female  Other Topics Concern   Not on file  Social History Narrative   Lives alone   Right handed   Caffeine: 2-3 C of coffee AM   Social Determinants of Radio broadcast assistant Strain: Not on file  Food Insecurity: Not on file  Transportation Needs: Not on file  Physical Activity: Not on file  Stress: Not on file  Social Connections: Not on file  Intimate Partner Violence: Not on file     PHYSICAL  EXAM  Vitals:   08/29/21 1016  BP: (!) 152/78  Pulse: 68  Weight: 210 lb (95.3 kg)  Height: 5\' 8"  (1.727 m)    Body mass index is 31.93 kg/m.   General: The patient is well-developed and well-nourished and in no acute distress  HEENT:  Head is [place]/AT.  Sclera are anicteric.     Neck: No carotid bruits are noted.  The neck is nontender.  Cardiovascular: The heart has a regular rate and rhythm with a normal S1 and S2. There were no murmurs, gallops or rubs.    Skin: Extremities are without rash or  edema.  Musculoskeletal:  Back is nontender  Neurologic Exam  Mental status: The patient is alert and oriented x 3 at the time of the  examination. The patient has apparent normal recent and remote memory, with an apparently normal attention span and concentration ability.   Speech is normal.  Cranial nerves: Extraocular movements are full. Pupils are equal, round, and reactive to light and accomodation.  Visual fields are full.  Facial symmetry is present. There is good facial sensation to soft touch bilaterally.Facial strength is normal.  Trapezius and sternocleidomastoid strength is normal. No dysarthria is noted.    No obvious hearing deficits are noted.  Motor:  Muscle bulk is normal.   Tone is normal. Strength is  5 / 5 in all 4 extremities.   Sensory: Sensory testing is intact to pinprick, soft touch and vibration sensation in all 4 extremities except mild reduced temperature/touch in left hand.   Coordination: Cerebellar testing reveals good finger-nose-finger and heel-to-shin bilaterally.  Gait and station: Station is normal.   Gait is normal. Tandem gait is normal. Romberg is negative.   Reflexes: Deep tendon reflexes are symmetric and normal in arms, 3 at knees.   Plantar responses are flexor.    DIAGNOSTIC DATA (LABS, IMAGING, TESTING) - I reviewed patient records, labs, notes, testing and imaging myself where available.  Lab Results  Component Value Date   WBC 15.7  (H) 08/14/2021   HGB 14.4 08/14/2021   HCT 42.7 08/14/2021   MCV 92.8 08/14/2021   PLT 297 08/14/2021      Component Value Date/Time   NA 129 (L) 08/14/2021 1142   NA 138 02/07/2017 0845   K 3.8 08/14/2021 1142   CL 97 (L) 08/14/2021 1142   CO2 20 (L) 08/14/2021 1142   GLUCOSE 128 (H) 08/14/2021 1142   BUN 27 (H) 08/14/2021 1142   BUN 14 02/07/2017 0845   CREATININE 1.19 08/14/2021 1142   CALCIUM 9.0 08/14/2021 1142   PROT 7.7 08/14/2021 1142   PROT 7.1 02/07/2017 0845   ALBUMIN 4.8 08/14/2021 1142   ALBUMIN 4.0 02/07/2017 0845   AST 20 08/14/2021 1142   ALT 23 08/14/2021 1142   ALKPHOS 50 08/14/2021 1142   BILITOT 0.7 08/14/2021 1142   BILITOT <0.2 02/07/2017 0845   GFRNONAA >60 08/14/2021 1142   GFRAA >60 09/16/2018 0448   Lab Results  Component Value Date   CHOL 245 (H) 02/07/2017   HDL 28 (L) 02/07/2017   LDLCALC Comment 02/07/2017   TRIG 1,020 (HH) 02/07/2017   CHOLHDL 8.8 (H) 02/07/2017   Lab Results  Component Value Date   HGBA1C 5.4 02/07/2017   No results found for: VITAMINB12 Lab Results  Component Value Date   TSH 1.590 02/07/2017       ASSESSMENT AND PLAN  Paresthesias - Plan: NCV with EMG(electromyography)  Numbness of arm - Plan: NCV with EMG(electromyography)  Pars defect with spondylolisthesis  History of atrial fibrillation  Urinary frequency   In summary, Mr. Feltus is a 56 year old man with subjective neurologic symptoms including numbness in the left arm greater than the right arm and in the left face and leg.  Additionally he reports some weakness in the left arm.  The etiology of his symptoms is unclear.  MRI of the brain shows some nonspecific T2/FLAIR hyperintense foci in the subcortical white matter.  This is most likely chronic microvascular ischemic change will could also be related to his history of A-fib in the past.  He has been in normal sinus rhythm and no longer needs anticoagulants.  MRI of the cervical spine showed  some degenerative changes but nothing bad enough to cause numbness  or weakness.  Therefore, we will check NCV/EMG to better assess the nerves and nerve roots in the arms  He also has anxiety.  This could be amplifying the symptoms.  I will have him start BuSpar and titrate to 50 mg twice a day.  Additionally, he has pain in the lower back and has known pars defects with anterolisthesis at L5-S1.  I recommended that he see pain management if symptoms worsen.  He also reports urinary hesitancy and urgency.  Flomax has not helped him in the past.  I recommended that he consider seeing urology if symptoms worsen.  I will see him when he returns for the NCV/EMG study and he should call sooner if major new or worsening symptoms.  Thank you for asking me to see Mr. Dwain Sarna.  Please let me know if I can be of further assistance with him or other patients in the future.  Kalya Troeger A. Felecia Shelling, MD, [facility] 09/21/2949, 88:41 AM Certified in Neurology, Clinical Neurophysiology, Sleep Medicine and Neuroimaging  [facility] [address], [place], [place] [address] [phone]

## 2021-08-29 ENCOUNTER — Ambulatory Visit: Payer: 59 | Admitting: Neurology

## 2021-08-29 ENCOUNTER — Encounter: Payer: Self-pay | Admitting: Neurology

## 2021-08-29 VITALS — BP 152/78 | HR 68 | Ht 68.0 in | Wt 210.0 lb

## 2021-08-29 DIAGNOSIS — M4310 Spondylolisthesis, site unspecified: Secondary | ICD-10-CM | POA: Insufficient documentation

## 2021-08-29 DIAGNOSIS — R200 Anesthesia of skin: Secondary | ICD-10-CM | POA: Diagnosis not present

## 2021-08-29 DIAGNOSIS — R350 Frequency of micturition: Secondary | ICD-10-CM

## 2021-08-29 DIAGNOSIS — R202 Paresthesia of skin: Secondary | ICD-10-CM | POA: Diagnosis not present

## 2021-08-29 DIAGNOSIS — M4300 Spondylolysis, site unspecified: Secondary | ICD-10-CM

## 2021-08-29 DIAGNOSIS — Z8679 Personal history of other diseases of the circulatory system: Secondary | ICD-10-CM | POA: Diagnosis not present

## 2021-08-29 MED ORDER — ETODOLAC 400 MG PO TABS: 400.0000 mg | ORAL_TABLET | Freq: Two times a day (BID) | ORAL | 5 refills | Status: DC

## 2021-08-29 MED ORDER — BUSPIRONE HCL 15 MG PO TABS: 15.0000 mg | ORAL_TABLET | Freq: Two times a day (BID) | ORAL | 5 refills | Status: DC

## 2021-09-06 ENCOUNTER — Encounter: Payer: Self-pay | Admitting: Internal Medicine

## 2021-09-06 ENCOUNTER — Other Ambulatory Visit: Payer: Self-pay

## 2021-09-06 ENCOUNTER — Ambulatory Visit (INDEPENDENT_AMBULATORY_CARE_PROVIDER_SITE_OTHER): Payer: 59 | Admitting: Internal Medicine

## 2021-09-06 VITALS — BP 112/66 | HR 62 | Ht 68.0 in | Wt 211.0 lb

## 2021-09-06 DIAGNOSIS — I483 Typical atrial flutter: Secondary | ICD-10-CM

## 2021-09-06 DIAGNOSIS — I4811 Longstanding persistent atrial fibrillation: Secondary | ICD-10-CM

## 2021-09-06 NOTE — Patient Instructions (Signed)

## 2021-09-06 NOTE — Progress Notes (Signed)
Patient Care Team: [place] as PCP - General Rockey Situ, Kathlene November, MD as Consulting Physician (Cardiology) Leone Haven, MD (Family Medicine)   HPI  Derrick Murphy is a 56 y.o. male Seen in follow-up for atrial arrhythmias. He has undergone prior ablation pulmonary veins, cava tricuspid isthmus and roof. 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  Seen there 3/22 and last seen here 3/20   ECG 7/18 >> narrow QRS tachycardia with flutter 2;1  he is anticoagulated with apixoban  12/21 he underwent catheter ablation again at [facility].  Has had some skips since then the longest lasted 6-8 hours, not sure if it was atrial fibrillation or not.  Dr. Willis Modena discontinued his anticoagulation.  Recurrent problems with chest pain.  Stabbing unrelated to exertion lasts 2-3 minutes.  Always in the middle of his chest.  Underwent treadmill testing at [facility] but could not do nuclear imaging.  He has lost about 80 pounds, 50 pounds since we last saw him.  By discontinuing alcohol, exercising and decreasing calories.   Recently seen in the emergency room because of left-sided paresthesias.  MRI imaging was negative.  Neurological evaluation does not express a concern about stroke.  Nerve conduction studies are pending.   DATE TEST EF    12/16 Echo   25-30 %    8/18 Echo   50-55 %    2/20 Echo 60-65 %        Date Cr Hgb TSH  7/18 0.65 14.7 1.59  2/20 1.06 14.9        Thromboembolic risk factors (, HTN--1.  ) for a CHADSVASc Score of >=1   Records and Results Reviewed   Past Medical History:  Diagnosis Date   Alcohol abuse    Atrial fibrillation (HCC)    CHF (congestive heart failure) ([place])    8/18: EF 50-55%, mild LVH, RA/RV- mild dilation   Depression    Hypertension    Tobacco abuse     Past Surgical History:  Procedure Laterality Date   ABLATION     2x   CARDIOVERSION     4x    Current Meds  Medication Sig   busPIRone (BUSPAR) 15 MG tablet Take 1  tablet (15 mg total) by mouth 2 (two) times daily.   cyclobenzaprine (FLEXERIL) 10 MG tablet Take by mouth.   diltiazem (CARDIZEM CD) 180 MG 24 hr capsule Take 1 capsule (180 mg total) by mouth 2 (two) times daily.   diltiazem (CARDIZEM) 30 MG tablet Take 1 tablet (30 mg total) by mouth 2 (two) times daily as needed (Hr greater than 130).   etodolac (LODINE) 400 MG tablet Take 1 tablet (400 mg total) by mouth 2 (two) times daily.   lisinopril (ZESTRIL) 10 MG tablet Take 1 tablet (10 mg total) by mouth daily.   traZODone (DESYREL) 50 MG tablet 1 tablet at bedtime as needed Orally Once a day for 30 day(s)    No Known Allergies    Review of Systems negative except from HPI and PMH  Physical Exam BP 112/66 (BP Location: Left Arm, Patient Position: Sitting, Cuff Size: Normal)    Pulse 62    Ht 5\' 8"  (1.727 m)    Wt 211 lb (95.7 kg)    SpO2 97%    BMI 32.08 kg/m  Well developed and well nourished in no acute distress HENT normal E scleral and icterus clear Neck Supple JVP flat; carotids brisk and  full Clear to ausculation Regular rate and rhythm, no murmurs gallops or rub Soft with active bowel sounds No clubbing cyanosis  Edema Alert and oriented, grossly normal motor and sensory function Skin Warm and Dry  ECG sinus at 62 Intervals 19/08/39  CrCl cannot be calculated (Patient's most recent lab result is older than the maximum 21 days allowed.).   Assessment and  Plan  Atrial fibrillation/flutter-persistent    PVI and roof lesions [facility] 2016 recurrent catheter ablation [facility] 2021  Chest pain-atypical  Syncope   Cardiomyopathy-presumed nonischemic with interval resolution    Hypertension  No interval sustained atrial arrhythmias of which she is aware.  The 6-8-hour event may or may not be A-fib.  He asks about anticoagulation, a decision that was made between him and Dr. Shan Levans at [facility].  It is reasonable I think with a CHA2DS2-VASc score of 1 (LV function having resolved) that  even if you are having atrial fibrillation but there being no strong indication for the resuming of anticoagulation especially at his age.   Chest pain I think is noncardiac.  He underwent treadmill testing.  We discussed the possibility of calcium scoring or CTA; in the absence of exertional discomfort despite his vigorous effort and the fact that his CT scan 3/19 makes no comment on coronary artery calcification we will not pursue it at this time.

## 2021-09-07 ENCOUNTER — Other Ambulatory Visit: Payer: Self-pay | Admitting: Neurology

## 2021-09-07 ENCOUNTER — Telehealth: Payer: Self-pay | Admitting: Neurology

## 2021-09-07 NOTE — Telephone Encounter (Signed)
Patient paged the on call says he need Gabapentin. I don't see any mention of Gabapentin in Dr. Garth Bigness last note, asked patient to call back Monday

## 2021-09-14 ENCOUNTER — Ambulatory Visit: Payer: 59 | Admitting: Podiatry

## 2021-09-18 ENCOUNTER — Emergency Department: Payer: 59

## 2021-09-18 ENCOUNTER — Other Ambulatory Visit: Payer: Self-pay

## 2021-09-18 ENCOUNTER — Encounter: Payer: Self-pay | Admitting: Emergency Medicine

## 2021-09-18 ENCOUNTER — Emergency Department
Admission: EM | Admit: 2021-09-18 | Discharge: 2021-09-18 | Disposition: A | Discharge status: Home / Self Care | Payer: 59 | Attending: Emergency Medicine | Admitting: Emergency Medicine

## 2021-09-18 DIAGNOSIS — I509 Heart failure, unspecified: Secondary | ICD-10-CM | POA: Diagnosis not present

## 2021-09-18 DIAGNOSIS — Y92410 Unspecified street and highway as the place of occurrence of the external cause: Secondary | ICD-10-CM | POA: Insufficient documentation

## 2021-09-18 DIAGNOSIS — Z87891 Personal history of nicotine dependence: Secondary | ICD-10-CM | POA: Diagnosis not present

## 2021-09-18 DIAGNOSIS — S60511A Abrasion of right hand, initial encounter: Secondary | ICD-10-CM | POA: Diagnosis not present

## 2021-09-18 DIAGNOSIS — S80211A Abrasion, right knee, initial encounter: Secondary | ICD-10-CM | POA: Insufficient documentation

## 2021-09-18 DIAGNOSIS — S42211A Unspecified displaced fracture of surgical neck of right humerus, initial encounter for closed fracture: Secondary | ICD-10-CM | POA: Insufficient documentation

## 2021-09-18 DIAGNOSIS — S2231XA Fracture of one rib, right side, initial encounter for closed fracture: Secondary | ICD-10-CM | POA: Diagnosis not present

## 2021-09-18 DIAGNOSIS — S4991XA Unspecified injury of right shoulder and upper arm, initial encounter: Secondary | ICD-10-CM | POA: Diagnosis present

## 2021-09-18 DIAGNOSIS — I4891 Unspecified atrial fibrillation: Secondary | ICD-10-CM | POA: Insufficient documentation

## 2021-09-18 DIAGNOSIS — S60221A Contusion of right hand, initial encounter: Secondary | ICD-10-CM | POA: Diagnosis not present

## 2021-09-18 DIAGNOSIS — I110 Hypertensive heart disease with heart failure: Secondary | ICD-10-CM | POA: Insufficient documentation

## 2021-09-18 MED ORDER — MORPHINE SULFATE (PF) 4 MG/ML IV SOLN
4.0000 mg | Freq: Once | INTRAVENOUS | Status: AC
  Administered 2021-09-18: 4 mg via INTRAMUSCULAR
  Filled 2021-09-18: qty 1

## 2021-09-18 MED ORDER — ORPHENADRINE CITRATE 30 MG/ML IJ SOLN
60.0000 mg | Freq: Two times a day (BID) | INTRAMUSCULAR | Status: DC
  Administered 2021-09-18: 60 mg via INTRAMUSCULAR
  Filled 2021-09-18: qty 2

## 2021-09-18 MED ORDER — MORPHINE SULFATE (PF) 4 MG/ML IV SOLN
4.0000 mg | Freq: Once | INTRAVENOUS | Status: AC
  Administered 2021-09-18: 4 mg via INTRAVENOUS
  Filled 2021-09-18: qty 1

## 2021-09-18 MED ORDER — ONDANSETRON HCL 4 MG/2ML IJ SOLN
4.0000 mg | Freq: Once | INTRAMUSCULAR | Status: AC
  Administered 2021-09-18: 4 mg via INTRAVENOUS
  Filled 2021-09-18: qty 2

## 2021-09-18 MED ORDER — CYCLOBENZAPRINE HCL 5 MG PO TABS: 5.0000 mg | ORAL_TABLET | Freq: Three times a day (TID) | ORAL | 0 refills | Status: DC | PRN

## 2021-09-18 MED ORDER — KETOROLAC TROMETHAMINE 30 MG/ML IJ SOLN
30.0000 mg | Freq: Once | INTRAMUSCULAR | Status: AC
  Administered 2021-09-18: 30 mg via INTRAVENOUS
  Filled 2021-09-18: qty 1

## 2021-09-18 MED ORDER — OXYCODONE-ACETAMINOPHEN 10-325 MG PO TABS: 1.0000 | ORAL_TABLET | Freq: Four times a day (QID) | ORAL | 0 refills | Status: DC | PRN

## 2021-09-18 NOTE — Discharge Instructions (Signed)
Call Dr. Nathanial Millman office for appointment time.  You can call the office today.  Pain medication and Flexeril was sent to the pharmacy.  Be aware that the 2 can cause drowsiness and increase your risk for injury.  Continue to elevate and ice to reduce swelling and help with pain.  Dr. Sharlet Salina would like to see you in the office to discuss treatment options.  If any severe worsening of your symptoms or inability to control your pain return to the emergency department.
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
## 2021-09-19 ENCOUNTER — Telehealth: Payer: Self-pay | Admitting: Physical Therapy
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
## 2021-09-21 ENCOUNTER — Other Ambulatory Visit: Payer: Self-pay | Admitting: Orthopedic Surgery

## 2021-09-22 ENCOUNTER — Telehealth: Payer: 59 | Admitting: Nurse Practitioner

## 2021-09-22 DIAGNOSIS — M25511 Pain in right shoulder: Secondary | ICD-10-CM | POA: Diagnosis not present

## 2021-09-22 MED ORDER — IBUPROFEN 800 MG PO TABS: 800.0000 mg | ORAL_TABLET | Freq: Three times a day (TID) | ORAL | 0 refills | Status: DC | PRN

## 2021-09-22 NOTE — Patient Instructions (Signed)
?  Derrick Murphy, thank you for joining Gildardo Pounds, NP for today's virtual visit.  While this provider is not your primary care provider (PCP), if your PCP is located in our provider database this encounter information will be shared with them immediately following your visit. ? ?Consent: ?(Patient) Derrick Murphy provided verbal consent for this virtual visit at the beginning of the encounter. ? ?Current Medications: ? ?Current Outpatient Medications:  ?  ibuprofen (ADVIL) 800 MG tablet, Take 1 tablet (800 mg total) by mouth every 8 (eight) hours as needed., Disp: 60 tablet, Rfl: 0 ?  busPIRone (BUSPAR) 15 MG tablet, Take 1 tablet (15 mg total) by mouth 2 (two) times daily., Disp: 60 tablet, Rfl: 5 ?  cyclobenzaprine (FLEXERIL) 5 MG tablet, Take 1 tablet (5 mg total) by mouth 3 (three) times daily as needed for muscle spasms., Disp: 12 tablet, Rfl: 0 ?  diltiazem (CARDIZEM CD) 180 MG 24 hr capsule, Take 1 capsule (180 mg total) by mouth 2 (two) times daily., Disp: 60 capsule, Rfl: 0 ?  diltiazem (CARDIZEM) 30 MG tablet, Take 1 tablet (30 mg total) by mouth 2 (two) times daily as needed (Hr greater than 130)., Disp: 60 tablet, Rfl: 0 ?  etodolac (LODINE) 400 MG tablet, Take 1 tablet (400 mg total) by mouth 2 (two) times daily., Disp: 60 tablet, Rfl: 5 ?  lisinopril (ZESTRIL) 10 MG tablet, Take 1 tablet (10 mg total) by mouth daily., Disp: 30 tablet, Rfl: 0 ?  oxyCODONE-acetaminophen (PERCOCET) 10-325 MG tablet, Take 1 tablet by mouth every 6 (six) hours as needed for pain., Disp: 20 tablet, Rfl: 0 ?  traZODone (DESYREL) 50 MG tablet, 1 tablet at bedtime as needed Orally Once a day for 30 day(s), Disp: , Rfl:   ? ?Medications ordered in this encounter:  ?Meds ordered this encounter  ?Medications  ? ibuprofen (ADVIL) 800 MG tablet  ?  Sig: Take 1 tablet (800 mg total) by mouth every 8 (eight) hours as needed.  ?  Dispense:  60 tablet  ?  Refill:  0  ?  Order Specific Question:   Supervising Provider  ?  Answer:    Noemi Chapel [[id number]]  ?  ? ?*If you need refills on other medications prior to your next appointment, please contact your pharmacy* ? ?Follow-Up: ?Call back or seek an in-person evaluation if the symptoms worsen or if the condition fails to improve as anticipated. ? ?Other Instructions ?Follow up with surgeon on 09-27-2021 as scheduled  ? ? ?If you have been instructed to have an in-person evaluation today at a local Urgent Care facility, please use the link below. It will take you to a list of all of our available [facility], including address, phone number and hours of operation. Please do not delay care.  ?[facility] ? ?If you or a family member do not have a primary care provider, use the link below to schedule a visit and establish care. When you choose a [facility] primary care physician or advanced practice provider, you gain a long-term partner in health. ?Find a Primary Care Provider ? ?Learn more about [facility] in-office and virtual care options: ?[facility]  ?

## 2021-09-22 NOTE — Progress Notes (Signed)
?Virtual Visit Consent  ? ?Derrick Murphy, you are scheduled for a virtual visit with a [facility] provider today.   ?  ?Just as with appointments in the office, your consent must be obtained to participate.  Your consent will be active for this visit and any virtual visit you may have with one of our providers in the next 365 days.   ?  ?If you have a MyChart account, a copy of this consent can be sent to you electronically.  All virtual visits are billed to your insurance company just like a traditional visit in the office.   ? ?As this is a virtual visit, video technology does not allow for your provider to perform a traditional examination.  This may limit your provider's ability to fully assess your condition.  If your provider identifies any concerns that need to be evaluated in person or the need to arrange testing (such as labs, EKG, etc.), we will make arrangements to do so.   ?  ?Although advances in technology are sophisticated, we cannot ensure that it will always work on either your end or our end.  If the connection with a video visit is poor, the visit may have to be switched to a telephone visit.  With either a video or telephone visit, we are not always able to ensure that we have a secure connection.    ? ?I need to obtain your verbal consent now.   Are you willing to proceed with your visit today?  ?  ?Derrick Murphy has provided verbal consent on 09/22/2021 for a virtual visit (video or telephone). ?  ?Gildardo Pounds, NP  ? ?Date: 09/22/2021 9:36 AM ? ? ?Virtual Visit via Video Note  ? ?IGildardo Pounds, connected with  Derrick Murphy  ([id number], 1965-11-27) on 09/22/21 at  9:30 AM EST by a video-enabled telemedicine application and verified that I am speaking with the correct person using two identifiers. ? ?Location: ?Patient: Virtual Visit Location Patient: Home ?Provider: Virtual Visit Location Provider: Home Office ?  ?I discussed the limitations of evaluation and management by telemedicine  and the availability of in person appointments. The patient expressed understanding and agreed to proceed.   ? ?History of Present Illness: ?Derrick Murphy is a 56 y.o. who identifies as a male who was assigned male at birth, and is being seen today for right humerus fracture pain. ? ?Requesting refill of pain medication for pain in right arm/shoulder. He has surgery pending 09-27-2021 but states he was not given enough pain medication in the ED on 09-18-2021 to last until his surgery date and he would like a refill. Explained to Derrick Murphy that we do not prescribe controlled substances however I would be happy to send ibuprofen until he can follow up in 2 days with the surgeon/orthopedist for refill of oxy if needed.  ? ? ?Problems:  ?Patient Active Problem List  ? Diagnosis Date Noted  ? Paresthesias 08/29/2021  ? Numbness of arm 08/29/2021  ? 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 History of atrial fibrillation 08/29/2021  ? Insomnia 08/10/2021  ? Lumbago with sciatica, left side 08/10/2021  ? 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 Hypertriglyceridemia 04/16/2019  ? Atrial fibrillation with RVR ([place]) 09/15/2018  ? Lumbar radiculopathy 09/02/2018  ? Spinal stenosis of lumbar region without neurogenic claudication 09/02/2018  ? Lumbar foraminal stenosis 09/02/2018  ? Lumbar degenerative disc disease 09/02/2018  ? Hepatic cirrhosis ([place]) 04/14/2018  ? Finger deformity, acquired,  right 04/14/2018  ? Multiple joint pain 04/14/2018  ? Skin lesion 10/17/2017  ? Anxiety 10/17/2017  ? Tachycardia 02/18/2017  ? Atrial fibrillation ([place]) 08/12/2016  ? Obesity (BMI 30-39.9) 08/12/2016  ? History of alcohol abuse 08/12/2016  ? Former smoker 08/12/2016  ? Chronic systolic CHF (congestive heart failure) ([place]) 08/12/2016  ? Benign essential HTN 08/12/2016  ? OSA on CPAP 08/12/2016  ? BPH (benign prostatic hyperplasia) 08/12/2016  ? Palpitations 10/19/2015  ? Typical atrial flutter ([place]) 07/16/2015  ? Depression  07/10/2015  ? ETOH abuse 07/10/2015  ? Tobacco abuse 07/10/2015  ?  ?Allergies: No Known Allergies ?Medications:  ?Current Outpatient Medications:  ?  busPIRone (BUSPAR) 15 MG tablet, Take 1 tablet (15 mg total) by mouth 2 (two) times daily., Disp: 60 tablet, Rfl: 5 ?  cyclobenzaprine (FLEXERIL) 5 MG tablet, Take 1 tablet (5 mg total) by mouth 3 (three) times daily as needed for muscle spasms., Disp: 12 tablet, Rfl: 0 ?  diltiazem (CARDIZEM CD) 180 MG 24 hr capsule, Take 1 capsule (180 mg total) by mouth 2 (two) times daily., Disp: 60 capsule, Rfl: 0 ?  diltiazem (CARDIZEM) 30 MG tablet, Take 1 tablet (30 mg total) by mouth 2 (two) times daily as needed (Hr greater than 130)., Disp: 60 tablet, Rfl: 0 ?  etodolac (LODINE) 400 MG tablet, Take 1 tablet (400 mg total) by mouth 2 (two) times daily., Disp: 60 tablet, Rfl: 5 ?  ibuprofen (ADVIL) 800 MG tablet, Take 1 tablet (800 mg total) by mouth every 8 (eight) hours as needed., Disp: 60 tablet, Rfl: 0 ?  lisinopril (ZESTRIL) 10 MG tablet, Take 1 tablet (10 mg total) by mouth daily., Disp: 30 tablet, Rfl: 0 ?  oxyCODONE-acetaminophen (PERCOCET) 10-325 MG tablet, Take 1 tablet by mouth every 6 (six) hours as needed for pain., Disp: 20 tablet, Rfl: 0 ?  traZODone (DESYREL) 50 MG tablet, 1 tablet at bedtime as needed Orally Once a day for 30 day(s), Disp: , Rfl:  ? ?Observations/Objective: ?Patient is well-developed, well-nourished in no acute distress.  ?Resting comfortably at home.  ?Head is normocephalic, atraumatic.  ?No labored breathing.  ?Speech is clear and coherent with logical content.  ?Patient is alert and oriented at baseline.  ? ? ?Assessment and Plan: ?1. Acute pain of right shoulder ?- ibuprofen (ADVIL) 800 MG tablet; Take 1 tablet (800 mg total) by mouth every 8 (eight) hours as needed.  Dispense: 60 tablet; Refill: 0 ? ? ?Follow Up Instructions: ?I discussed the assessment and treatment plan with the patient. The patient was provided an opportunity to ask  questions and all were answered. The patient agreed with the plan and demonstrated an understanding of the instructions.  A copy of instructions were sent to the patient via MyChart unless otherwise noted below.  ? ? ? ?The patient was advised to call back or seek an in-person evaluation if the symptoms worsen or if the condition fails to improve as anticipated. ? ?Time:  ?I spent 11 minutes with the patient via telehealth technology discussing the above problems/concerns.   ? ?Gildardo Pounds, NP  ?

## 2021-09-26 ENCOUNTER — Other Ambulatory Visit: Payer: Self-pay

## 2021-09-26 ENCOUNTER — Ambulatory Visit: Payer: 59 | Admitting: Physical Therapy

## 2021-09-26 ENCOUNTER — Other Ambulatory Visit: Payer: Self-pay | Admitting: Neurology

## 2021-09-26 ENCOUNTER — Other Ambulatory Visit
Admission: RE | Admit: 2021-09-26 | Discharge: 2021-09-26 | Disposition: A | Discharge status: Home / Self Care | Payer: 59 | Source: Ambulatory Visit | Attending: Orthopedic Surgery | Admitting: Orthopedic Surgery

## 2021-09-26 VITALS — BP 138/70 | HR 69 | Temp 98.1°F | Resp 18 | Ht 68.0 in | Wt 220.0 lb

## 2021-09-26 DIAGNOSIS — Z20822 Contact with and (suspected) exposure to covid-19: Secondary | ICD-10-CM | POA: Insufficient documentation

## 2021-09-26 DIAGNOSIS — Z01818 Encounter for other preprocedural examination: Secondary | ICD-10-CM | POA: Insufficient documentation

## 2021-09-26 DIAGNOSIS — Z01812 Encounter for preprocedural laboratory examination: Secondary | ICD-10-CM

## 2021-09-26 DIAGNOSIS — Z1152 Encounter for screening for COVID-19: Secondary | ICD-10-CM

## 2021-09-26 LAB — URINALYSIS, ROUTINE W REFLEX MICROSCOPIC
Bilirubin Urine: NEGATIVE
Glucose, UA: NEGATIVE mg/dL
Hgb urine dipstick: NEGATIVE
Ketones, ur: NEGATIVE mg/dL
Leukocytes,Ua: NEGATIVE
Nitrite: NEGATIVE
Protein, ur: NEGATIVE mg/dL
Specific Gravity, Urine: 1.02 (ref 1.005–1.030)
pH: 5 (ref 5.0–8.0)

## 2021-09-26 LAB — COMPREHENSIVE METABOLIC PANEL
ALT: 20 U/L (ref 0–44)
AST: 19 U/L (ref 15–41)
Albumin: 4 g/dL (ref 3.5–5.0)
Alkaline Phosphatase: 65 U/L (ref 38–126)
Anion gap: 8 (ref 5–15)
BUN: 24 mg/dL — ABNORMAL HIGH (ref 6–20)
CO2: 28 mmol/L (ref 22–32)
Calcium: 9.1 mg/dL (ref 8.9–10.3)
Chloride: 105 mmol/L (ref 98–111)
Creatinine, Ser: 0.89 mg/dL (ref 0.61–1.24)
GFR, Estimated: >60 mL/min (ref >60)
Glucose, Bld: 118 mg/dL — ABNORMAL HIGH (ref 70–99)
Potassium: 4.4 mmol/L (ref 3.5–5.1)
Sodium: 141 mmol/L (ref 135–145)
Total Bilirubin: 0.6 mg/dL (ref 0.3–1.2)
Total Protein: 7 g/dL (ref 6.5–8.1)

## 2021-09-26 LAB — SURGICAL PCR SCREEN
MRSA, PCR: NEGATIVE
Staphylococcus aureus: NEGATIVE

## 2021-09-26 LAB — CBC WITH DIFFERENTIAL/PLATELET
Abs Immature Granulocytes: 0.23 10*3/uL — ABNORMAL HIGH (ref 0.00–0.07)
Basophils Absolute: 0.1 10*3/uL (ref 0.0–0.1)
Basophils Relative: 1 %
Eosinophils Absolute: 0.1 10*3/uL (ref 0.0–0.5)
Eosinophils Relative: 1 %
HCT: 35.1 % — ABNORMAL LOW (ref 39.0–52.0)
Hemoglobin: 11.2 g/dL — ABNORMAL LOW (ref 13.0–17.0)
Immature Granulocytes: 2 %
Lymphocytes Relative: 12 %
Lymphs Abs: 1.2 10*3/uL (ref 0.7–4.0)
MCH: 31.2 pg (ref 26.0–34.0)
MCHC: 31.9 g/dL (ref 30.0–36.0)
MCV: 97.8 fL (ref 80.0–100.0)
Monocytes Absolute: 0.7 10*3/uL (ref 0.1–1.0)
Monocytes Relative: 6 %
Neutro Abs: 8.2 10*3/uL — ABNORMAL HIGH (ref 1.7–7.7)
Neutrophils Relative %: 78 %
Platelets: 308 10*3/uL (ref 150–400)
RBC: 3.59 MIL/uL — ABNORMAL LOW (ref 4.22–5.81)
RDW: 15 % (ref 11.5–15.5)
WBC: 10.4 10*3/uL (ref 4.0–10.5)
nRBC: 0 % (ref 0.0–0.2)

## 2021-09-26 LAB — SARS CORONAVIRUS 2 (TAT 6-24 HRS): SARS Coronavirus 2: NEGATIVE

## 2021-09-26 LAB — TYPE AND SCREEN
ABO/RH(D): A POS
Antibody Screen: NEGATIVE

## 2021-09-26 NOTE — Patient Instructions (Signed)
?Your procedure is scheduled on: Thursday September 27, 2021. ?Report to Day Surgery inside [facility] 2nd floor, stop by admissions desk before getting on elevator. ?To find out your arrival time please call [phone] between 1PM - 3PM on Wednesday September 26, 2021. ? ?Remember: Instructions that are not followed completely may result in serious medical risk,  ?up to and including death, or upon the discretion of your surgeon and anesthesiologist your  ?surgery may need to be rescheduled.  ? ?  _X__ 1. Do not eat food after midnight the night before your procedure. ?                No chewing gum or hard candies. You may drink clear liquids up to 2 hours ?                before you are scheduled to arrive for your surgery- DO not drink clear ?                liquids within 2 hours of the start of your surgery. ?                Clear Liquids include:  water, apple juice without pulp, clear Gatorade, G2 or  ?                Gatorade Zero (avoid Red/Purple/Blue), Black Coffee or Tea (Do not add ?                anything to coffee or tea). ? ?__X__2.   Complete the "Ensure Clear Pre-surgery Clear Carbohydrate Drink" provided to you, 2 hours before arrival. **If you are diabetic you will be provided with an alternative drink, Gatorade Zero or G2. ? ?__X__3.  On the morning of surgery brush your teeth with toothpaste and water, you ?               may rinse your mouth with mouthwash if you wish.  Do not swallow any toothpaste of mouthwash. ?   ? _X__ 4.  No Alcohol for 24 hours before or after surgery. ? ? _X__ 5.  Do Not Smoke or use e-cigarettes For 24 Hours Prior to Your Surgery. ?                Do not use any chewable tobacco products for at least 6 hours prior to ?                Surgery. ? ?_X__  6.  Do not use any recreational drugs (marijuana, cocaine, heroin, ecstasy, MDMA or other) ?               For at least one week prior to your surgery.  Combination of these drugs with anesthesia ?                May have life threatening results. ? ?____  7.  Bring all medications with you on the day of surgery if instructed.  ? ?__X__8.  Notify your doctor if there is any change in your medical condition  ?    (cold, fever, infections). ?    ?Do not wear jewelry, make-up, hairpins, clips or nail polish. ?Do not wear lotions, powders, or perfumes. You may wear deodorant. ?Do not shave 48 hours prior to surgery. Men may shave face and neck. ?Do not bring valuables to the hospital.   ? ?[facility] is not responsible for any belongings or valuables. ? ?Contacts, dentures  or bridgework may not be worn into surgery. ?Leave your suitcase in the car. After surgery it may be brought to your room. ?For patients admitted to the hospital, discharge time is determined by your ?treatment team. ?  ?Patients discharged the day of surgery will not be allowed to drive home.   ?Make arrangements for someone to be with you for the first 24 hours of your ?Same Day Discharge. ? ? ?__X__ Take these medicines the morning of surgery with A SIP OF WATER:  ? ? 1. diltiazem (CARDIZEM CD) 180 MG  ? 2.  ? 3.  ? 4. ? 5. ? 6. ? ?____ Fleet Enema (as directed)  ? ?__X__ Use CHG Soap (or wipes) as directed ? ?____ Use Benzoyl Peroxide Gel as instructed ? ?____ Use inhalers on the day of surgery ? ?____ Stop metformin 2 days prior to surgery   ? ?____ Take 1/2 of usual insulin dose the night before surgery. No insulin the morning ?         of surgery.  ? ?____ Call your PCP, cardiologist, or Pulmonologist if taking Coumadin/Plavix/aspirin and ask when to stop before your surgery.  ? ?__X__ One Week prior to surgery- Stop Anti-inflammatories such as Ibuprofen, Aleve, Advil, Motrin, meloxicam (MOBIC), diclofenac, etodolac, ketorolac, Toradol, Daypro, piroxicam, Goody's or BC powders. OK TO USE TYLENOL IF NEEDED ?  ?__X__ Do not start any vitamins and or herbal supplements until after surgery.   ? ?____ Bring C-Pap to the hospital.  ? ? ?If you  have any questions regarding your pre-procedure instructions,  ?Please call Pre-admit Testing at [phone] ?

## 2021-09-27 ENCOUNTER — Other Ambulatory Visit: Payer: Self-pay

## 2021-09-27 ENCOUNTER — Ambulatory Visit: Payer: 59

## 2021-09-27 ENCOUNTER — Ambulatory Visit: Payer: 59 | Admitting: Certified Registered Nurse Anesthetist

## 2021-09-27 ENCOUNTER — Inpatient Hospital Stay
Admission: RE | Admit: 2021-09-27 | Discharge: 2021-09-28 | DRG: 494 | Disposition: A | Discharge status: Home / Self Care | Payer: 59 | Attending: Orthopedic Surgery | Admitting: Orthopedic Surgery

## 2021-09-27 ENCOUNTER — Inpatient Hospital Stay: Payer: 59

## 2021-09-27 ENCOUNTER — Encounter: Payer: Self-pay | Admitting: Orthopedic Surgery

## 2021-09-27 ENCOUNTER — Encounter
Admission: RE | Disposition: A | Discharge status: Home / Self Care | Payer: Self-pay | Source: Home / Self Care | Attending: Orthopedic Surgery

## 2021-09-27 DIAGNOSIS — Z96619 Presence of unspecified artificial shoulder joint: Secondary | ICD-10-CM

## 2021-09-27 DIAGNOSIS — E669 Obesity, unspecified: Secondary | ICD-10-CM | POA: Diagnosis present

## 2021-09-27 DIAGNOSIS — S42301A Unspecified fracture of shaft of humerus, right arm, initial encounter for closed fracture: Secondary | ICD-10-CM

## 2021-09-27 DIAGNOSIS — S42231A 3-part fracture of surgical neck of right humerus, initial encounter for closed fracture: Secondary | ICD-10-CM | POA: Diagnosis present

## 2021-09-27 DIAGNOSIS — Y929 Unspecified place or not applicable: Secondary | ICD-10-CM

## 2021-09-27 DIAGNOSIS — Z885 Allergy status to narcotic agent status: Secondary | ICD-10-CM | POA: Diagnosis not present

## 2021-09-27 DIAGNOSIS — S42209A Unspecified fracture of upper end of unspecified humerus, initial encounter for closed fracture: Secondary | ICD-10-CM | POA: Diagnosis present

## 2021-09-27 DIAGNOSIS — I509 Heart failure, unspecified: Secondary | ICD-10-CM | POA: Clinically undetermined

## 2021-09-27 DIAGNOSIS — Z6831 Body mass index (BMI) 31.0-31.9, adult: Secondary | ICD-10-CM | POA: Diagnosis not present

## 2021-09-27 DIAGNOSIS — I110 Hypertensive heart disease with heart failure: Secondary | ICD-10-CM | POA: Diagnosis present

## 2021-09-27 DIAGNOSIS — W19XXXA Unspecified fall, initial encounter: Secondary | ICD-10-CM | POA: Diagnosis present

## 2021-09-27 DIAGNOSIS — Z1152 Encounter for screening for COVID-19: Secondary | ICD-10-CM

## 2021-09-27 DIAGNOSIS — Z20822 Contact with and (suspected) exposure to covid-19: Secondary | ICD-10-CM | POA: Diagnosis present

## 2021-09-27 LAB — ABO/RH: ABO/RH(D): A POS

## 2021-09-27 SURGERY — OPEN REDUCTION INTERNAL FIXATION (ORIF) PROXIMAL HUMERUS FRACTURE
Anesthesia: General | Laterality: Right

## 2021-09-27 MED ORDER — DEXAMETHASONE SODIUM PHOSPHATE 10 MG/ML IJ SOLN
INTRAMUSCULAR | Status: AC
  Filled 2021-09-27: qty 1

## 2021-09-27 MED ORDER — GLYCOPYRROLATE 0.2 MG/ML IJ SOLN
INTRAMUSCULAR | Status: DC | PRN
Start: 2021-09-27 — End: 2021-09-27
  Administered 2021-09-27: .2 mg via INTRAVENOUS

## 2021-09-27 MED ORDER — PHENOL 1.4 % MT LIQD
1.0000 | OROMUCOSAL | Status: DC | PRN
  Filled 2021-09-27: qty 177

## 2021-09-27 MED ORDER — METOCLOPRAMIDE HCL 10 MG PO TABS: 5.0000 mg | ORAL_TABLET | Freq: Three times a day (TID) | ORAL | Status: DC | PRN

## 2021-09-27 MED ORDER — BUPIVACAINE HCL (PF) 0.5 % IJ SOLN
INTRAMUSCULAR | Status: AC
  Filled 2021-09-27: qty 30

## 2021-09-27 MED ORDER — BISACODYL 10 MG RE SUPP: 10.0000 mg | Freq: Every day | RECTAL | Status: DC | PRN

## 2021-09-27 MED ORDER — 0.9 % SODIUM CHLORIDE (POUR BTL) OPTIME
TOPICAL | Status: DC | PRN
  Administered 2021-09-27: 17:00:00 1000 mL

## 2021-09-27 MED ORDER — LIDOCAINE HCL (CARDIAC) PF 100 MG/5ML IV SOSY
PREFILLED_SYRINGE | INTRAVENOUS | Status: DC | PRN
  Administered 2021-09-27: 100 mg via INTRAVENOUS

## 2021-09-27 MED ORDER — ORAL CARE MOUTH RINSE: 15.0000 mL | Freq: Once | OROMUCOSAL | Status: AC

## 2021-09-27 MED ORDER — CEFAZOLIN SODIUM-DEXTROSE 2-4 GM/100ML-% IV SOLN
INTRAVENOUS | Status: AC
  Filled 2021-09-27: qty 100

## 2021-09-27 MED ORDER — SUCCINYLCHOLINE CHLORIDE 200 MG/10ML IV SOSY
PREFILLED_SYRINGE | INTRAVENOUS | Status: AC
  Filled 2021-09-27: qty 10

## 2021-09-27 MED ORDER — HYDROMORPHONE HCL 1 MG/ML IJ SOLN
INTRAMUSCULAR | Status: AC
  Administered 2021-09-27: 18:00:00 1 mg via INTRAVENOUS
  Filled 2021-09-27: qty 1

## 2021-09-27 MED ORDER — ONDANSETRON HCL 4 MG/2ML IJ SOLN
INTRAMUSCULAR | Status: AC
  Filled 2021-09-27: qty 2

## 2021-09-27 MED ORDER — CHLORHEXIDINE GLUCONATE 0.12 % MT SOLN: 15.0000 mL | Freq: Once | OROMUCOSAL | Status: AC

## 2021-09-27 MED ORDER — ROCURONIUM BROMIDE 10 MG/ML (PF) SYRINGE
PREFILLED_SYRINGE | INTRAVENOUS | Status: AC
  Filled 2021-09-27: qty 10

## 2021-09-27 MED ORDER — DOCUSATE SODIUM 100 MG PO CAPS
100.0000 mg | ORAL_CAPSULE | Freq: Two times a day (BID) | ORAL | Status: DC
  Administered 2021-09-27 – 2021-09-28 (×2): 100 mg via ORAL
  Filled 2021-09-27 (×2): qty 1

## 2021-09-27 MED ORDER — MENTHOL 3 MG MT LOZG
1.0000 | LOZENGE | OROMUCOSAL | Status: DC | PRN
  Filled 2021-09-27: qty 9

## 2021-09-27 MED ORDER — PHENYLEPHRINE HCL-NACL 20-0.9 MG/250ML-% IV SOLN
INTRAVENOUS | Status: AC
  Filled 2021-09-27: qty 250

## 2021-09-27 MED ORDER — FENTANYL CITRATE (PF) 100 MCG/2ML IJ SOLN
INTRAMUSCULAR | Status: AC
  Filled 2021-09-27: qty 2

## 2021-09-27 MED ORDER — SODIUM CHLORIDE 0.9 % IV SOLN: INTRAVENOUS | Status: DC

## 2021-09-27 MED ORDER — FENTANYL CITRATE (PF) 100 MCG/2ML IJ SOLN
INTRAMUSCULAR | Status: AC
  Administered 2021-09-27: 18:00:00 25 ug via INTRAVENOUS
  Filled 2021-09-27: qty 2

## 2021-09-27 MED ORDER — PROPOFOL 10 MG/ML IV BOLUS
INTRAVENOUS | Status: DC | PRN
  Administered 2021-09-27: 150 mg via INTRAVENOUS
  Administered 2021-09-27: 50 mg via INTRAVENOUS

## 2021-09-27 MED ORDER — FAMOTIDINE 20 MG PO TABS: 20.0000 mg | ORAL_TABLET | Freq: Once | ORAL | Status: AC

## 2021-09-27 MED ORDER — TRANEXAMIC ACID-NACL 1000-0.7 MG/100ML-% IV SOLN
INTRAVENOUS | Status: DC | PRN
  Administered 2021-09-27: 1000 mg via INTRAVENOUS

## 2021-09-27 MED ORDER — ACETAMINOPHEN 500 MG PO TABS
1000.0000 mg | ORAL_TABLET | Freq: Three times a day (TID) | ORAL | Status: DC
  Administered 2021-09-27 – 2021-09-28 (×3): 1000 mg via ORAL
  Filled 2021-09-27 (×3): qty 2

## 2021-09-27 MED ORDER — OXYCODONE HCL 5 MG PO TABS: 5.0000 mg | ORAL_TABLET | ORAL | Status: DC | PRN

## 2021-09-27 MED ORDER — FENTANYL CITRATE PF 50 MCG/ML IJ SOSY: 50.0000 ug | PREFILLED_SYRINGE | Freq: Once | INTRAMUSCULAR | Status: AC

## 2021-09-27 MED ORDER — FENTANYL CITRATE (PF) 100 MCG/2ML IJ SOLN
INTRAMUSCULAR | Status: DC | PRN
Start: 2021-09-27 — End: 2021-09-27
  Administered 2021-09-27 (×2): 50 ug via INTRAVENOUS

## 2021-09-27 MED ORDER — ACETAMINOPHEN 10 MG/ML IV SOLN
INTRAVENOUS | Status: AC
  Filled 2021-09-27: qty 100

## 2021-09-27 MED ORDER — TRANEXAMIC ACID 1000 MG/10ML IV SOLN
INTRAVENOUS | Status: AC
  Filled 2021-09-27: qty 10

## 2021-09-27 MED ORDER — ASPIRIN EC 325 MG PO TBEC
325.0000 mg | DELAYED_RELEASE_TABLET | Freq: Every day | ORAL | Status: DC
  Administered 2021-09-28: 325 mg via ORAL
  Filled 2021-09-27: qty 1

## 2021-09-27 MED ORDER — LISINOPRIL 10 MG PO TABS
10.0000 mg | ORAL_TABLET | Freq: Every day | ORAL | Status: DC
Start: 2021-09-28 — End: 2021-09-28
  Administered 2021-09-28: 10 mg via ORAL
  Filled 2021-09-27: qty 1

## 2021-09-27 MED ORDER — BUPIVACAINE LIPOSOME 1.3 % IJ SUSP
INTRAMUSCULAR | Status: DC | PRN
  Administered 2021-09-27: 50 mL

## 2021-09-27 MED ORDER — CEFAZOLIN SODIUM-DEXTROSE 2-4 GM/100ML-% IV SOLN
2.0000 g | Freq: Four times a day (QID) | INTRAVENOUS | Status: AC
  Administered 2021-09-27 – 2021-09-28 (×3): 2 g via INTRAVENOUS
  Filled 2021-09-27 (×3): qty 100

## 2021-09-27 MED ORDER — ONDANSETRON HCL 4 MG/2ML IJ SOLN: 4.0000 mg | Freq: Four times a day (QID) | INTRAMUSCULAR | Status: DC | PRN

## 2021-09-27 MED ORDER — FENTANYL CITRATE PF 50 MCG/ML IJ SOSY
PREFILLED_SYRINGE | INTRAMUSCULAR | Status: AC
  Administered 2021-09-27: 12:00:00 50 ug via INTRAVENOUS
  Filled 2021-09-27: qty 1

## 2021-09-27 MED ORDER — DEXAMETHASONE SODIUM PHOSPHATE 10 MG/ML IJ SOLN
INTRAMUSCULAR | Status: DC | PRN
  Administered 2021-09-27: 5 mg via INTRAVENOUS

## 2021-09-27 MED ORDER — PROPOFOL 10 MG/ML IV BOLUS
INTRAVENOUS | Status: AC
  Filled 2021-09-27: qty 20

## 2021-09-27 MED ORDER — SUGAMMADEX SODIUM 200 MG/2ML IV SOLN
INTRAVENOUS | Status: DC | PRN
Start: 2021-09-27 — End: 2021-09-27
  Administered 2021-09-27: 200 mg via INTRAVENOUS

## 2021-09-27 MED ORDER — ONDANSETRON HCL 4 MG PO TABS: 4.0000 mg | ORAL_TABLET | Freq: Four times a day (QID) | ORAL | Status: DC | PRN

## 2021-09-27 MED ORDER — VANCOMYCIN HCL 1000 MG IV SOLR
INTRAVENOUS | Status: AC
  Filled 2021-09-27: qty 20

## 2021-09-27 MED ORDER — FENTANYL CITRATE (PF) 100 MCG/2ML IJ SOLN
25.0000 ug | INTRAMUSCULAR | Status: AC | PRN
  Administered 2021-09-27 (×6): 25 ug via INTRAVENOUS

## 2021-09-27 MED ORDER — METOCLOPRAMIDE HCL 5 MG/ML IJ SOLN: 5.0000 mg | Freq: Three times a day (TID) | INTRAMUSCULAR | Status: DC | PRN

## 2021-09-27 MED ORDER — TRANEXAMIC ACID-NACL 1000-0.7 MG/100ML-% IV SOLN
INTRAVENOUS | Status: AC
  Administered 2021-09-27: 18:00:00 1000 mg via INTRAVENOUS
  Filled 2021-09-27: qty 100

## 2021-09-27 MED ORDER — LIDOCAINE HCL (PF) 2 % IJ SOLN
INTRAMUSCULAR | Status: AC
  Filled 2021-09-27: qty 5

## 2021-09-27 MED ORDER — ONDANSETRON HCL 4 MG/2ML IJ SOLN: 4.0000 mg | Freq: Once | INTRAMUSCULAR | Status: DC | PRN

## 2021-09-27 MED ORDER — SEVOFLURANE IN SOLN
RESPIRATORY_TRACT | Status: AC
  Filled 2021-09-27: qty 250

## 2021-09-27 MED ORDER — CYCLOBENZAPRINE HCL 10 MG PO TABS
5.0000 mg | ORAL_TABLET | Freq: Three times a day (TID) | ORAL | Status: DC | PRN
  Administered 2021-09-27 – 2021-09-28 (×2): 5 mg via ORAL
  Filled 2021-09-27 (×3): qty 1

## 2021-09-27 MED ORDER — OXYCODONE HCL 5 MG PO TABS
ORAL_TABLET | ORAL | Status: AC
  Filled 2021-09-27: qty 2

## 2021-09-27 MED ORDER — DEXMEDETOMIDINE HCL IN NACL 200 MCG/50ML IV SOLN
INTRAVENOUS | Status: AC
  Filled 2021-09-27: qty 50

## 2021-09-27 MED ORDER — LACTATED RINGERS IV SOLN: INTRAVENOUS | Status: DC

## 2021-09-27 MED ORDER — DILTIAZEM HCL ER COATED BEADS 180 MG PO CP24
180.0000 mg | ORAL_CAPSULE | Freq: Two times a day (BID) | ORAL | Status: DC
  Administered 2021-09-28: 180 mg via ORAL
  Filled 2021-09-27 (×2): qty 1

## 2021-09-27 MED ORDER — DILTIAZEM HCL 30 MG PO TABS: 30.0000 mg | ORAL_TABLET | Freq: Two times a day (BID) | ORAL | Status: DC | PRN

## 2021-09-27 MED ORDER — ROCURONIUM BROMIDE 100 MG/10ML IV SOLN
INTRAVENOUS | Status: DC | PRN
  Administered 2021-09-27: 50 mg via INTRAVENOUS
  Administered 2021-09-27 (×2): 10 mg via INTRAVENOUS
  Administered 2021-09-27: 20 mg via INTRAVENOUS

## 2021-09-27 MED ORDER — OXYCODONE HCL 5 MG PO TABS
10.0000 mg | ORAL_TABLET | Freq: Once | ORAL | Status: AC
  Administered 2021-09-27: 18:00:00 10 mg via ORAL

## 2021-09-27 MED ORDER — TRANEXAMIC ACID-NACL 1000-0.7 MG/100ML-% IV SOLN: 1000.0000 mg | Freq: Once | INTRAVENOUS | Status: AC

## 2021-09-27 MED ORDER — HYDROMORPHONE HCL 1 MG/ML IJ SOLN
INTRAMUSCULAR | Status: AC
  Filled 2021-09-27: qty 1

## 2021-09-27 MED ORDER — MIDAZOLAM HCL 2 MG/2ML IJ SOLN
INTRAMUSCULAR | Status: AC
  Filled 2021-09-27: qty 2

## 2021-09-27 MED ORDER — SUCCINYLCHOLINE CHLORIDE 200 MG/10ML IV SOSY
PREFILLED_SYRINGE | INTRAVENOUS | Status: DC | PRN
  Administered 2021-09-27: 100 mg via INTRAVENOUS

## 2021-09-27 MED ORDER — CEFAZOLIN SODIUM-DEXTROSE 2-4 GM/100ML-% IV SOLN
2.0000 g | INTRAVENOUS | Status: AC
  Administered 2021-09-27: 13:00:00 2 g via INTRAVENOUS

## 2021-09-27 MED ORDER — BUPIVACAINE LIPOSOME 1.3 % IJ SUSP
INTRAMUSCULAR | Status: AC
  Filled 2021-09-27: qty 20

## 2021-09-27 MED ORDER — HYDROMORPHONE HCL 1 MG/ML IJ SOLN
0.2000 mg | INTRAMUSCULAR | Status: DC | PRN
  Administered 2021-09-27 – 2021-09-28 (×3): 0.4 mg via INTRAVENOUS
  Filled 2021-09-27 (×3): qty 1

## 2021-09-27 MED ORDER — KETAMINE HCL 10 MG/ML IJ SOLN
INTRAMUSCULAR | Status: DC | PRN
  Administered 2021-09-27 (×3): 10 mg via INTRAVENOUS
  Administered 2021-09-27: 20 mg via INTRAVENOUS

## 2021-09-27 MED ORDER — TRAZODONE HCL 50 MG PO TABS
50.0000 mg | ORAL_TABLET | Freq: Every evening | ORAL | Status: DC | PRN
  Administered 2021-09-27: 50 mg via ORAL
  Filled 2021-09-27: qty 1

## 2021-09-27 MED ORDER — KETAMINE HCL 50 MG/5ML IJ SOSY
PREFILLED_SYRINGE | INTRAMUSCULAR | Status: AC
  Filled 2021-09-27: qty 5

## 2021-09-27 MED ORDER — DEXMEDETOMIDINE (PRECEDEX) IN NS 20 MCG/5ML (4 MCG/ML) IV SYRINGE
PREFILLED_SYRINGE | INTRAVENOUS | Status: DC | PRN
Start: 2021-09-27 — End: 2021-09-27
  Administered 2021-09-27 (×6): 8 ug via INTRAVENOUS

## 2021-09-27 MED ORDER — VANCOMYCIN HCL 1 G IV SOLR
INTRAVENOUS | Status: DC | PRN
  Administered 2021-09-27 (×2): 1000 mg via TOPICAL

## 2021-09-27 MED ORDER — ALUM & MAG HYDROXIDE-SIMETH 200-200-20 MG/5ML PO SUSP: 30.0000 mL | ORAL | Status: DC | PRN

## 2021-09-27 MED ORDER — MIDAZOLAM HCL 2 MG/2ML IJ SOLN
INTRAMUSCULAR | Status: DC | PRN
  Administered 2021-09-27: 2 mg via INTRAVENOUS

## 2021-09-27 MED ORDER — OXYCODONE HCL 5 MG PO TABS
10.0000 mg | ORAL_TABLET | ORAL | Status: DC | PRN
  Administered 2021-09-27 – 2021-09-28 (×3): 15 mg via ORAL
  Filled 2021-09-27 (×3): qty 3

## 2021-09-27 MED ORDER — FAMOTIDINE 20 MG PO TABS
ORAL_TABLET | ORAL | Status: AC
  Administered 2021-09-27: 11:00:00 20 mg via ORAL
  Filled 2021-09-27: qty 1

## 2021-09-27 MED ORDER — HYDROMORPHONE HCL 1 MG/ML IJ SOLN: 2.0000 mg | INTRAMUSCULAR | Status: DC | PRN

## 2021-09-27 MED ORDER — ONDANSETRON HCL 4 MG/2ML IJ SOLN
INTRAMUSCULAR | Status: DC | PRN
  Administered 2021-09-27: 4 mg via INTRAVENOUS

## 2021-09-27 MED ORDER — SENNOSIDES-DOCUSATE SODIUM 8.6-50 MG PO TABS: 1.0000 | ORAL_TABLET | Freq: Every evening | ORAL | Status: DC | PRN

## 2021-09-27 MED ORDER — CHLORHEXIDINE GLUCONATE 0.12 % MT SOLN
OROMUCOSAL | Status: AC
Start: 2021-09-27 — End: 2021-09-27
  Administered 2021-09-27: 11:00:00 15 mL via OROMUCOSAL
  Filled 2021-09-27: qty 15

## 2021-09-27 MED ORDER — ACETAMINOPHEN 10 MG/ML IV SOLN
INTRAVENOUS | Status: DC | PRN
Start: 2021-09-27 — End: 2021-09-27
  Administered 2021-09-27: 1000 mg via INTRAVENOUS

## 2021-09-27 SURGICAL SUPPLY — 79 items
2.0 mm K-wire ×3 IMPLANT
2.5mm Drill ×1 IMPLANT
ANCH SUT .5 CRC TPR CT 40X40 (SUTURE) ×1
APL PRP STRL LF DISP 70% ISPRP (MISCELLANEOUS) ×2
BIT DRILL 2.5 SPS ORTHOLOC (BIT) ×1 IMPLANT
CHLORAPREP W/TINT 26 (MISCELLANEOUS) ×3 IMPLANT
CNTNR SPEC 2.5X3XGRAD LEK (MISCELLANEOUS) ×1
CONT SPEC 4OZ STER OR WHT (MISCELLANEOUS) ×1
CONT SPEC 4OZ STRL OR WHT (MISCELLANEOUS) ×1
CONTAINER SPEC 2.5X3XGRAD LEK (MISCELLANEOUS) ×1 IMPLANT
COOLER POLAR GLACIER W/PUMP (MISCELLANEOUS) ×2 IMPLANT
DRAPE 3/4 80X56 (DRAPES) ×4 IMPLANT
DRAPE C-ARM 42X72 X-RAY (DRAPES) ×4 IMPLANT
DRAPE C-ARM XRAY 36X54 (DRAPES) ×2 IMPLANT
DRAPE INCISE IOBAN 66X45 STRL (DRAPES) ×4 IMPLANT
DRAPE ORTHO SPLIT 77X108 STRL (DRAPES) ×4
DRAPE SURG ORHT 6 SPLT 77X108 (DRAPES) ×2 IMPLANT
DRAPE U-SHAPE 47X51 STRL (DRAPES) ×2 IMPLANT
DRSG OPSITE POSTOP 4X10 (GAUZE/BANDAGES/DRESSINGS) ×1 IMPLANT
DRSG TEGADERM 2-3/8X2-3/4 SM (GAUZE/BANDAGES/DRESSINGS) ×1 IMPLANT
ELECT CAUTERY BLADE 6.4 (BLADE) ×2 IMPLANT
ELECT REM PT RETURN 9FT ADLT (ELECTROSURGICAL) ×2
ELECTRODE REM PT RTRN 9FT ADLT (ELECTROSURGICAL) ×1 IMPLANT
GAUZE XEROFORM 1X8 LF (GAUZE/BANDAGES/DRESSINGS) ×1 IMPLANT
GLOVE SRG 8 PF TXTR STRL LF DI (GLOVE) IMPLANT
GLOVE SURG ORTHO LTX SZ8 (GLOVE) ×4 IMPLANT
GLOVE SURG UNDER POLY LF SZ8 (GLOVE)
GOWN STRL REUS W/ TWL LRG LVL3 (GOWN DISPOSABLE) ×1 IMPLANT
GOWN STRL REUS W/ TWL XL LVL3 (GOWN DISPOSABLE) ×1 IMPLANT
GOWN STRL REUS W/TWL LRG LVL3 (GOWN DISPOSABLE) ×2
GOWN STRL REUS W/TWL XL LVL3 (GOWN DISPOSABLE) ×2
HEMOVAC 400CC 10FR (MISCELLANEOUS) ×1 IMPLANT
K-WIRE 1.6 (WIRE) ×4
K-WIRE FX5X1.6XNS BN SS (WIRE) ×2
KIT STABILIZATION SHOULDER (MISCELLANEOUS) ×2 IMPLANT
KIT TURNOVER KIT A (KITS) ×2 IMPLANT
KWIRE FX5X1.6XNS BN SS (WIRE) IMPLANT
MANIFOLD NEPTUNE II (INSTRUMENTS) ×2 IMPLANT
MARKER SKIN DUAL TIP RULER LAB (MISCELLANEOUS) ×2 IMPLANT
MASK FACE SPIDER DISP (MASK) ×2 IMPLANT
MAT ABSORB  FLUID 56X50 GRAY (MISCELLANEOUS) ×2
MAT ABSORB FLUID 56X50 GRAY (MISCELLANEOUS) ×2 IMPLANT
NDL HYPO 25X1 1.5 SAFETY (NEEDLE) ×1 IMPLANT
NDL MAYO CATGUT SZ4 (NEEDLE) ×2 IMPLANT
NDL MAYO CATGUT SZ4 TCR NDL (NEEDLE) IMPLANT
NEEDLE HYPO 25X1 1.5 SAFETY (NEEDLE) ×2 IMPLANT
NS IRRIG 1000ML POUR BTL (IV SOLUTION) ×2 IMPLANT
PACK ARTHROSCOPY SHOULDER (MISCELLANEOUS) ×2 IMPLANT
PAD WRAPON POLAR SHDR XLG (MISCELLANEOUS) IMPLANT
PLATE SPS SS STD 90 3H (Plate) ×1 IMPLANT
RETRIEVER SUT HEWSON (MISCELLANEOUS) ×1 IMPLANT
SCREW NONLOCKING 3.5X40 (Screw) ×1 IMPLANT
SCREW SPS 3.5X42 BLNT TIP LOCK (Screw) ×1 IMPLANT
SCREW SPS 3.5X44 BLNT TIP LOCK (Screw) ×1 IMPLANT
SCREW SPS BLNT TIP LOCK 3.5X30 (Screw) ×1 IMPLANT
SCREW SPS BLNT TIP LOCK 3.5X36 (Screw) ×2 IMPLANT
SCREW SPS BLNT TIP LOCK 3.5X48 (Screw) ×2 IMPLANT
SCREW SPS BLNT TIP LOCK 3.5X52 (Screw) ×1 IMPLANT
SCREW SPS CORT LOCK 3.5X32 (Screw) ×1 IMPLANT
SCREW SPS CORT LOCK 3.5X34 (Screw) ×1 IMPLANT
SCREW SPS NONLOCK 3.5X32 (Screw) ×1 IMPLANT
SLEEVE PROTECTION STRL DISP (MISCELLANEOUS) ×1 IMPLANT
SPONGE DRAIN TRACH 4X4 STRL 2S (GAUZE/BANDAGES/DRESSINGS) ×1 IMPLANT
SPONGE T-LAP 18X18 ~~LOC~~+RFID (SPONGE) ×4 IMPLANT
STAPLER SKIN PROX 35W (STAPLE) IMPLANT
STRAP SAFETY 5IN WIDE (MISCELLANEOUS) ×2 IMPLANT
SUT ETHIBOND #5 BRAIDED 30INL (SUTURE) ×2 IMPLANT
SUT ETHIBOND 5-0 MS/4 CCS GRN (SUTURE) ×2
SUT FIBERWIRE #2 38 BLUE 1/2 (SUTURE) ×10
SUT TICRON 2-0 30IN 311381 (SUTURE) ×3 IMPLANT
SUT VIC AB 0 CT1 36 (SUTURE) ×2 IMPLANT
SUT VIC AB 2-0 CT1 27 (SUTURE) ×4
SUT VIC AB 2-0 CT1 TAPERPNT 27 (SUTURE) IMPLANT
SUT XBRAID 1.4 BLUE (SUTURE) ×1 IMPLANT
SUT XBRAID 1.4 WHITE/BLUE (SUTURE) ×1 IMPLANT
SUTURE ETHBND 5-0 MS/4 CCS GRN (SUTURE) IMPLANT
SUTURE FIBERWR #2 38 BLUE 1/2 (SUTURE) ×5 IMPLANT
WATER STERILE IRR 500ML POUR (IV SOLUTION) ×2 IMPLANT
WRAPON POLAR PAD SHDR XLG (MISCELLANEOUS) ×4

## 2021-09-27 NOTE — Progress Notes (Signed)
Patient awake//alert x4. Pre-op pain right shoulder 9/10, post op pain level 9-10/10. Upset that pain remains, total medication given in pacu:  269mq fentanyl IV  '2mg'$  dilaudid IV '10mg'$  oxycodone po. Flexeril '5mg'$  po arrived to pacu upon transfer to floor, medication given to accepting RN. ?Patient awake/alert upon transfer/arrival to room, pain remains 9/10, reviewed with patient plan of care, post procedure, upset that pain remains.  ?Anesthesia aware of all events.  ?

## 2021-09-27 NOTE — H&P (Signed)
Paper H&P to be scanned into permanent record. H&P reviewed. No significant changes noted.  

## 2021-09-27 NOTE — Anesthesia Preprocedure Evaluation (Addendum)
Anesthesia Evaluation  ?Patient identified by MRN, date of birth, ID band ?Patient awake ? ? ? ?Reviewed: ?Allergy & Precautions, NPO status , Patient's Chart, lab work & pertinent test results ? ?History of Anesthesia Complications ?Negative for: history of anesthetic complications ? ?Airway ?Mallampati: III ? ?TM Distance: >3 FB ?Neck ROM: Full ? ? ? Dental ? ?(+) Chipped, Poor Dentition, Missing ?  ?Pulmonary ?neg shortness of breath, sleep apnea , former smoker,  ?  ? ?+ decreased breath sounds ? ? ? ? ? Cardiovascular ?Exercise Tolerance: Good ?hypertension, Pt. on medications ?+CHF  ?Normal cardiovascular exam+ dysrhythmias Atrial Fibrillation  ? ? ?  ?Neuro/Psych ?PSYCHIATRIC DISORDERS Anxiety Depression  Neuromuscular disease   ? GI/Hepatic ?negative GI ROS, Neg liver ROS,   ?Endo/Other  ?Hyperthyroidism  ? Renal/GU ?negative Renal ROS  ? ?  ?Musculoskeletal ? ?(+) Arthritis ,  ? Abdominal ?(+) + obese,   ?Peds ?negative pediatric ROS ?(+)  Hematology ?negative hematology ROS ?(+)   ?Anesthesia Other Findings ?Patient had baseline weakness in arms and legs that is being evaluated by neurology.  Not thought to be stroke symptoms at this time. ? ?Past Medical History: ?No date: Alcohol abuse ?No date: Atrial fibrillation ([place]) ?No date: CHF (congestive heart failure) ([place]) ?    Comment:  8/18: EF 50-55%, mild LVH, RA/RV- mild dilation ?No date: Depression ?No date: Hypertension ?No date: Tobacco abuse ? ?Past Surgical History: ?No date: ABLATION ?    Comment:  2x ?No date: CARDIOVERSION ?    Comment:  4x ? ?BMI   ? Body Mass Index: 31.93 kg/m?  ?  ? ? Reproductive/Obstetrics ?negative OB ROS ? ?  ? ? ? ? ? ? ? ? ? ? ? ? ? ?  ?  ? ? ? ? ? ?Anesthesia Physical ?Anesthesia Plan ? ?ASA: 3 ? ?Anesthesia Plan: General ETT  ? ?Post-op Pain Management:   ? ?Induction: Intravenous ? ?PONV Risk Score and Plan: Ondansetron, Dexamethasone, Midazolam and Treatment may vary due to age or  medical condition ? ?Airway Management Planned: Oral ETT ? ?Additional Equipment:  ? ?Intra-op Plan:  ? ?Post-operative Plan: Extubation in OR ? ?Informed Consent: I have reviewed the patients History and Physical, chart, labs and discussed the procedure including the risks, benefits and alternatives for the proposed anesthesia with the patient or authorized representative who has indicated his/her understanding and acceptance.  ? ? ? ?Dental Advisory Given ? ?Plan Discussed with: CRNA and Surgeon ? ?Anesthesia Plan Comments: (Patient consented for risks of anesthesia including but not limited to:  ?- adverse reactions to medications ?- damage to eyes, teeth, lips or other oral mucosa ?- nerve damage due to positioning  ?- sore throat or hoarseness ?- Damage to heart, brain, nerves, lungs, other parts of body or loss of life ? ?Patient voiced understanding.)  ? ? ? ? ? ?Anesthesia Quick Evaluation ? ?

## 2021-09-27 NOTE — Anesthesia Procedure Notes (Signed)
Procedure Name: Intubation ?Date/Time: 09/27/2021 1:11 PM ?Performed by: Cammie Sickle, CRNA ?Pre-anesthesia Checklist: Patient identified, Patient being monitored, Timeout performed, Emergency Drugs available and Suction available ?Patient Re-evaluated:Patient Re-evaluated prior to induction ?Oxygen Delivery Method: Circle system utilized ?Preoxygenation: Pre-oxygenation with 100% oxygen ?Induction Type: Rapid sequence ?Laryngoscope Size: 3 and McGraph ?Grade View: Grade I ?Tube type: Oral ?Tube size: 7.5 mm ?Number of attempts: 1 ?Airway Equipment and Method: Stylet ?Placement Confirmation: ETT inserted through vocal cords under direct vision, positive ETCO2 and breath sounds checked- equal and bilateral ?Secured at: 22 cm ?Tube secured with: Tape ?Dental Injury: Teeth and Oropharynx as per pre-operative assessment  ? ? ? ? ?

## 2021-09-27 NOTE — Op Note (Addendum)
Operative Note  ?  ?SURGERY DATE: 09/27/2021 ?  ?PRE-OP DIAGNOSIS:  ?1. Right proximal humerus fracture ?  ?POST-OP DIAGNOSIS:  ?1. Right proximal humerus fracture ?2. Right Biceps tendinopathy ?  ?PROCEDURES:  ?1. ORIF Right  proximal humerus ?2. Right biceps tenodesis ?  ?SURGEON: Cato Mulligan, MD ? ?ASSISTANT: Anitra Lauth, PA  ? ?ANESTHESIA: Gen  ?  ?ESTIMATED BLOOD LOSS: 250cc ?  ?TOTAL IV FLUIDS: see anesthesia record ? ?IMPLANTS: ?[facility] Medical Ortholoc 3 hole proximal humerus locking plate with associated cortical & locking screws in the humeral shaft and locking screws in the humeral head ?  ?INDICATION(S):  Derrick Murphy is a 56 y.o. male who sustained a displaced proximal humerus fracture after a fall.  After discussion of risks, benefits, and alternatives to surgery, the patient elected to proceed with ORIF proximal humerus and biceps tenodesis.  ?  ?OPERATIVE FINDINGS: 3-part proximal humerus fracture, severe biceps tendinopathy ?  ?OPERATIVE REPORT:   ?I identified Derrick Murphy in the pre-operative holding area. Informed consent was obtained and the surgical site was marked. I reviewed the risks and benefits of the proposed surgical intervention and the patient wished to proceed. The patient was transferred to the operative suite and general anesthesia was administered. The patient was placed in the beach chair position with the head of the bed elevated approximately 45 degrees. All down side pressure points were appropriately padded. Appropriate IV antibiotics were administered within 30 minutes before incision. The extremity was then prepped and draped in standard fashion. A time out was performed confirming the correct extremity, correct patient, and correct procedure.  ? ?We used the standard deltopectoral incision from the coracoid to ~12cm distal. We found the cephalic vein and took it laterally. We opened the deltopectoral interval widely and placed retractors under the CA ligament in the  subacromial space and under the deltoid tendon at its insertion. We released the underlying hemorrhagic bursa between these retractors, taking care not to damage the circumflex branch of the axillary nerve.  ? ?We opened the clavipectoral fascia lateral to the conjoint tendon. We gently palpated the axillary nerve and verified its position and continuity on both sides of the humerus with a Tug test. Note, this test was repeated multiple times during the procedure for nerve localization and confirmed to be intact at the end of the case. We cut the falciform ligament at approximately 1 cm of the upper portion of the pectoralis major insertion. Next we unroofed the bicipital groove. The biceps tendon was inflamed and tendinopathic. We proceeded with a soft tissue biceps tenodesis given the pathology of the tendon.  After opening the biceps tendon sheath all the way to the supraglenoid tubercle, we performed a biceps tenodesis with two #2 TiCron sutures to the upper border of the pectoralis major. The proximal portion of the tendon was excised. The floor of the biciptal groove was cleaned and this was used as a landmark for appropriate plate position and reduction.  ? ?Sutures with #5 Ethibond were placed in the subscapularis, supraspinatus, and infraspinatus tendons. A cobb elevator was placed on the medial aspect of the inferior humeral head. A combination of elevating with the Cobb and using the rotator cuff sutures reduced the head out.  A bone hook was placed around the humeral shaft to reduce it to the humeral head.  Fluoroscopy was used to confirm appropriate reduction in orthogonal planes. K-wires were used to hold the reduction. ? ?A [facility] Ortholoc proximal humeral locking  plate was selected and position was confirmed radiographically. A cortical screw was placed in the oblong hole of the shaft in place.  It was not tightened all the way to avoid over lateralizing the humeral shaft.  Proximal locking  screws were placed.  3 inferior calcar screws were placed. Next, two additional locking screws were placed in the humeral shaft.  The cortical screw in the oblong hole was then replaced with an appropriately sized screw and secured. ? ?Fluoroscopy was used to confirm reduction and hardware position in both the AP and axillary views. Live fluoro was used to confirm that there was no screw penetration into the joint. We then tied the subscapularis sutures into the plate as well as the supraspinatus and infraspinatus sutures into the plate. On range of motion, the humeral head and shaft moved as a unit without excess impingement. The wound was irrigated.  ? ?A Hemovac drain was placed.  Local anesthetic was placed in the deltopectoral interval as well as the subdermal layer of the skin.  We closed the deltopectoral interval with an 0-Vicryl. The skin was closed with 2-0 Vicryl and staples. Xeroform and Honeycomb dressing was applied. A PolarCare unit and sling were placed. Patient was extubated, transferred to a stretcher bed and to the post anesthesia care unit in stable condition.  ? ?Of note, assistance from a PA was essential to performing the surgery.  PA was present for the entire surgery.  PA assisted with patient positioning, retraction, instrumentation, and wound closure. The surgery would have been more difficult and had longer operative time without PA assistance.  ? ?POSTOPERATIVE PLAN: ?The patient will be admitted. Operative arm to remain in sling at all times except RoM exercises and hygiene. 130 FF and 30 ER passive RoM allowed. ASA '325mg'$  x 6 weeks for DVT ppx. Patient to return to clinic in ~2 weeks for post-operative appointment.   ? ?

## 2021-09-27 NOTE — Transfer of Care (Signed)
Immediate Anesthesia Transfer of Care Note ? ?Patient: Derrick Murphy ? ?Procedure(s) Performed: ORIF right proximal humerus, biceps tenodesis (Right) ? ?Patient Location: PACU ? ?Anesthesia Type:General ? ?Level of Consciousness: drowsy ? ?Airway & Oxygen Therapy: Patient Spontanous Breathing and Patient connected to face mask oxygen ? ?Post-op Assessment: Report given to RN and Post -op Vital signs reviewed and stable ? ?Post vital signs: Reviewed and stable ? ?Last Vitals:  ?Vitals Value Taken Time  ?BP 126/68 09/27/21 1715  ?Temp    ?Pulse 64 09/27/21 1716  ?Resp 21 09/27/21 1716  ?SpO2 100 % 09/27/21 1716  ?Vitals shown include unvalidated device data. ? ?Last Pain:  ?Vitals:  ? 09/27/21 1155  ?TempSrc:   ?PainSc: 9   ?   ? ?  ? ?Complications: No notable events documented. ?

## 2021-09-28 ENCOUNTER — Encounter: Payer: Self-pay | Admitting: Orthopedic Surgery

## 2021-09-28 LAB — BASIC METABOLIC PANEL
Anion gap: 5 (ref 5–15)
BUN: 15 mg/dL (ref 6–20)
CO2: 29 mmol/L (ref 22–32)
Calcium: 8.2 mg/dL — ABNORMAL LOW (ref 8.9–10.3)
Chloride: 103 mmol/L (ref 98–111)
Creatinine, Ser: 0.77 mg/dL (ref 0.61–1.24)
GFR, Estimated: >60 mL/min (ref >60)
Glucose, Bld: 147 mg/dL — ABNORMAL HIGH (ref 70–99)
Potassium: 4.4 mmol/L (ref 3.5–5.1)
Sodium: 137 mmol/L (ref 135–145)

## 2021-09-28 LAB — CBC
HCT: 31.2 % — ABNORMAL LOW (ref 39.0–52.0)
Hemoglobin: 10.2 g/dL — ABNORMAL LOW (ref 13.0–17.0)
MCH: 31.4 pg (ref 26.0–34.0)
MCHC: 32.7 g/dL (ref 30.0–36.0)
MCV: 96 fL (ref 80.0–100.0)
Platelets: 330 10*3/uL (ref 150–400)
RBC: 3.25 MIL/uL — ABNORMAL LOW (ref 4.22–5.81)
RDW: 14.8 % (ref 11.5–15.5)
WBC: 18.2 10*3/uL — ABNORMAL HIGH (ref 4.0–10.5)
nRBC: 0 % (ref 0.0–0.2)

## 2021-09-28 MED ORDER — ASPIRIN 325 MG PO TBEC: 325.0000 mg | DELAYED_RELEASE_TABLET | Freq: Every day | ORAL | 0 refills | Status: DC

## 2021-09-28 MED ORDER — ONDANSETRON HCL 4 MG PO TABS: 4.0000 mg | ORAL_TABLET | Freq: Four times a day (QID) | ORAL | 0 refills | Status: DC | PRN

## 2021-09-28 MED ORDER — OXYCODONE HCL 5 MG PO TABS: 5.0000 mg | ORAL_TABLET | ORAL | 0 refills | Status: AC | PRN

## 2021-09-28 NOTE — Discharge Instructions (Signed)
Derrick Mulligan, MD  [facility]  Phone: [phone]  Fax: [phone]   Discharge Instructions after ORIF Proximal Humerus    1. Activity/Sling: You are to be non-weight bearing on operative extremity. A sling/shoulder immobilizer has been provided for you. Only remove the sling to perform elbow, wrist, and hand RoM exercises and hygiene/dressing. Active reaching and lifting are not permitted. You will be given further instructions on sling use at your first physical therapy visit and postoperative visit with Dr. Posey Pronto.   2. Dressings: Dressing may be removed at 1st physical therapy visit (~3-4 days after surgery). Afterwards, you may either leave open to air (if no drainage) or cover with dry, sterile dressing. If you have steri-strips on your wound, please do not remove them. They will fall off on their own. You may shower 5 days after surgery. Please pat incision dry. Do not rub or place any shear forces across incision. If there is drainage or any opening of incision after 5 days, please notify our offices immediately.    3. Driving:  Plan on not driving for six weeks. Please note that you are advised NOT to drive while taking narcotic pain medications as you may be impaired and unsafe to drive.   4. Medications:  - You have been provided a prescription for narcotic pain medicine (usually oxycodone). After surgery, take 1-2 narcotic tablets every 4 hours if needed for severe pain. Please start this as soon as you begin to start having pain (if you received a nerve block, start taking as soon as this wears off).  - A prescription for anti-nausea medication will be provided in case the narcotic medicine causes nausea - take 1 tablet every 6 hours only if nauseated.  - Take enteric coated aspirin 325 mg once daily for 6 weeks to prevent blood clots. Do not take aspirin if you have an aspirin sensitivity/allergy or asthma or are on an anticoagulant (blood thinner) already. If so, then your  home anticoagulant will be resume and managed - do not take aspirin. -Take tylenol '1000mg'$  (2 Extra strength or 3 regular strength tablets) every 8 hours for pain. This will reduce the amount of narcotic medication needed. May stop tylenol when you are having minimal pain. - Take a stool softener (Colace, Dulcolax or Senakot) if you are using narcotic pain medications to help with constipation that is associated with narcotic use. - DO NOT take ANY nonsteroidal anti-inflammatory pain medications: Advil, Motrin, Ibuprofen, Aleve, Naproxen, or Naprosyn.   If you are taking prescription medication for anxiety, depression, insomnia, muscle spasm, chronic pain, or for attention deficit disorder you are advised that you are at a higher risk of adverse effects with use of narcotics post-op, including narcotic addiction/dependence, depressed breathing, death. If you use non-prescribed substances: alcohol, marijuana, cocaine, heroin, methamphetamines, etc., you are at a higher risk of adverse effects with use of narcotics post-op, including narcotic addiction/dependence, depressed breathing, death. You are advised that taking > 50 morphine milligram equivalents (MME) of narcotic pain medication per day results in twice the risk of overdose or death. For your prescription provided: oxycodone 5 mg - taking more than 6 tablets per day after the first few days of surgery.   5. Physical Therapy: 1-2 times per week for ~12 weeks. Therapy typically starts on post operative Day 3 or 4. You have been provided an order for physical therapy. The therapist will provide home exercises. Please contact our offices if this appointment has not been scheduled.  6. Work: May do light duty/desk job in approximately 2 weeks when off of narcotics, pain is well-controlled, and swelling has decreased if able to function with one arm in sling. Full work may take 6 weeks if light motions and function of both arms is required. Lifting  jobs may require 12 weeks.   7. Post-Op Appointments: Your first post-op appointment will be with Dr. Posey Pronto in approximately 2 weeks time.    If you find that they have not been scheduled please call the Orthopaedic Appointment front desk at [phone].                               Proximal Humeral Fracture with Open Reduction Internal Fixation Rehabilitation Framework  The following is a basic framework from which to work during rehabilitation following open reduction and internal fixation of proximal humeral fractures. However, it is critical to communicate with the surgeon in order to be aware of the quality of the bone and fracture repair, any concomitant procedures that might have been performed, etc, that might impact the progression that is appropriate for each specific patient.   Safe Zones established intraoperatively by the surgeon  ? _These ranges can start on Post-op day 1, but may require a few weeks to achieve depending on patient comfort  ? _Passive range of motion limits:  ? _130/30 Program: Max. forward flexion to 130o ; Max. External rotation to 30o  ? _No abduction   If concomitant biceps tenodesis is done with ORIF for proximal humeral fractures, avoid resistance to elbow flexion for 6 weeks, and for the initial couple of weeks, have elbow flexion/extension range of motion be supported by the well arm.   Phase I: Passive Motion - 0-6 weeks post-op  Goals:  ? _PROM - 140/130 degrees of flexion, ER of 40/30 by the end of week 6 (see above)  ? _Decrease pain, Decrease muscle atrophy, Educate regarding joint protection  ? _Provide the patient with instructions for home exercises 3-5 x per day   Precautions:  ? _Stay within intraoperative determined safe zone for motion ? _Sling with abduction pillow at all times, removed only for 3-5x/day exercises, showering, and dressing   Teaching:  ? _Emphasize home, passive well-arm assisted PROM (FF  and ER as above)  ? _Instruct in regular icing techniques or cold therapy device (use as much as possible out of 24 hours for 8-10 days)  ? _Ice packs for 20 - 30 minutes intervals, especially at the end of an exercise session  ? _Monitor for arm, hand, or finger edema  Exercises:  ? _Pendulums _ o With the arm hanging, the patient gently swings the hand forward and backward, then side-to-side, and then clockwise and counterclockwise  ? _Passive, forward flexion, in front of the plane of the scapula as pain allows per safe zone above (140/40 or 130/30): supine well arm, table slides, or table walk back motion all allowed  ? _Passive external rotation with the arm supported in the plane of the scapula: may be supine with cane assistance, seated and supported on arm rest with motion performed by well arm; or propped on counter top and step around  ? _Active scapular retraction, elevation in sitting or standing  ? _Active elbow, wrist, hand ROM - Grasping and gripping lightweight objects  Phase II: Active Range of Motion (6-10 weeks post-op)  Goals:  ? _Full range of motion by end of week 10.  After 6 week physician visit, patient and therapist can move beyond the safe zones as pain allows if radiographic evidence supports sufficient healing.  ? _Emphasis should on range of motion before strengthening.  ? _Improve strength, Decrease pain, Increase functional activities, Scapular stabilization   Precautions:  ? _No sling use  Teaching:  ? _Encourage continued stretching at home. Limited only by pain  ? _Ice after exercise as needed.   Exercises:  ? _Encourage patient to use smooth, normal movement patterns ? _Continue to work on Passive ROM as in Phase I and progress beyond precautionary range limits  ? _Begin AROM and AAROM (using a cane), progressively, to full range of motion when passive motion is normalized - progress active motion to reclined then sitting position  ? _Begin IR with hand  slide up spine, sleeper stretch  ? _Side lying ER against gravity ? _Encourage normal scapular mechanics with active motion  ? _Add Theraband exercises or light dumbbell weights (2lbs) for flexion, extension, external rotation after passive and active motion is restored  ? _Scapulothroacic strengthening (prone extension, prone T, etc)_ ? _Aquatic therapy, if available, can begin no earlier than 1 month post op if wound is completely healed.  o Week 4-6: Stay within established safe zone listed above. Passive motion only  o Week 6 +: Shoulder fully submerged - slow, active motions for flexion, elevation, ER/IR and horizontal abduction/adduction out to scapular plane, range of motion limited by pain only.   Phase III: Final Strengthening - 10+ weeks  Goals:  ? _If acceptable motion has been achieved (>160 FF, >60 ER, IR T12 or above), then Maximize strength--otherwise continue with stretching program  ? _Improve neuromuscular control ? _Increase functional activities _  Precautions:  ? _No sudden, forceful resisted IR (golfing, wood splitting, swimming, etc) until >3 months postop  Teaching:  ? _Continue home stretching minimum 1x per day to maintain full range of motion   Exercises:  ? _Continue to increase difficulty of theraband and dumbbell exerscises as tolerated ? _Increase resistance exercises - must be light enough weight that >20 reps are achieved per set  ? _Continue aerobic training as tolerated, and modalities as appropriate  ? _Continue to progress home program NOTES:  1. With proper exercise, motion, strength, and function continue to improve even after one year.  2. The therapy plan above only serves as a guide. Please be aware of specific individualized patient instructions as written on the prescription or through discussions with the surgeon.  3. The patients Home exercise stretching program (critical for first 10 weeks) is attached.                                             Home Exercises  Perform passive, assisted forward flexion and external rotation (outward turning) exercises with the operative arm. You were taught these exercises prior to discharge. Both exercises should be done with the non-operative arm used as the "therapist arm" while the operative arm remains completely relaxed.   10 of each exercise should be done 5 times daily, work up to the max degrees   Forward Flexion  Maximum: ____130__ deg.  Lie flat on your back, completely relax your operative arm like a wet noodle, and grasp the wrist of the operative shoulder with your opposite hand. Using the power in your opposite arm, bring the stiff arm up  only to the maximum indicated above (90 degrees indicates your arm pointed straight ahead). Start holding it for ten seconds and then work up to where you can hold it for a count of 30. Breathe slowly and deeply while the arm is moved. Repeat this stretch ten times.    External rotation Maximum: __30____ deg.  External rotation is turning the arm out to the side while your elbow stays close to your body. It is best stretched while you are lying on your back. Hold a cane, yardstick, broom handle, or golf club in both hands. Bend both elbows to a right angle. With your operative arm completely relaxed, use steady, gentle force from your normal arm to rotate the hand of the stiff shoulder out away from your body. Continue the rotation only to the maximum indicated above (90 degrees indicates your arm pointed straight ahead). Holding it there for a count  of 10. Repeat this exercise ten times.

## 2021-09-28 NOTE — Evaluation (Signed)
Physical Therapy Evaluation ?Patient Details ?Name: Derrick Murphy ?MRN: [id number] ?DOB: 08-Dec-1965 ?Today's Date: 09/28/2021 ? ?History of Present Illness ? Pt is a 56 y.o. male s/p ORIF R proximal humerus fx and R biceps tenodesis 09/27/21 secondary to R proximal humerus fx and R biceps tendinopathy.  PMH includes sleep apnea, htn, CHF, a-fib, anxiety, baseline weakness in arms and legs, alcohol abuse, cardioversion, and ablation.  ?Clinical Impression ? Prior to surgery, pt was independent with functional mobility; lives alone in 1 level home with 2 STE B railings; pt reports having friends that can assist upon hospital discharge.  Currently pt is modified independent with bed mobility; independent with transfers; independent with ambulation 200 feet (no AD use); and modified independent navigating 4 steps with railing.  Pt steady and safe with all functional mobility during sessions activities and pt reports no questions or concerns for home discharge today.  Pt would benefit from skilled PT to address noted impairments and functional limitations (see below for any additional details).  Upon hospital discharge, pt would benefit from continued therapy for his R UE per physician's recommendations (for discharge plan and follow up therapies).   ? ?Recommendations for follow up therapy are one component of a multi-disciplinary discharge planning process, led by the attending physician.  Recommendations may be updated based on patient status, additional functional criteria and insurance authorization. ? ?Follow Up Recommendations Follow physician's recommendations for discharge plan and follow up therapies ? ?  ?Assistance Recommended at Discharge PRN  ?Patient can return home with the following ? Assistance with cooking/housework;Assist for transportation ? ?  ?Equipment Recommendations None recommended by PT  ?Recommendations for Other Services ?    ?  ?Functional Status Assessment Patient has had a recent decline in  their functional status and demonstrates the ability to make significant improvements in function in a reasonable and predictable amount of time.  ? ?  ?Precautions / Restrictions Precautions ?Precautions: Fall;Shoulder ?Shoulder Interventions: Shoulder sling/immobilizer ?Restrictions ?Weight Bearing Restrictions: Yes ?RUE Weight Bearing: Non weight bearing ?Other Position/Activity Restrictions: PROM permissive to: 140FF, 40ER. No abduction, no AROM, able to do pendulums.  ? ?  ? ?Mobility ? Bed Mobility ?Overal bed mobility: Modified Independent ?Bed Mobility: Supine to Sit, Sit to Supine ?  ?  ?Supine to sit: HOB elevated ?Sit to supine: HOB elevated ?  ?General bed mobility comments: no difficulties noted (pt has been sleeping on couch with multiple pillows to elevate trunk/head) ?  ? ?Transfers ?Overall transfer level: Independent ?Equipment used: None ?  ?  ?  ?  ?  ?  ?  ?General transfer comment: steady safe transfers noted ?  ? ?Ambulation/Gait ?Ambulation/Gait assistance: Independent ?Gait Distance (Feet): 200 Feet ?Assistive device: None ?Gait Pattern/deviations: WFL(Within Functional Limits) ?Gait velocity: normal ?  ?  ?General Gait Details: steady safe ambulation noted ? ?Stairs ?Stairs: Yes ?Stairs assistance: Modified independent (Device/Increase time) ?Stair Management: One rail Left, Alternating pattern, Forwards ?Number of Stairs: 4 ?General stair comments: steady safe stairs navigation ? ?Wheelchair Mobility ?  ? ?Modified Rankin (Stroke Patients Only) ?  ? ?  ? ?Balance Overall balance assessment: Needs assistance ?Sitting-balance support: No upper extremity supported, Feet supported ?Sitting balance-Leahy Scale: Normal ?Sitting balance - Comments: steady sitting reaching outside BOS with L UE ?  ?Standing balance support: No upper extremity supported, During functional activity ?Standing balance-Leahy Scale: Normal ?Standing balance comment: no loss of balance with functional mobility noted  during sessions activities ?  ?  ?  ?  ?  ?  ?  ?  ?  ?  ?  ?   ? ? ? ?  Pertinent Vitals/Pain Pain Assessment ?Pain Assessment: 0-10 ?Pain Score: 7  ?Pain Location: R shoulder ?Pain Descriptors / Indicators: Aching, Sore, Tender ?Pain Intervention(s): Limited activity within patient's tolerance, Monitored during session, Premedicated before session, Repositioned, Other (comment) (polar care applied) ?Vitals (HR and O2 on room air) stable and WFL throughout treatment session.  ? ? ?Home Living Family/patient expects to be discharged to:: Private residence ?Living Arrangements: Alone ?Available Help at Discharge: Friend(s) ?Type of Home: House ?Home Access: Stairs to enter ?Entrance Stairs-Rails: Right;Left ?Entrance Stairs-Number of Steps: 2 ?  ?Home Layout: One level ?Home Equipment: None ?   ?  ?Prior Function Prior Level of Function : Independent/Modified Independent ?  ?  ?  ?  ?  ?  ?Mobility Comments: Was walking 3-4 miles a day prior to injury ?  ?  ? ? ?Hand Dominance  ?   ? ?  ?Extremity/Trunk Assessment  ? Upper Extremity Assessment ?Upper Extremity Assessment: Defer to OT evaluation (R UE in sling) ?  ? ?Lower Extremity Assessment ?Lower Extremity Assessment: LLE deficits/detail (5/5 B LE hip flexion, knee flexion/extension, and DF/PF) ?LLE Deficits / Details: decreased sensation lateral dorsal surface of L foot and dorsal surface of L great toe (pt reports having this issue prior to surgery and is going to doctor for symptoms already) ?  ? ?Cervical / Trunk Assessment ?Cervical / Trunk Assessment: Normal  ?Communication  ? Communication: No difficulties  ?Cognition Arousal/Alertness: Awake/alert ?Behavior During Therapy: [facility] for tasks assessed/performed ?Overall Cognitive Status: Within Functional Limits for tasks assessed ?  ?  ?  ?  ?  ?  ?  ?  ?  ?  ?  ?  ?  ?  ?  ?  ?  ?  ?  ? ?  ?General Comments General comments (skin integrity, edema, etc.): R UE in sling.  Pt agreeable to PT session. ? ?   ?Exercises    ? ?Assessment/Plan  ?  ?PT Assessment Patient needs continued PT services  ?PT Problem List Decreased strength;Decreased range of motion;Decreased mobility;Decreased knowledge of use of DME;Decreased knowledge of precautions;Decreased skin integrity;Pain ? ?   ?  ?PT Treatment Interventions DME instruction;Therapeutic activities;Therapeutic exercise;Patient/family education   ? ?PT Goals (Current goals can be found in the Care Plan section)  ?Acute Rehab PT Goals ?Patient Stated Goal: to go home ?PT Goal Formulation: With patient ?Time For Goal Achievement: 10/12/21 ?Potential to Achieve Goals: Good ? ?  ?Frequency 7X/week ?  ? ? ?Co-evaluation   ?  ?  ?  ?  ? ? ?  ?AM-PAC PT "6 Clicks" Mobility  ?Outcome Measure Help needed turning from your back to your side while in a flat bed without using bedrails?: None ?Help needed moving from lying on your back to sitting on the side of a flat bed without using bedrails?: None ?Help needed moving to and from a bed to a chair (including a wheelchair)?: None ?Help needed standing up from a chair using your arms (e.g., wheelchair or bedside chair)?: None ?Help needed to walk in hospital room?: None ?Help needed climbing 3-5 steps with a railing? : None ?6 Click Score: 24 ? ?  ?End of Session Equipment Utilized During Treatment: Other (comment) (R shoulder sling) ?Activity Tolerance: Patient tolerated treatment well ?Patient left: in chair;with call bell/phone within reach;Other (comment) (polar care in place) ?Nurse Communication: Mobility status;Precautions;Weight bearing status;Other (comment) (pt ready for discharge from PT standpoint) ?PT Visit Diagnosis: Pain;Muscle weakness (generalized) (M62.81) ?Pain -  Right/Left: Right ?Pain - part of body: Shoulder ?  ? ?Time: 1053-1110 ?PT Time Calculation (min) (ACUTE ONLY): 17 min ? ? ?Charges:   PT Evaluation ?$PT Eval Low Complexity: 1 Low ?  ?  ?   ? ?Leitha Bleak, PT ?09/28/21, 12:04 PM ? ? ?

## 2021-09-28 NOTE — Progress Notes (Signed)
?  Subjective: ?1 Day Post-Op Procedure(s) (LRB): ?ORIF right proximal humerus, biceps tenodesis (Right) ?Patient reports pain as moderate to severe.   ?Patient is well, and has had no acute complaints or problems ?Plan is to go Home after hospital stay. ?Negative for chest pain and shortness of breath ?Fever: no ?Gastrointestinal: Negative for nausea and vomiting ? ?Objective: ?Vital signs in last 24 hours: ?Temp:  [97.5 ?F (36.4 ?C)-98.2 ?F (36.8 ?C)] 98.1 ?F (36.7 ?C) (03/10 0324) ?Pulse Rate:  [65-82] 82 (03/10 0324) ?Resp:  [13-19] 18 (03/10 0324) ?BP: (124-150)/(65-87) 143/72 (03/10 0324) ?SpO2:  [98 %-100 %] 100 % (03/10 0324) ?Weight:  [95.3 kg] 95.3 kg (03/09 1052) ? ?Intake/Output from previous day: ? ?Intake/Output Summary (Last 24 hours) at 09/28/2021 0631 ?Last data filed at 09/28/2021 0541 ?Gross per 24 hour  ?Intake 2225 ml  ?Output 1733 ml  ?Net 492 ml  ?  ?Intake/Output this shift: ?Total I/O ?In: -  ?Out: 1405 [Urine:1325; Drains:80] ? ?Labs: ?Recent Labs  ?  09/26/21 ?1015 09/28/21 ?1638  ?HGB 11.2* 10.2*  ? ?Recent Labs  ?  09/26/21 ?1015 09/28/21 ?4536  ?WBC 10.4 18.2*  ?RBC 3.59* 3.25*  ?HCT 35.1* 31.2*  ?PLT 308 330  ? ?Recent Labs  ?  09/26/21 ?1015 09/28/21 ?4680  ?NA 141 137  ?K 4.4 4.4  ?CL 105 103  ?CO2 28 29  ?BUN 24* 15  ?CREATININE 0.89 0.77  ?GLUCOSE 118* 147*  ?CALCIUM 9.1 8.2*  ? ?No results for input(s): LABPT, INR in the last 72 hours. ? ? ?EXAM ?General - Patient is Alert and Oriented ?Extremity - Sensation intact distally ?Dorsiflexion/Plantar flexion intact ?Compartment soft ?Dressing/Incision - clean, dry, with the Hemovac removed ?Motor Function - intact, moving fingers and wrist well on exam.  ? ?Past Medical History:  ?Diagnosis Date  ? Alcohol abuse   ? Atrial fibrillation ([place])   ? CHF (congestive heart failure) ([place])   ? 8/18: EF 50-55%, mild LVH, RA/RV- mild dilation  ? Depression   ? Hypertension   ? Tobacco abuse   ? ? ?Assessment/Plan: ?1 Day Post-Op Procedure(s)  (LRB): ?ORIF right proximal humerus, biceps tenodesis (Right) ?Principal Problem: ?  Proximal humerus fracture ? ?Estimated body mass index is 31.93 kg/m? as calculated from the following: ?  Height as of this encounter: '5\' 8"'$  (1.727 m). ?  Weight as of this encounter: 95.3 kg. ?Advance diet ?Up with therapy ?D/C IV fluids ? ?Plan to discharge home today with outpatient physical therapy. ? ?DVT Prophylaxis - Aspirin ?Weight-Bearing as tolerated to right arm and shoulder sling at all times except bathing and range of motion activities.  ? ?Reche Dixon, PA-C ?Orthopaedic Surgery ?09/28/2021, 6:31 AM ? ?

## 2021-09-28 NOTE — Progress Notes (Signed)
Discharge Note: ?Discharge nurse reviewed discharge instructions with pt. Pt verbalized understanding.pt discharged with polar care and all personal belongings. Staff Wheeled pt out. Pt transported to home via family/friend private vehicle. ?

## 2021-09-28 NOTE — Progress Notes (Signed)
Met with the patient to discuss DC plan and needs ?He lives at home alone, He drives, His Daryel Gerald will pick him up from the hospital, His friends will provide transportation to the doctor, He does not need DME he stated he is independent at home, He is set up with [facility] for [place] ?No additional needs ?

## 2021-09-28 NOTE — Discharge Summary (Signed)
Physician Discharge Summary  ?Subjective: ?1 Day Post-Op Procedure(s) (LRB): ?ORIF right proximal humerus, biceps tenodesis (Right) ?Patient reports pain as moderate.   ?Patient seen in rounds with Dr. Posey Pronto. ?Patient is well, and has had no acute complaints or problems.  Still working on pain control. ?Patient is ready to go home with outpatient physical therapy ? ?Physician Discharge Summary  ?Patient ID: ?Derrick Murphy ?MRN: [id number] ?DOB/AGE: Aug 28, 1965 56 y.o. ? ?Admit date: 09/27/2021 ?Discharge date: 09/28/2021 ? ?Admission Diagnoses: ? ?Discharge Diagnoses:  ?Principal Problem: ?  Proximal humerus fracture ? ? ?Discharged Condition: stable ? ?Hospital Course: The patient is postop day 1 from a right proximal humerus ORIF.  The patient is still working on pain management.  He was uncomfortable throughout the night.  He is ready to go home and is able to take care of himself.  He will do physical therapy this morning.  His vitals have remained stable. ? ?Treatments: surgery:  ?1. ORIF Right  proximal humerus ?2. Right biceps tenodesis ?  ?SURGEON: Cato Mulligan, MD ?  ?ASSISTANT: Anitra Lauth, PA  ?  ?ANESTHESIA: Gen  ?  ?ESTIMATED BLOOD LOSS: 250cc ?  ?TOTAL IV FLUIDS: see anesthesia record ?  ?IMPLANTS: ?[facility] Medical Ortholoc 3 hole proximal humerus locking plate with associated cortical & locking screws in the humeral shaft and locking screws in the humeral head ? ?Discharge Exam: ?Blood pressure (!) 143/72, pulse 82, temperature 98.1 ?F (36.7 ?C), resp. rate 18, height '5\' 8"'$  (1.727 m), weight 95.3 kg, SpO2 100 %. ? ? ?Disposition: Discharge disposition: 01-Home or Self Care ? ? ? ? ? ? ? ?Allergies as of 09/28/2021   ? ?   Reactions  ? Morphine Nausea And Vomiting  ? ?  ? ?  ?Medication List  ?  ? ?STOP taking these medications   ? ?oxyCODONE-acetaminophen 10-325 MG tablet ?Commonly known as: Percocet ?  ? ?  ? ?TAKE these medications   ? ?aspirin 325 MG EC tablet ?Take 1 tablet (325 mg total) by mouth  daily. ?  ?busPIRone 15 MG tablet ?Commonly known as: BUSPAR ?TAKE 1 TABLET BY MOUTH 2 TIMES DAILY. ?  ?cyclobenzaprine 5 MG tablet ?Commonly known as: FLEXERIL ?Take 1 tablet (5 mg total) by mouth 3 (three) times daily as needed for muscle spasms. ?  ?diltiazem 180 MG 24 hr capsule ?Commonly known as: Cardizem CD ?Take 1 capsule (180 mg total) by mouth 2 (two) times daily. ?  ?diltiazem 30 MG tablet ?Commonly known as: Cardizem ?Take 1 tablet (30 mg total) by mouth 2 (two) times daily as needed (Hr greater than 130). ?  ?etodolac 400 MG tablet ?Commonly known as: LODINE ?Take 1 tablet (400 mg total) by mouth 2 (two) times daily. ?  ?ibuprofen 800 MG tablet ?Commonly known as: ADVIL ?Take 1 tablet (800 mg total) by mouth every 8 (eight) hours as needed. ?What changed: reasons to take this ?  ?lisinopril 10 MG tablet ?Commonly known as: ZESTRIL ?Take 1 tablet (10 mg total) by mouth daily. ?  ?ondansetron 4 MG tablet ?Commonly known as: ZOFRAN ?Take 1 tablet (4 mg total) by mouth every 6 (six) hours as needed for nausea. ?  ?oxyCODONE 5 MG immediate release tablet ?Commonly known as: Oxy IR/ROXICODONE ?Take 1-2 tablets (5-10 mg total) by mouth every 4 (four) hours as needed for moderate pain (pain score 4-6). ?  ?traZODone 50 MG tablet ?Commonly known as: DESYREL ?Take 50 mg by mouth at bedtime as needed for  sleep. ?  ? ?  ? ? Follow-up Information   ? ? Leim Fabry, MD Follow up in 2 week(s).   ?Specialty: Orthopedic Surgery ?Why: For postoperative care and staple removal.  X-rays of the right shoulder. ?Contact information: ?[address] ?[place] [address] ?[phone] ? ? ?  ?  ? ?  ?  ? ?  ? ? ?Signed: ?Yosiel Thieme ?09/28/2021, 6:37 AM ? ? ?Objective: ?Vital signs in last 24 hours: ?Temp:  [97.5 ?F (36.4 ?C)-98.2 ?F (36.8 ?C)] 98.1 ?F (36.7 ?C) (03/10 0324) ?Pulse Rate:  [65-82] 82 (03/10 0324) ?Resp:  [13-19] 18 (03/10 0324) ?BP: (124-150)/(65-87) 143/72 (03/10 0324) ?SpO2:  [98 %-100 %] 100 % (03/10  0324) ?Weight:  [95.3 kg] 95.3 kg (03/09 1052) ? ?Intake/Output from previous day: ? ?Intake/Output Summary (Last 24 hours) at 09/28/2021 8469 ?Last data filed at 09/28/2021 0541 ?Gross per 24 hour  ?Intake 2225 ml  ?Output 1733 ml  ?Net 492 ml  ?  ?Intake/Output this shift: ?Total I/O ?In: -  ?Out: 1405 [Urine:1325; Drains:80] ? ?Labs: ?Recent Labs  ?  09/26/21 ?1015 09/28/21 ?6295  ?HGB 11.2* 10.2*  ? ?Recent Labs  ?  09/26/21 ?1015 09/28/21 ?2841  ?WBC 10.4 18.2*  ?RBC 3.59* 3.25*  ?HCT 35.1* 31.2*  ?PLT 308 330  ? ?Recent Labs  ?  09/26/21 ?1015 09/28/21 ?3244  ?NA 141 137  ?K 4.4 4.4  ?CL 105 103  ?CO2 28 29  ?BUN 24* 15  ?CREATININE 0.89 0.77  ?GLUCOSE 118* 147*  ?CALCIUM 9.1 8.2*  ? ?No results for input(s): LABPT, INR in the last 72 hours. ? ?EXAM: ?General - Patient is Alert and Oriented ?Extremity - Neurovascular intact ?Sensation intact distally ?Dorsiflexion/Plantar flexion intact ?Incision - clean, dry, with a Hemovac removed with no complication. ?Motor Function -grip strength intact.  Dorsiflexion and volar flexion of the finger and wrist. ? ?Assessment/Plan: ?1 Day Post-Op Procedure(s) (LRB): ?ORIF right proximal humerus, biceps tenodesis (Right) ?Procedure(s) (LRB): ?ORIF right proximal humerus, biceps tenodesis (Right) ?Past Medical History:  ?Diagnosis Date  ? Alcohol abuse   ? Atrial fibrillation ([place])   ? CHF (congestive heart failure) ([place])   ? 8/18: EF 50-55%, mild LVH, RA/RV- mild dilation  ? Depression   ? Hypertension   ? Tobacco abuse   ? ?Principal Problem: ?  Proximal humerus fracture ? ?Estimated body mass index is 31.93 kg/m? as calculated from the following: ?  Height as of this encounter: '5\' 8"'$  (1.727 m). ?  Weight as of this encounter: 95.3 kg. ?Advance diet ?Up with therapy ?D/C IV fluids ?Diet - Regular diet ?Follow up - in 2 weeks ?Activity -patient to stay in shoulder sling at all times except for bathing and range of motion activities with therapy. ?Disposition - Home ?Condition  Upon Discharge - Stable ?DVT Prophylaxis - Aspirin ? ?Reche Dixon, PA-C ?Orthopaedic Surgery ?09/28/2021, 6:37 AM ? ?

## 2021-09-28 NOTE — Evaluation (Signed)
Occupational Therapy Evaluation Patient Details Name: Derrick Murphy MRN: [id number] DOB: [birth year] Today's Date: 09/28/2021   History of Present Illness Pt is a 56 y.o. male s/p ORIF R proximal humerus fx and R biceps tenodesis 09/27/21 secondary to R proximal humerus fx and R biceps tendinopathy.  PMH includes sleep apnea, htn, CHF, a-fib, anxiety, baseline weakness in arms and legs, alcohol abuse, cardioversion, and ablation.   Clinical Impression   Pt seen for OT evaluation this date in setting of acute hospitalization s/p R ORIF to R proximal humerus. Pt presents this date with c/o 7/10 pain in R UE. He reports living alone in single level apartment and being INDEP at baseline for all I/ADLs and mobility including driving. States he has friends that can help intermittently upon d/c with things like sling mgt and dressing. 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 Pt currently requires: MIN A for UB/LB dressing, MAX A For polar care mgt, MOD A for sling mgt-left abduction pillow portion of sling at home, has not been tolerating well before sx. Pt is INDEP for bed mobility, fxl transfers, and fxl mobility with no AD. Education re: sling and polar care mgt, bathing and dressing modifications/adaptations. Ed re: restrictions/post-op precations. Permissable PROM and pendulums. Pt INDEP with all aspects of mobility. Left seated in recliner end of session with all needs met and in reach. CM and Nurse updated on pt performance/status. Will continue to follow acutely. Recommend HHOT f/u.     Recommendations for follow up therapy are one component of a multi-disciplinary discharge planning process, led by the attending physician.  Recommendations may be updated based on patient status, additional functional criteria and insurance authorization.   Follow Up Recommendations  Home health OT    Assistance  Recommended at Discharge Set up Supervision/Assistance  Patient can return home with the following A little help with bathing/dressing/bathroom;Assistance with cooking/housework;Assist for transportation    Functional Status Assessment  Patient has had a recent decline in their functional status and demonstrates the ability to make significant improvements in function in a reasonable and predictable amount of time.  Equipment Recommendations  None recommended by OT    Recommendations for Other Services       Precautions / Restrictions Precautions Precautions: Fall;Shoulder Shoulder Interventions: Shoulder sling/immobilizer Restrictions Weight Bearing Restrictions: Yes RUE Weight Bearing: Non weight bearing Other Position/Activity Restrictions: PROM permissive to: 140FF, 40ER. No abduction, no AROM, able to do pendulums.      Mobility Bed Mobility Overal bed mobility: Modified Independent                  Transfers Overall transfer level: Independent Equipment used: None                      Balance Overall balance assessment: Modified Independent   Sitting balance-Leahy Scale: Normal       Standing balance-Leahy Scale: Normal                             ADL either performed or assessed with clinical judgement   ADL Overall ADL's : Needs assistance/impaired  General ADL Comments: Requires MIN A for UB/LB dressing, MAX A For polar care mgt, MOD A for sling mgt-left abduction pillow portion of sling at home, has not been tolerating well before sx. Pt is INDEP for bed mobility, fxl transfers, and fxl mobility with no AD. Education re: sling and polar care mgt, bathing and dressing modifications/adaptations. Ed re: restrictions/post-op precations. Permissable PROM and pendulums.     Vision Patient Visual Report: No change from baseline       Perception     Praxis      Pertinent  Vitals/Pain Pain Assessment Pain Assessment: 0-10 Pain Score: 7  Pain Location: R shoulder Pain Descriptors / Indicators: Aching, Sore, Tender Pain Intervention(s): Limited activity within patient's tolerance, Monitored during session, Premedicated before session, Repositioned     Hand Dominance Right   Extremity/Trunk Assessment Upper Extremity Assessment Upper Extremity Assessment: RUE deficits/detail;LUE deficits/detail RUE Deficits / Details: limited ability to even assess grip d/t edema to R hand, pt able to appropriately move digits and sensation in tact. Tolerates shld PROM to ~40 degrees, but painful and OT does not passive range past this point although permitted per physician's orders LUE Deficits / Details: ROM WFL, MMT 5/5   Lower Extremity Assessment Lower Extremity Assessment: Overall WFL for tasks assessed;LLE deficits/detail LLE Deficits / Details: decreased sensation distally/laterally, pt being treated by neurologist OP for this at baseline   Cervical / Trunk Assessment Cervical / Trunk Assessment: Normal   Communication Communication Communication: No difficulties   Cognition Arousal/Alertness: Awake/alert Behavior During Therapy: WFL for tasks assessed/performed Overall Cognitive Status: Within Functional Limits for tasks assessed                                       General Comments  R UE in sling    Exercises Other Exercises Other Exercises: OT ed re: role, polar care and sling mgt, precations   Shoulder Instructions      Home Living Family/patient expects to be discharged to:: Private residence Living Arrangements: Alone Available Help at Discharge: Friend(s);Available PRN/intermittently Type of Home: Apartment Home Access: Stairs to enter Entrance Stairs-Number of Steps: 2 Entrance Stairs-Rails: Right;Left Home Layout: One level               Home Equipment: None          Prior Functioning/Environment Prior Level  of Function : Independent/Modified Independent;Driving             Mobility Comments: not working, currently on disability d/t L4&5 DDD and HF. no AD for fxl mobility. ADLs Comments: INDEP with all ADLs prior ot injury, recently increased difficulty with UB/LB dressing d/t R UE pain        OT Problem List: Decreased strength;Decreased range of motion;Decreased activity tolerance;Pain      OT Treatment/Interventions: Self-care/ADL training;Therapeutic exercise;Therapeutic activities    OT Goals(Current goals can be found in the care plan section) Acute Rehab OT Goals Patient Stated Goal: go home, heal up and maintain as much function in R UE As possible OT Goal Formulation: With patient Time For Goal Achievement: 10/12/21 Potential to Achieve Goals: Good ADL Goals Pt Will Perform Upper Body Dressing: with supervision;with set-up;with adaptive equipment Pt Will Perform Lower Body Dressing: with set-up;with supervision;with adaptive equipment Additional ADL Goal #1: demo understanding of permissible PROM And pendulums w/ no cues  OT Frequency: Min 2X/week    Co-evaluation  AM-PAC OT "6 Clicks" Daily Activity     Outcome Measure Help from another person eating meals?: None Help from another person taking care of personal grooming?: A Little Help from another person toileting, which includes using toliet, bedpan, or urinal?: A Little Help from another person bathing (including washing, rinsing, drying)?: A Little Help from another person to put on and taking off regular upper body clothing?: A Little Help from another person to put on and taking off regular lower body clothing?: A Little 6 Click Score: 19   End of Session Nurse Communication: Mobility status  Activity Tolerance: Patient tolerated treatment well Patient left: in chair;with call bell/phone within reach  OT Visit Diagnosis: History of falling (Z91.81);Pain Pain - Right/Left: Right Pain - part  of body: Shoulder                Time: 6979-4801 OT Time Calculation (min): 73 min Charges:  OT General Charges $OT Visit: 1 Visit OT Evaluation $OT Eval Moderate Complexity: 1 Mod OT Treatments $Self Care/Home Management : 38-52 mins $Therapeutic Activity: 23-37 mins  Gerrianne Scale, MS, OTR/L ascom [phone] 09/28/21, 12:39 PM

## 2021-09-28 NOTE — Plan of Care (Signed)
?  Problem: Education: ?Goal: Knowledge of General Education information will improve ?Description: Including pain rating scale, medication(s)/side effects and non-pharmacologic comfort measures ?Outcome: Progressing ?  ?Problem: Health Behavior/Discharge Planning: ?Goal: Ability to manage health-related needs will improve ?Outcome: Progressing ?  ?Problem: Clinical Measurements: ?Goal: Ability to maintain clinical measurements within normal limits will improve ?Outcome: Progressing ?Goal: Will remain free from infection ?Outcome: Progressing ?Goal: Diagnostic test results will improve ?Outcome: Progressing ?Goal: Respiratory complications will improve ?Outcome: Progressing ?  ?Problem: Activity: ?Goal: Risk for activity intolerance will decrease ?Outcome: Progressing ?  ?Problem: Nutrition: ?Goal: Adequate nutrition will be maintained ?Outcome: Progressing ?  ?Problem: Elimination: ?Goal: Will not experience complications related to bowel motility ?Outcome: Progressing ?Goal: Will not experience complications related to urinary retention ?Outcome: Progressing ?  ?Problem: Pain Managment: ?Goal: General experience of comfort will improve ?Outcome: Progressing ?  ?Problem: Safety: ?Goal: Ability to remain free from injury will improve ?Outcome: Progressing ?  ?Problem: Skin Integrity: ?Goal: Risk for impaired skin integrity will decrease ?Outcome: Progressing ?  ?

## 2021-09-28 NOTE — Anesthesia Postprocedure Evaluation (Signed)
Anesthesia Post Note ? ?Patient: Derrick Murphy ? ?Procedure(s) Performed: ORIF right proximal humerus, biceps tenodesis (Right) ? ?Patient location during evaluation: PACU ?Anesthesia Type: General ?Level of consciousness: awake and alert ?Pain management: pain level controlled ?Vital Signs Assessment: post-procedure vital signs reviewed and stable ?Respiratory status: spontaneous breathing, nonlabored ventilation, respiratory function stable and patient connected to nasal cannula oxygen ?Cardiovascular status: blood pressure returned to baseline and stable ?Postop Assessment: no apparent nausea or vomiting ?Anesthetic complications: no ? ? ?No notable events documented. ? ? ?Last Vitals:  ?Vitals:  ? 09/27/21 2029 09/28/21 0324  ?BP: (!) 150/77 (!) 143/72  ?Pulse: 82 82  ?Resp: 18 18  ?Temp: 36.7 ?C 36.7 ?C  ?SpO2: 100% 100%  ?  ?Last Pain:  ?Vitals:  ? 09/28/21 0537  ?TempSrc:   ?PainSc: 8   ? ? ?  ?  ?  ?  ?  ?  ? ?Precious Haws Sherlon Nied ? ? ? ? ?

## 2021-09-29 ENCOUNTER — Emergency Department
Admission: EM | Admit: 2021-09-29 | Discharge: 2021-09-29 | Disposition: A | Discharge status: Home / Self Care | Payer: 59 | Attending: Emergency Medicine | Admitting: Emergency Medicine

## 2021-09-29 ENCOUNTER — Other Ambulatory Visit: Payer: Self-pay

## 2021-09-29 DIAGNOSIS — Z4889 Encounter for other specified surgical aftercare: Secondary | ICD-10-CM | POA: Insufficient documentation

## 2021-09-29 DIAGNOSIS — M96830 Postprocedural hemorrhage and hematoma of a musculoskeletal structure following a musculoskeletal system procedure: Secondary | ICD-10-CM | POA: Insufficient documentation

## 2021-09-29 DIAGNOSIS — Z5189 Encounter for other specified aftercare: Secondary | ICD-10-CM

## 2021-09-29 DIAGNOSIS — D649 Anemia, unspecified: Secondary | ICD-10-CM | POA: Insufficient documentation

## 2021-09-29 LAB — CBC WITH DIFFERENTIAL/PLATELET
Abs Immature Granulocytes: 0.11 10*3/uL — ABNORMAL HIGH (ref 0.00–0.07)
Basophils Absolute: 0 10*3/uL (ref 0.0–0.1)
Basophils Relative: 0 %
Eosinophils Absolute: 0 10*3/uL (ref 0.0–0.5)
Eosinophils Relative: 0 %
HCT: 28.6 % — ABNORMAL LOW (ref 39.0–52.0)
Hemoglobin: 9.2 g/dL — ABNORMAL LOW (ref 13.0–17.0)
Immature Granulocytes: 1 %
Lymphocytes Relative: 17 %
Lymphs Abs: 1.9 10*3/uL (ref 0.7–4.0)
MCH: 31.2 pg (ref 26.0–34.0)
MCHC: 32.2 g/dL (ref 30.0–36.0)
MCV: 96.9 fL (ref 80.0–100.0)
Monocytes Absolute: 1 10*3/uL (ref 0.1–1.0)
Monocytes Relative: 9 %
Neutro Abs: 8.3 10*3/uL — ABNORMAL HIGH (ref 1.7–7.7)
Neutrophils Relative %: 73 %
Platelets: 242 10*3/uL (ref 150–400)
RBC: 2.95 MIL/uL — ABNORMAL LOW (ref 4.22–5.81)
RDW: 15.4 % (ref 11.5–15.5)
WBC: 11.4 10*3/uL — ABNORMAL HIGH (ref 4.0–10.5)
nRBC: 0 % (ref 0.0–0.2)

## 2021-09-29 NOTE — Discharge Instructions (Signed)
Your hemoglobin has slightly gone down to 9.1 but I discussed with the surgery team who stated that this can be continued to trend down a little bit after surgery however if you start developing weakness, shortness of breath, extreme fatigue then please return to the ER and we will recheck your hemoglobin  Otherwise they are going to try to get follow-up for Monday.  If you have any other issues please return to the ER ?

## 2021-09-29 NOTE — ED Notes (Signed)
Pt unable to sign for discharge d/t being R handed and R arm still in sling from surgery ?

## 2021-09-29 NOTE — ED Triage Notes (Signed)
Pt arrives via EMS from home for bleeding from his surgical site- pt has an operation on his R humerus- bleeding started about an hour ago- pt does not taken blood thinners- VSS per EMS ?

## 2021-09-29 NOTE — ED Provider Notes (Signed)
? ?[facility] ?Provider Note ? ? ? Event Date/Time  ? First MD Initiated Contact with Patient 09/29/21 1403   ?  (approximate) ? ? ?History  ? ?Post-op Problem ? ? ?HPI ? ?Derrick Murphy is a 56 y.o. male who was discharged yesterday after having surgery on his right humerus she comes in with concern for postop bleeding.  Has some mild venous bleeding at initial surgical site on the upper arm that looks more just like a puncture wound.  He reports the bleeding started about an hour ago.  Denies any blood thinners he wanted to come in to get it rechecked out.  He denies any other concerning symptoms  ? ? ?Physical Exam  ? ?Triage Vital Signs: ?ED Triage Vitals  ?Enc Vitals Group  ?   BP 09/29/21 1401 139/66  ?   Pulse Rate 09/29/21 1401 90  ?   Resp 09/29/21 1401 18  ?   Temp 09/29/21 1401 99.2 ?F (37.3 ?C)  ?   Temp Source 09/29/21 1401 Oral  ?   SpO2 09/29/21 1401 97 %  ?   Weight 09/29/21 1359 215 lb (97.5 kg)  ?   Height 09/29/21 1359 '5\' 8"'$  (1.727 m)  ?   Head Circumference --   ?   Peak Flow --   ?   Pain Score 09/29/21 1358 7  ?   Pain Loc --   ?   Pain Edu? --   ?   Excl. in [place]? --   ? ? ?Most recent vital signs: ?Vitals:  ? 09/29/21 1401  ?BP: 139/66  ?Pulse: 90  ?Resp: 18  ?Temp: 99.2 ?F (37.3 ?C)  ?SpO2: 97%  ? ? ? ?General: Awake, no distress.  ?CV:  Good peripheral perfusion.  ?Resp:  Normal effort.  ?Abd:  No distention.  ?Other:  R arm in sling.  See media tab.  Small puncture like wound beneath the actual area related to surgery on with small amount of venous oozing out. Bruising and swelling in hand with good radial pulse. Sensation intact.  ? ? ?ED Results / Procedures / Treatments  ? ?Labs ?(all labs ordered are listed, but only abnormal results are displayed) ?Labs Reviewed  ?CBC WITH DIFFERENTIAL/PLATELET - Abnormal; Notable for the following components:  ?    Result Value  ? WBC 11.4 (*)   ? RBC 2.95 (*)   ? Hemoglobin 9.2 (*)   ? HCT 28.6 (*)   ? Neutro Abs 8.3 (*)   ? Abs  Immature Granulocytes 0.11 (*)   ? All other components within normal limits  ? ?PROCEDURES: ? ?Critical Care performed: No ? ?Marland Kitchen.Laceration Repair ? ?Date/Time: 09/29/2021 3:19 PM ?Performed by: Vanessa Hobbs, MD ?Authorized by: Vanessa [place], MD  ? ?Consent:  ?  Consent obtained:  Verbal ?  Consent given by:  Patient ?  Risks discussed:  Infection and pain ?Laceration details:  ?  Location:  Shoulder/arm ?  Length (cm):  0.5 ?Treatment:  ?  Area cleansed with:  Saline ?Skin repair:  ?  Repair method: dermaclip. ?Repair type:  ?  Repair type:  Simple ?Post-procedure details:  ?  Dressing:  Sterile dressing ?  Procedure completion:  Tolerated ? ? ?MEDICATIONS ORDERED IN ED: ?Medications - No data to display ? ? ?IMPRESSION / MDM / ASSESSMENT AND PLAN / ED COURSE  ?I reviewed the triage vital signs and the nursing notes. ?             ?               ? ?  Patient comes in with some postop oozing.  Discussed the case with Dr. Harlow Mares who recommends placing a Steri-Strip or derma clip over it, because and pressure wrapping with [place]an. ? ?I did check a hemoglobin just to ensure that it was not significantly dropping. ? ?Patient's hemoglobin was 11.2 3 days ago 10.2 2 days ago and now is down to 9.2.  Patient is ambulatory without any symptoms of anemia including weakness.  He understands that his hemoglobin has slightly dropped and that if he develops any weakness or concerns he needs to return to the ER immediately for repeat hemoglobin check but at this time given the bleeding has stopped with the compression bandage she feels comfortable going home.  Dr. Harlow Mares area but agree okay with dc home and going to get follow-up for Monday and pt to return if symptoms are worsening or you have any other concerns but at this time he is feels comfortable discharge ? ? ? ? ?FINAL CLINICAL IMPRESSION(S) / ED DIAGNOSES  ? ?Final diagnoses:  ?Visit for wound check  ? ? ? ?Rx / DC Orders  ? ?ED Discharge Orders   ? ? None  ? ?   ? ? ? ?Note:  This document was prepared using Dragon voice recognition software and may include unintentional dictation errors. ?  ?Vanessa [place], MD ?09/29/21 1530 ? ?

## 2021-10-01 ENCOUNTER — Encounter: Payer: 59 | Admitting: Neurology

## 2021-10-01 NOTE — Progress Notes (Signed)
undetermined ?

## 2021-10-02 ENCOUNTER — Other Ambulatory Visit: Payer: Self-pay | Admitting: Orthopedic Surgery

## 2021-10-02 ENCOUNTER — Encounter: Payer: Self-pay | Admitting: Orthopedic Surgery

## 2021-10-02 ENCOUNTER — Ambulatory Visit
Admission: RE | Admit: 2021-10-02 | Discharge: 2021-10-02 | Disposition: A | Discharge status: Home / Self Care | Payer: 59 | Source: Ambulatory Visit | Attending: Orthopedic Surgery | Admitting: Orthopedic Surgery

## 2021-10-02 ENCOUNTER — Other Ambulatory Visit (HOSPITAL_COMMUNITY): Payer: Self-pay | Admitting: Orthopedic Surgery

## 2021-10-02 DIAGNOSIS — I82621 Acute embolism and thrombosis of deep veins of right upper extremity: Secondary | ICD-10-CM

## 2021-10-02 DIAGNOSIS — M792 Neuralgia and neuritis, unspecified: Secondary | ICD-10-CM

## 2021-10-02 DIAGNOSIS — M79601 Pain in right arm: Secondary | ICD-10-CM | POA: Insufficient documentation

## 2021-10-03 ENCOUNTER — Encounter: Payer: 59 | Admitting: Physical Therapy

## 2021-10-03 ENCOUNTER — Other Ambulatory Visit: Payer: Self-pay | Admitting: Orthopedic Surgery

## 2021-10-03 DIAGNOSIS — M792 Neuralgia and neuritis, unspecified: Secondary | ICD-10-CM

## 2021-10-10 ENCOUNTER — Encounter: Payer: 59 | Admitting: Physical Therapy

## 2021-10-15 ENCOUNTER — Ambulatory Visit (INDEPENDENT_AMBULATORY_CARE_PROVIDER_SITE_OTHER): Payer: 59 | Admitting: Neurology

## 2021-10-15 ENCOUNTER — Encounter: Payer: 59 | Admitting: Neurology

## 2021-10-15 DIAGNOSIS — Z0289 Encounter for other administrative examinations: Secondary | ICD-10-CM

## 2021-10-15 DIAGNOSIS — R202 Paresthesia of skin: Secondary | ICD-10-CM | POA: Diagnosis not present

## 2021-10-15 DIAGNOSIS — R200 Anesthesia of skin: Secondary | ICD-10-CM

## 2021-10-15 NOTE — Progress Notes (Signed)
? ? ? ?   ?Full Name: Ralpheal Zappone Gender: Male ?MRN #: [id number] Date of Birth: December 22, 1965 ?   ?Visit Date: 10/15/2021 15:31 ?Age: 56 Years ?Examining Physician: Arlice Colt, MD  ?Referring Physician: Arlice Colt, MD ?   ?History: ?Mr. Riechers is a 56 year old man with paresthesias that are intermittent and involves the left arm more than the right arm or the legs.  Sometimes he has numbness in the face as well. ? ?Nerve conduction studies: ?The left median and ulnar motor responses had normal distal latencies, amplitudes and conduction velocities.  The ulnar F-wave latency was normal.  The left median and ulnar orthodromic sensory responses were normal.  There was borderline antidromic prolonged peak latency with normal amplitude but no prolongation compared to the left ulnar antidromic response. ? ?Electromyography: ?Needle EMG of selected muscles of the left arm was performed.  Motor unit morphology and recruitment was normal.  There was no abnormal spontaneous activity. ? ?Impression: ?This is a borderline normal NCV/EMG study without any definite evidence of mononeuropathy, radiculopathy or myopathy.  The borderline delayed median sensory response across the wrist is unlikely to be clinically significant though a minimal carpal tunnel syndrome cannot be ruled out. ? ? ?Verbal informed consent was obtained from the patient, patient was informed of potential risk of procedure, including bruising, bleeding, hematoma formation, infection, muscle weakness, muscle pain, numbness, among others. ?   ? ?   ?[place] ?   ?Nerve / Sites Muscle Latency Ref. Amplitude Ref. Rel Amp Segments Distance Velocity Ref. Area  ?  ms ms mV mV %  cm m/s m/s mVms  ?L Median - APB  ?   Wrist APB 3.9 ?4.4 8.3 ?4.0 100 Wrist - APB 7   25.6  ?   Upper arm APB 7.7  7.7  92.6 Upper arm - Wrist 20 53 ?49 23.4  ?L Ulnar - ADM  ?   Wrist ADM 3.3 ?3.3 14.7 ?6.0 100 Wrist - ADM 7   46.9  ?   B.Elbow ADM 6.5  12.1  82 B.Elbow - Wrist 17 52 ?49  41.3  ?   A.Elbow ADM 8.5  12.5  103 A.Elbow - B.Elbow 10 51 ?49 44.0  ?       ?[place] ?   ?Nerve / Sites Rec. Site Peak Lat Ref.  Amp Ref. Segments Distance Peak Diff Ref.  ?  ms ms ?V ?V  cm ms ms  ?L Median, Ulnar - Transcarpal comparison  ?   Median Palm Wrist 2.3 ?2.2 77 ?35 Median Palm - Wrist 8    ?   Ulnar Palm Wrist 2.3 ?2.2 21 ?12 Ulnar Palm - Wrist 8    ?      Median Palm - Ulnar Palm  -0.1 ?0.4  ?L Median - Orthodromic (Dig II, Mid palm)  ?   Dig II Wrist 3.1 ?3.4 14 ?10 Dig II - Wrist 13    ?L Ulnar - Orthodromic, (Dig V, Mid palm)  ?   Dig V Wrist 3.1 ?3.1 6 ?5 Dig V - Wrist 11    ?         ?F  Wave ?   ?Nerve F Lat Ref.  ? ms ms  ?L Ulnar - ADM 30.6 ?32.0  ?     ?EMG Summary Table   ? Spontaneous MUAP Recruitment  ?Muscle IA Fib PSW Fasc Other Amp Dur. Poly Pattern  ?L. Deltoid Normal None None None _______ Normal Normal Normal  Normal  ?L. Triceps brachii Normal None None None _______ Normal Normal Normal Normal  ?L. Biceps brachii Normal None None None _______ Normal Normal Normal Normal  ?L. Extensor digitorum communis Normal None None None _______ Normal Normal Normal Normal  ?L. First dorsal interosseous Normal None None None _______ Normal Normal Normal Normal  ? ?  ?

## 2021-10-17 ENCOUNTER — Encounter: Payer: 59 | Admitting: Physical Therapy

## 2021-10-22 ENCOUNTER — Encounter: Payer: 59 | Admitting: Physical Therapy

## 2021-10-24 ENCOUNTER — Encounter: Payer: 59 | Admitting: Neurology

## 2021-10-24 ENCOUNTER — Encounter: Payer: 59 | Admitting: Physical Therapy

## 2021-10-29 ENCOUNTER — Encounter: Payer: 59 | Admitting: Physical Therapy

## 2021-10-29 ENCOUNTER — Telehealth: Payer: Self-pay | Admitting: *Deleted

## 2021-10-29 NOTE — Telephone Encounter (Signed)
Dr. Amalia Hailey patient - ? ?Gout flare, would like refill on gout medication or does he need to come in. Please advise ?

## 2021-10-30 ENCOUNTER — Other Ambulatory Visit: Payer: Self-pay | Admitting: Podiatry

## 2021-10-30 MED ORDER — MELOXICAM 15 MG PO TABS: 15.0000 mg | ORAL_TABLET | Freq: Every day | ORAL | 0 refills | Status: DC

## 2021-10-31 ENCOUNTER — Encounter: Payer: 59 | Admitting: Physical Therapy

## 2021-11-05 ENCOUNTER — Encounter: Payer: 59 | Admitting: Physical Therapy

## 2021-11-07 ENCOUNTER — Encounter: Payer: 59 | Admitting: Physical Therapy

## 2021-11-12 ENCOUNTER — Encounter: Payer: 59 | Admitting: Physical Therapy

## 2021-11-14 ENCOUNTER — Encounter: Payer: 59 | Admitting: Physical Therapy

## 2021-11-21 ENCOUNTER — Other Ambulatory Visit: Payer: Self-pay | Admitting: Physical Medicine & Rehabilitation

## 2021-11-21 DIAGNOSIS — G8929 Other chronic pain: Secondary | ICD-10-CM

## 2021-11-25 ENCOUNTER — Other Ambulatory Visit: Payer: Self-pay | Admitting: Podiatry

## 2021-11-27 NOTE — Telephone Encounter (Signed)
Patient has duplicate therapy with another medication(etodolac) please advise.

## 2021-12-04 ENCOUNTER — Ambulatory Visit
Admission: RE | Admit: 2021-12-04 | Discharge: 2021-12-04 | Disposition: A | Discharge status: Home / Self Care | Payer: 59 | Source: Ambulatory Visit | Attending: Physical Medicine & Rehabilitation | Admitting: Physical Medicine & Rehabilitation

## 2021-12-04 DIAGNOSIS — G8929 Other chronic pain: Secondary | ICD-10-CM | POA: Insufficient documentation

## 2021-12-04 DIAGNOSIS — M5442 Lumbago with sciatica, left side: Secondary | ICD-10-CM | POA: Insufficient documentation

## 2021-12-10 ENCOUNTER — Ambulatory Visit: Payer: Self-pay | Admitting: Urology

## 2022-01-01 ENCOUNTER — Encounter: Payer: Self-pay | Admitting: Podiatry

## 2022-01-01 ENCOUNTER — Ambulatory Visit (INDEPENDENT_AMBULATORY_CARE_PROVIDER_SITE_OTHER): Payer: 59

## 2022-01-01 ENCOUNTER — Other Ambulatory Visit: Payer: Self-pay | Admitting: Internal Medicine

## 2022-01-01 ENCOUNTER — Ambulatory Visit: Payer: Medicare HMO | Admitting: Podiatry

## 2022-01-01 ENCOUNTER — Telehealth: Payer: Self-pay | Admitting: Internal Medicine

## 2022-01-01 DIAGNOSIS — M722 Plantar fascial fibromatosis: Secondary | ICD-10-CM

## 2022-01-01 DIAGNOSIS — S90851A Superficial foreign body, right foot, initial encounter: Secondary | ICD-10-CM

## 2022-01-01 DIAGNOSIS — R52 Pain, unspecified: Secondary | ICD-10-CM

## 2022-01-01 NOTE — Telephone Encounter (Signed)
Patient c/o Palpitations:  High priority if patient c/o lightheadedness, shortness of breath, or chest pain  How long have you had palpitations/irregular HR/ Afib? Are you having the symptoms now? Afib started occurring again in the past couple months, is having symptoms now   Are you currently experiencing lightheadedness, SOB or CP? No   Do you have a history of afib (atrial fibrillation) or irregular heart rhythm? Yes   Have you checked your BP or HR? (document readings if available): today 137/73. Has been ranging 140's/80.   Are you experiencing any other symptoms? No

## 2022-01-01 NOTE — Telephone Encounter (Signed)
*  STAT* If patient is at the pharmacy, call can be transferred to refill team.   1. Which medications need to be refilled? (please list name of each medication and dose if known) diltiazem (CARDIZEM) 30 MG tablet  2. Which pharmacy/location (including street and city if local pharmacy) is medication to be sent to? [id number] [place]. Do they need a 30 day or 90 day supply? 90 day supply    Only has two tablets left.

## 2022-01-01 NOTE — Progress Notes (Signed)
   Subjective: 56 y.o. male presenting to the office today for evaluation of a symptomatic skin lesion to the plantar aspect of the right forefoot.  Patient believes he has a foreign body or splinter in the forefoot.  It has been painful for about 3-4 weeks now.  He cannot recall an incident where he did step on something but he believes he did.  He presents for further treatment and evaluation   Past Medical History:  Diagnosis Date   Alcohol abuse    Atrial fibrillation (HCC)    CHF (congestive heart failure) ([place])    8/18: EF 50-55%, mild LVH, RA/RV- mild dilation   Depression    Hypertension    Tobacco abuse    Past Surgical History:  Procedure Laterality Date   ABLATION     2x   CARDIOVERSION     4x   ORIF HUMERUS FRACTURE Right 09/27/2021   Procedure: ORIF right proximal humerus, biceps tenodesis;  Surgeon: Leim Fabry, MD;  Location: [name];  Service: Orthopedics;  Laterality: Right;   Allergies  Allergen Reactions   Morphine Nausea And Vomiting     Objective:  Physical Exam General: Alert and oriented x3 in no acute distress  Dermatology: Hyperkeratotic lesion(s) present on the plantar aspect of the fourth MTP joint right foot. Pain on palpation with a central nucleated core noted.  Upon debridement there was a small foreign body approximately 1 mm in length within the deeper dermal layers of the skin.  Skin is warm, dry and supple bilateral lower extremities. Negative for open lesions or macerations.  Vascular: Palpable pedal pulses bilaterally. No edema or erythema noted. Capillary refill within normal limits.  Neurological: Epicritic and protective threshold grossly intact bilaterally.   Musculoskeletal Exam: Pain on palpation at the keratotic lesion(s) noted. Range of motion within normal limits bilateral. Muscle strength 5/5 in all groups bilateral.  Assessment: 1.  Foreign body in skin right plantar foot   Plan of Care:  1. Patient evaluated 2.  Excisional removal of the foreign body and hyperkeratotic skin was performed using a chisel blade was performed without incident and the patient tolerated this well.  The patient noticed immediate relief 3. Dressed area with light dressing. 4. Patient is to return to the clinic PRN.   Edrick Kins, DPM [facility]  Dr. Edrick Kins, DPM    [address], [place] [address]                Office [phone]  Fax [phone]

## 2022-01-02 NOTE — Telephone Encounter (Signed)
Called the patient to make him aware of Dr. Olin Pia recommendation. Lmtcb.

## 2022-01-02 NOTE — Telephone Encounter (Addendum)
Spoke with the patient. Patient sts that he has a hx of AFIB and has had 2 AFIB ablations. Pt sts that he normally has "skipped beats" but in the last few months he has had increased episodes of palpitations/AFIB. The episodes last 12-24 hours. He is unable to provide any HRs. His BP has been stable 130-140's/70-80's.  He is not on a NOAC. Patient wonders if he needs to resume a NOAC. Pt denies chest pain, sob, light headiness or dizziness.  Appt scheduled with Dr. Caryl Comes on 01/15/22 @ 11:20am. Adv the patient to contact the office sooner if symptoms worsen. Adv the patient that I will fwd an update to Dr. Caryl Comes in the interim.  Patient agreeable with the poc and voiced appreciation for the call.

## 2022-01-02 NOTE — Telephone Encounter (Signed)
Deboraha Sprang, MD   (1:13 PM)    Lattie Haw can we get him a 7 day zio AT ( not XT monitor) thanks so we have information prior to visit

## 2022-01-02 NOTE — Telephone Encounter (Signed)
Spoke with the patient and made him aware of Dr. Olin Pia recommendation for him to wear a zio AT for 7 days. Patient sts that he would need to know what his out of pocket cost would be before he agrees to proceed. Adv the patient that I would reach out to billing to get some info and we will f/u back up with him. Pt verbalized understanding.

## 2022-01-03 ENCOUNTER — Encounter: Payer: Self-pay | Admitting: Internal Medicine

## 2022-01-04 ENCOUNTER — Other Ambulatory Visit: Payer: Self-pay | Admitting: *Deleted

## 2022-01-04 ENCOUNTER — Encounter: Payer: Self-pay | Admitting: Internal Medicine

## 2022-01-04 MED ORDER — APIXABAN 5 MG PO TABS: 5.0000 mg | ORAL_TABLET | Freq: Two times a day (BID) | ORAL | Status: DC

## 2022-01-04 MED ORDER — DILTIAZEM HCL 30 MG PO TABS: 30.0000 mg | ORAL_TABLET | Freq: Two times a day (BID) | ORAL | 3 refills | Status: DC | PRN

## 2022-01-04 NOTE — Telephone Encounter (Addendum)
Called the patient. Patient made aware of Dr. Olin Pia response and recommendation. Patient sts that he has a full bottle of Eliquis 5 mg on hand. He will resume taking bid. Pt sts that he does not need a new prescription sent in at this time. Pt reports that he is not taking aspirin, meloxicam or Ibuprofen listed on his medication list. Updated the pt med list. Adv him that he should avoid those medications while on Eliquis to avoid an increased risk of bleeding. Patient also sts that he was seen by his Neurologist at [facility] yesterday. He is being worked up for concern of recurring strokes since there were several areas of concern on a recent MRI of his head.  Adv the patient that I did send a message to our billing manager about an estimate on his out of pocket cost for a zio and I am awaiting a response. Adv the patient that he should keep his scheduled appt with Dr. Caryl Comes due to recurring AFIB episodes and the resuming of anticoagulation.  Patient is agreeable and voiced appreciation for the call.

## 2022-01-07 ENCOUNTER — Other Ambulatory Visit: Payer: Self-pay | Admitting: Neurology

## 2022-01-07 DIAGNOSIS — R202 Paresthesia of skin: Secondary | ICD-10-CM

## 2022-01-07 NOTE — Telephone Encounter (Signed)
Refill addressed by Jule Ser, RN on 01/04/22.

## 2022-01-08 ENCOUNTER — Ambulatory Visit: Payer: 59 | Admitting: Podiatry

## 2022-01-09 ENCOUNTER — Encounter: Payer: Self-pay | Admitting: Podiatry

## 2022-01-09 NOTE — Telephone Encounter (Signed)
Please call patient for an appointment either with myself or another doctor hopefully before the end of the week.  Thanks, Dr. Amalia Hailey

## 2022-01-10 NOTE — Telephone Encounter (Signed)
Spoke with patient scheduled for 01/11/22 830a per Estill Bamberg

## 2022-01-11 ENCOUNTER — Ambulatory Visit: Payer: Medicare HMO | Admitting: Podiatry

## 2022-01-11 ENCOUNTER — Telehealth: Payer: Self-pay | Admitting: Cardiovascular Disease

## 2022-01-11 DIAGNOSIS — I4811 Longstanding persistent atrial fibrillation: Secondary | ICD-10-CM

## 2022-01-11 DIAGNOSIS — R52 Pain, unspecified: Secondary | ICD-10-CM | POA: Diagnosis not present

## 2022-01-11 DIAGNOSIS — M7752 Other enthesopathy of left foot: Secondary | ICD-10-CM | POA: Diagnosis not present

## 2022-01-11 MED ORDER — METHYLPREDNISOLONE 4 MG PO TBPK: ORAL_TABLET | ORAL | 0 refills | Status: DC

## 2022-01-11 MED ORDER — BETAMETHASONE SOD PHOS & ACET 6 (3-3) MG/ML IJ SUSP
3.0000 mg | Freq: Once | INTRAMUSCULAR | Status: AC
  Administered 2022-01-11: 3 mg via INTRA_ARTICULAR

## 2022-01-11 NOTE — Telephone Encounter (Signed)
Patient called to say that his insurance has approve him to wear the heart monitor. Please advise

## 2022-01-11 NOTE — Progress Notes (Signed)
   HPI: 56 y.o. male presenting today for new complaint of pain regarding an acute flareup of gout to the left great toe joint.  Patient states of the last 4-5 days he has had redness with swelling and pain to the left great toe joint.  He does have a history of gout.  Patient states that about a week ago he did eat significant portion of bacon which may have elicited the gout.  This is only his second incident of gout  Past Medical History:  Diagnosis Date   Alcohol abuse    Atrial fibrillation (HCC)    CHF (congestive heart failure) (HCC)    8/18: EF 50-55%, mild LVH, RA/RV- mild dilation   Depression    Hypertension    Tobacco abuse     Past Surgical History:  Procedure Laterality Date   ABLATION     2x   CARDIOVERSION     4x   ORIF HUMERUS FRACTURE Right 09/27/2021   Procedure: ORIF right proximal humerus, biceps tenodesis;  Surgeon: Signa Kell, MD;  Location: [name];  Service: Orthopedics;  Laterality: Right;    Allergies  Allergen Reactions   Morphine Nausea And Vomiting     Physical Exam: General: The patient is alert and oriented x3 in no acute distress.  Dermatology: Skin is warm, dry and supple bilateral lower extremities. Negative for open lesions or macerations.  Vascular: Palpable pedal pulses bilaterally. Capillary refill within normal limits.  Erythema with edema noted to the left great toe joint  Neurological: Light touch and protective threshold grossly intact  Musculoskeletal Exam: No pedal deformities noted.  Significant pain on palpation and light touch to the first MTP left   Assessment: 1.  Acute gout/first MTP capsulitis left great toe joint   Plan of Care:  1. Patient evaluated.  2.  Injection of 0.5 cc Celestone Soluspan injected in the second MTP joint left 3.  Prescription for Medrol Dosepak 4.  No NSAIDs prescribed.  Patient is on anticoagulant therapy 5.  Recommend low purine diet 6.  Return to clinic as needed     Felecia Shelling,  DPM [facility]  Dr. Felecia Shelling, DPM    [address] [place], [place] [address]                Office [phone]  Fax [phone]

## 2022-01-11 NOTE — Telephone Encounter (Signed)
I called and spoke with the patient. He advised his insurance is going to approve the ZIO AT heart monitor that Dr. Graciela Husbands would like him to wear for 7 days.   He is advised I will order this for him and he should receive it in 3-5 business days. We discussed that the monitor will be a real time monitor.  The patient voices understanding and is agreeable.  He also states he has an appointment on 6/27 with Dr. Graciela Husbands that he would like to go ahead an keep as he has some additional questions to review with Dr. Graciela Husbands.  Patient made aware that is fine and we will see him on 6/27.

## 2022-01-15 ENCOUNTER — Ambulatory Visit: Payer: Medicare HMO | Admitting: Internal Medicine

## 2022-01-15 ENCOUNTER — Encounter: Payer: Self-pay | Admitting: Internal Medicine

## 2022-01-15 VITALS — BP 138/84 | HR 61 | Ht 68.0 in | Wt 213.0 lb

## 2022-01-15 DIAGNOSIS — R072 Precordial pain: Secondary | ICD-10-CM

## 2022-01-15 DIAGNOSIS — I4811 Longstanding persistent atrial fibrillation: Secondary | ICD-10-CM

## 2022-01-15 DIAGNOSIS — I483 Typical atrial flutter: Secondary | ICD-10-CM | POA: Diagnosis not present

## 2022-01-15 DIAGNOSIS — I10 Essential (primary) hypertension: Secondary | ICD-10-CM | POA: Diagnosis not present

## 2022-01-15 DIAGNOSIS — Z01812 Encounter for preprocedural laboratory examination: Secondary | ICD-10-CM

## 2022-01-15 MED ORDER — NEBIVOLOL HCL 5 MG PO TABS: 5.0000 mg | ORAL_TABLET | Freq: Every day | ORAL | 0 refills | Status: DC

## 2022-01-15 MED ORDER — METOPROLOL TARTRATE 50 MG PO TABS: ORAL_TABLET | ORAL | 0 refills | Status: DC

## 2022-01-15 MED ORDER — BISOPROLOL FUMARATE 5 MG PO TABS: 5.0000 mg | ORAL_TABLET | Freq: Every day | ORAL | 0 refills | Status: DC

## 2022-01-15 MED ORDER — ATENOLOL 50 MG PO TABS: 50.0000 mg | ORAL_TABLET | Freq: Every day | ORAL | 0 refills | Status: DC
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
## 2022-01-16 ENCOUNTER — Inpatient Hospital Stay: Payer: Medicare HMO

## 2022-01-16 ENCOUNTER — Encounter: Payer: Self-pay | Admitting: Internal Medicine

## 2022-01-16 ENCOUNTER — Inpatient Hospital Stay: Payer: Medicare HMO | Attending: Oncology | Admitting: Oncology

## 2022-01-16 ENCOUNTER — Encounter: Payer: Self-pay | Admitting: Oncology

## 2022-01-16 ENCOUNTER — Other Ambulatory Visit: Payer: Self-pay | Admitting: *Deleted

## 2022-01-16 VITALS — BP 133/76 | HR 65 | Temp 97.1°F | Resp 18 | Ht 68.0 in | Wt 213.5 lb

## 2022-01-16 DIAGNOSIS — Z79899 Other long term (current) drug therapy: Secondary | ICD-10-CM | POA: Insufficient documentation

## 2022-01-16 DIAGNOSIS — R1011 Right upper quadrant pain: Secondary | ICD-10-CM | POA: Diagnosis not present

## 2022-01-16 DIAGNOSIS — D72829 Elevated white blood cell count, unspecified: Secondary | ICD-10-CM | POA: Insufficient documentation

## 2022-01-16 DIAGNOSIS — Z809 Family history of malignant neoplasm, unspecified: Secondary | ICD-10-CM | POA: Diagnosis not present

## 2022-01-16 DIAGNOSIS — D649 Anemia, unspecified: Secondary | ICD-10-CM

## 2022-01-16 DIAGNOSIS — K7030 Alcoholic cirrhosis of liver without ascites: Secondary | ICD-10-CM | POA: Diagnosis present

## 2022-01-16 LAB — COMPREHENSIVE METABOLIC PANEL
ALT: 44 U/L (ref 0–44)
AST: 25 U/L (ref 15–41)
Albumin: 4.7 g/dL (ref 3.5–5.0)
Alkaline Phosphatase: 57 U/L (ref 38–126)
Anion gap: 6 (ref 5–15)
BUN: 19 mg/dL (ref 6–20)
CO2: 31 mmol/L (ref 22–32)
Calcium: 8.9 mg/dL (ref 8.9–10.3)
Chloride: 97 mmol/L — ABNORMAL LOW (ref 98–111)
Creatinine, Ser: 0.97 mg/dL (ref 0.61–1.24)
GFR, Estimated: >60 mL/min (ref >60)
Glucose, Bld: 100 mg/dL — ABNORMAL HIGH (ref 70–99)
Potassium: 4.6 mmol/L (ref 3.5–5.1)
Sodium: 134 mmol/L — ABNORMAL LOW (ref 135–145)
Total Bilirubin: 0.8 mg/dL (ref 0.3–1.2)
Total Protein: 7.8 g/dL (ref 6.5–8.1)

## 2022-01-16 LAB — CBC WITH DIFFERENTIAL/PLATELET
Abs Immature Granulocytes: 0.11 10*3/uL — ABNORMAL HIGH (ref 0.00–0.07)
Basophils Absolute: 0.1 10*3/uL (ref 0.0–0.1)
Basophils Relative: 0 %
Eosinophils Absolute: 0.1 10*3/uL (ref 0.0–0.5)
Eosinophils Relative: 1 %
HCT: 45.7 % (ref 39.0–52.0)
Hemoglobin: 15.5 g/dL (ref 13.0–17.0)
Immature Granulocytes: 1 %
Lymphocytes Relative: 27 %
Lymphs Abs: 3.1 10*3/uL (ref 0.7–4.0)
MCH: 30.5 pg (ref 26.0–34.0)
MCHC: 33.9 g/dL (ref 30.0–36.0)
MCV: 90 fL (ref 80.0–100.0)
Monocytes Absolute: 1 10*3/uL (ref 0.1–1.0)
Monocytes Relative: 9 %
Neutro Abs: 7.2 10*3/uL (ref 1.7–7.7)
Neutrophils Relative %: 62 %
Platelets: 293 10*3/uL (ref 150–400)
RBC: 5.08 MIL/uL (ref 4.22–5.81)
RDW: 15.5 % (ref 11.5–15.5)
WBC: 11.6 10*3/uL — ABNORMAL HIGH (ref 4.0–10.5)
nRBC: 0 % (ref 0.0–0.2)

## 2022-01-16 LAB — RETIC PANEL
Immature Retic Fract: 4.5 % (ref 2.3–15.9)
RBC.: 5.08 MIL/uL (ref 4.22–5.81)
Retic Count, Absolute: 54.9 10*3/uL (ref 19.0–186.0)
Retic Ct Pct: 1.1 % (ref 0.4–3.1)
Reticulocyte Hemoglobin: 35.9 pg (ref >27.9)

## 2022-01-16 LAB — TECHNOLOGIST SMEAR REVIEW
Plt Morphology: NORMAL
RBC MORPHOLOGY: NORMAL
WBC MORPHOLOGY: NORMAL

## 2022-01-16 LAB — LACTATE DEHYDROGENASE: LDH: 105 U/L (ref 98–192)

## 2022-01-16 MED ORDER — APIXABAN 5 MG PO TABS: 5.0000 mg | ORAL_TABLET | Freq: Two times a day (BID) | ORAL | 5 refills | Status: DC

## 2022-01-16 NOTE — Assessment & Plan Note (Signed)
Chronic leukocytosis since 2019.  Differential is normal.   Tech smear showed normal morphology.

## 2022-01-16 NOTE — Progress Notes (Signed)
Hematology/Oncology Consult note [phone] [phone]      Patient Care Team: [facility] as PCP - General Rockey Situ, Kathlene November, MD as Consulting Physician (Cardiology) Leone Haven, MD (Family Medicine)   REFERRING PROVIDER: Vladimir Crofts, MD  CHIEF COMPLAINTS/REASON FOR VISIT:  Anemia  ASSESSMENT & PLAN:   Right upper quadrant abdominal pain Patient has a history of alcoholic cirrhosis. Recommend right upper quadrant ultrasound for further evaluation.  Normocytic anemia Acute anemia is likely secondary to acute blood loss secondary to the proximal humerus fracture ORIP.  A follow-up CBC being April 2023 showed improvement.  Repeat CBC today showed complete resolution of hemoglobin.  I will hold off additional work-up.  Family history of cancer Recommend genetic counseling.  He agrees.  I will refer  Leukocytosis Chronic leukocytosis since 2019.  Differential is normal.   Tech smear showed normal morphology.  Orders Placed This Encounter  Procedures   US Abdomen Limited RUQ (LIVER/GB)    Standing Status:   Future    Standing Expiration Date:   01/17/2023    Order Specific Question:   Reason for Exam (SYMPTOM  OR DIAGNOSIS REQUIRED)    Answer:   abdominal pain    Order Specific Question:   Preferred imaging location?    Answer:   [facility]   Comprehensive metabolic panel    Standing Status:   Future    Number of Occurrences:   1    Standing Expiration Date:   01/17/2023   CBC with Differential/Platelet    Standing Status:   Future    Number of Occurrences:   1    Standing Expiration Date:   01/17/2023   Technologist smear review    Standing Status:   Future    Number of Occurrences:   1    Standing Expiration Date:   01/17/2023   Retic Panel    Standing Status:   Future    Number of Occurrences:   1    Standing Expiration Date:   01/17/2023   Lactate dehydrogenase    Standing Status:   Future    Number of  Occurrences:   1    Standing Expiration Date:   01/17/2023   Ambulatory referral to Genetics    Referral Priority:   Routine    Referral Type:   Consultation    Referral Reason:   Specialty Services Required    Number of Visits Requested:   1   Follow-up as needed. All questions were answered. The patient knows to call the clinic with any problems, questions or concerns.  Earlie Server, MD, PhD [facility] Hematology Oncology 01/16/2022     HISTORY OF PRESENTING ILLNESS:  Derrick Murphy is a  56 y.o.  male with PMH listed below who was referred to me for anemia Reviewed patient's recent labs that was done.  He was found to have abnormal CBC on 09/29/2021 with a hemoglobin of 9.2, WBC 11.4, platelet count 242. Recently was seen by neurology for evaluation of bilateral upper extremity numbness tingling started in January/February 2023. Neurology noticed that he had decreased hemoglobin and referred him to establish care with hematology for further evaluation + Intermittent right upper quadrant pain, no significant association with food intake. He denies recent chest pain on exertion, shortness of breath on minimal exertion, pre-syncopal episodes, or palpitations He had not noticed any recent bleeding such as epistaxis, hematuria or hematochezia.  He denies over the counter NSAID ingestion.  Patient takes apixaban  5 mg twice daily for atrial fibrillation/CHF. His last colonoscopy was a year ago.  Records were not available in current EMR. He denies any pica and eats a variety of diet. He denies any unintentional weight loss, night sweats, fever or chills.  09/27/2021 - 09/28/2021, patient was admitted for proximal humerus fracture status post ORIF and right bicep tenodesis MEDICAL HISTORY:  Past Medical History:  Diagnosis Date   Alcohol abuse    Atrial fibrillation (HCC)    CHF (congestive heart failure) ([place])    8/18: EF 50-55%, mild LVH, RA/RV- mild dilation   Depression    Hypertension     Tobacco abuse     SURGICAL HISTORY: Past Surgical History:  Procedure Laterality Date   ABLATION     2x   CARDIOVERSION     4x   ORIF HUMERUS FRACTURE Right 09/27/2021   Procedure: ORIF right proximal humerus, biceps tenodesis;  Surgeon: Leim Fabry, MD;  Location: [name];  Service: Orthopedics;  Laterality: Right;    SOCIAL HISTORY: Social History   Socioeconomic History   Marital status: Single    Spouse name: Not on file   Number of children: 0   Years of education: Not on file   Highest education level: Bachelor's degree (e.g., BA, AB, BS)  Occupational History   Not on file  Tobacco Use   Smoking status: Former    Packs/day: 0.50    Years: 2.00    Total pack years: 1.00    Types: Cigarettes    Quit date: 06/15/2016    Years since quitting: 5.5   Smokeless tobacco: Never  Vaping Use   Vaping Use: Never used  Substance and Sexual Activity   Alcohol use: Not Currently   Drug use: Yes    Types: Methamphetamines   Sexual activity: Yes    Partners: Female  Other Topics Concern   Not on file  Social History Narrative   Lives alone   Right handed   Caffeine: 2-3 C of coffee AM   Social Determinants of Radio broadcast assistant Strain: Not on file  Food Insecurity: Not on file  Transportation Needs: Not on file  Physical Activity: Not on file  Stress: Not on file  Social Connections: Not on file  Intimate Partner Violence: Not on file    FAMILY HISTORY: Family History  Problem Relation Age of Onset   Heart disease Mother    Breast cancer Mother        metastatic   Heart disease Father    Dementia Father    Brain cancer Maternal Uncle    Heart disease Paternal Uncle     ALLERGIES:  is allergic to morphine.  MEDICATIONS:  Current Outpatient Medications  Medication Sig Dispense Refill   apixaban (ELIQUIS) 5 MG TABS tablet Take 1 tablet (5 mg total) by mouth 2 (two) times daily. 60 tablet 5   atenolol (TENORMIN) 50 MG tablet Take 1 tablet  (50 mg total) by mouth daily. 30 tablet 0   bisoprolol (ZEBETA) 5 MG tablet Take 1 tablet (5 mg total) by mouth daily. 30 tablet 0   cyclobenzaprine (FLEXERIL) 5 MG tablet Take 1 tablet (5 mg total) by mouth 3 (three) times daily as needed for muscle spasms. 12 tablet 0   diltiazem (CARDIZEM) 30 MG tablet Take 1 tablet (30 mg total) by mouth 2 (two) times daily as needed (Hr greater than 130). 180 tablet 3   lisinopril (ZESTRIL) 10 MG tablet Take 1 tablet (  10 mg total) by mouth daily. (Patient taking differently: Take 20 mg by mouth daily.) 30 tablet 0   traZODone (DESYREL) 50 MG tablet Take 50 mg by mouth at bedtime as needed for sleep.     metoprolol tartrate (LOPRESSOR) 50 MG tablet Take 1 tablet (50 mg) by mouth 2 hours prior to your Cardiac CT scan (Patient not taking: Reported on 01/16/2022) 1 tablet 0   No current facility-administered medications for this visit.    Review of Systems  Constitutional:  Negative for appetite change, chills, fatigue, fever and unexpected weight change.  HENT:   Negative for hearing loss and voice change.   Eyes:  Negative for eye problems and icterus.  Respiratory:  Negative for chest tightness, cough and shortness of breath.   Cardiovascular:  Negative for chest pain and leg swelling.  Gastrointestinal:  Negative for abdominal distention and abdominal pain.  Endocrine: Negative for hot flashes.  Genitourinary:  Negative for difficulty urinating, dysuria and frequency.   Musculoskeletal:  Negative for arthralgias.  Skin:  Negative for itching and rash.  Neurological:  Positive for numbness. Negative for light-headedness.  Hematological:  Negative for adenopathy. Does not bruise/bleed easily.  Psychiatric/Behavioral:  Negative for confusion.     PHYSICAL EXAMINATION: ECOG PERFORMANCE STATUS: 1 - Symptomatic but completely ambulatory Vitals:   01/16/22 1115  BP: 133/76  Pulse: 65  Resp: 18  Temp: (!) 97.1 F (36.2 C)   Filed Weights   01/16/22  1115  Weight: 213 lb 8 oz (96.8 kg)    Physical Exam Constitutional:      General: He is not in acute distress. HENT:     Head: Normocephalic and atraumatic.  Eyes:     General: No scleral icterus. Cardiovascular:     Rate and Rhythm: Normal rate and regular rhythm.     Heart sounds: Normal heart sounds.  Pulmonary:     Effort: Pulmonary effort is normal. No respiratory distress.     Breath sounds: No wheezing.  Abdominal:     General: Bowel sounds are normal. There is no distension.     Palpations: Abdomen is soft.  Musculoskeletal:        General: No deformity. Normal range of motion.     Cervical back: Normal range of motion and neck supple.  Skin:    General: Skin is warm and dry.     Findings: No erythema or rash.  Neurological:     Mental Status: He is alert and oriented to person, place, and time. Mental status is at baseline.     Cranial Nerves: No cranial nerve deficit.     Coordination: Coordination normal.  Psychiatric:        Mood and Affect: Mood normal.      LABORATORY DATA:  I have reviewed the data as listed    Latest Ref Rng & Units 01/16/2022   12:27 PM 09/29/2021    2:26 PM 09/28/2021    3:46 AM  CBC  WBC 4.0 - 10.5 K/uL 11.6  11.4  18.2   Hemoglobin 13.0 - 17.0 g/dL 15.5  9.2  10.2   Hematocrit 39.0 - 52.0 % 45.7  28.6  31.2   Platelets 150 - 400 K/uL 293  242  330       Latest Ref Rng & Units 01/16/2022   12:27 PM 09/28/2021    3:46 AM 09/26/2021   10:15 AM  CMP  Glucose 70 - 99 mg/dL 100  147  118  BUN 6 - 20 mg/dL '19  15  24   '$ Creatinine 0.61 - 1.24 mg/dL 0.97  0.77  0.89   Sodium 135 - 145 mmol/L 134  137  141   Potassium 3.5 - 5.1 mmol/L 4.6  4.4  4.4   Chloride 98 - 111 mmol/L 97  103  105   CO2 22 - 32 mmol/L '31  29  28   '$ Calcium 8.9 - 10.3 mg/dL 8.9  8.2  9.1   Total Protein 6.5 - 8.1 g/dL 7.8   7.0   Total Bilirubin 0.3 - 1.2 mg/dL 0.8   0.6   Alkaline Phos 38 - 126 U/L 57   65   AST 15 - 41 U/L 25   19   ALT 0 - 44 U/L 44   20          RADIOGRAPHIC STUDIES: I have personally reviewed the radiological images as listed and agreed with the findings in the report. DG Foot Complete Right  Result Date: 01/01/2022 Please see detailed radiograph report in office note.

## 2022-01-16 NOTE — Assessment & Plan Note (Addendum)
Patient has a history of alcoholic cirrhosis. Recommend right upper quadrant ultrasound for further evaluation.

## 2022-01-16 NOTE — Progress Notes (Signed)
Pt here for follow up. Reports he is having GI issue, but not being followed by GI

## 2022-01-16 NOTE — Assessment & Plan Note (Signed)
Recommend genetic counseling.  He agrees.  I will refer

## 2022-01-16 NOTE — Assessment & Plan Note (Signed)
Acute anemia is likely secondary to acute blood loss secondary to the proximal humerus fracture ORIP.  A follow-up CBC being April 2023 showed improvement.  Repeat CBC today showed complete resolution of hemoglobin.  I will hold off additional work-up.

## 2022-01-16 NOTE — Telephone Encounter (Signed)
Prescription refill request for Eliquis received. Indication: Atrial Fib Last office visit: 01/15/22  Derrick Pia MD Scr: 0.9 on 11/09/21 Age:  56 Weight: 96.6kg  Based on above findings Eliquis '5mg'$  twice daily is the appropriate dose.  Refill approved.

## 2022-01-17 ENCOUNTER — Telehealth (HOSPITAL_COMMUNITY): Payer: Self-pay | Admitting: *Deleted

## 2022-01-17 NOTE — Telephone Encounter (Signed)
Reaching out to patient to offer assistance regarding upcoming cardiac imaging study; pt verbalizes understanding of appt date/time, parking situation and where to check in, pre-test NPO status and medications ordered, and verified current allergies; name and call back number provided for further questions should they arise  Derrick Clement RN Navigator Cardiac Imaging [facility] [phone] office [phone] cell  Patient to take '50mg'$  metoprolol tartrate two hours prior to his cardiac CT scan and hold his other beta blockers.

## 2022-01-21 ENCOUNTER — Ambulatory Visit
Admission: RE | Admit: 2022-01-21 | Discharge: 2022-01-21 | Disposition: A | Discharge status: Home / Self Care | Payer: Medicare HMO | Source: Ambulatory Visit | Attending: Internal Medicine | Admitting: Internal Medicine

## 2022-01-21 DIAGNOSIS — R072 Precordial pain: Secondary | ICD-10-CM | POA: Insufficient documentation

## 2022-01-21 MED ORDER — NITROGLYCERIN 0.4 MG SL SUBL
0.8000 mg | SUBLINGUAL_TABLET | Freq: Once | SUBLINGUAL | Status: AC
  Administered 2022-01-21: 0.8 mg via SUBLINGUAL

## 2022-01-21 MED ORDER — IOHEXOL 350 MG/ML SOLN
100.0000 mL | Freq: Once | INTRAVENOUS | Status: AC | PRN
  Administered 2022-01-21: 100 mL via INTRAVENOUS

## 2022-01-21 NOTE — Progress Notes (Signed)
Patient tolerated procedure well. Ambulate w/o difficulty. Denies any lightheadedness or being dizzy. Pt denies any pain at this time. Sitting in chair, drinking water provided. P is encouraged to drink additional water throughout the day and reason explained to patient. Patient verbalized understanding and all questions answered. ABC intact. No further needs at this time. Discharge from procedure area w/o issues.  °

## 2022-01-23 ENCOUNTER — Ambulatory Visit
Admission: RE | Admit: 2022-01-23 | Discharge: 2022-01-23 | Disposition: A | Discharge status: Home / Self Care | Payer: Medicare HMO | Source: Ambulatory Visit | Attending: Neurology | Admitting: Neurology

## 2022-01-23 DIAGNOSIS — R202 Paresthesia of skin: Secondary | ICD-10-CM | POA: Diagnosis present

## 2022-01-25 ENCOUNTER — Ambulatory Visit
Admission: RE | Admit: 2022-01-25 | Discharge: 2022-01-25 | Disposition: A | Discharge status: Home / Self Care | Payer: Medicare HMO | Source: Ambulatory Visit | Attending: Oncology | Admitting: Oncology

## 2022-01-25 DIAGNOSIS — R1011 Right upper quadrant pain: Secondary | ICD-10-CM | POA: Insufficient documentation

## 2022-02-01 ENCOUNTER — Telehealth: Payer: Self-pay | Admitting: Internal Medicine

## 2022-02-01 NOTE — Telephone Encounter (Signed)
Attempted to call the patient. No answer- I left a detailed message of results on his voice mail (ok per DPR).   I asked that he call back with any further questions/ concerns.

## 2022-02-01 NOTE — Telephone Encounter (Signed)
Derrick Sprang, MD  01/31/2022  7:32 PM EDT     Please Inform Patient   That CTA shows non significant CAD ie no major blockage    Thanks

## 2022-02-05 ENCOUNTER — Other Ambulatory Visit: Payer: Self-pay | Admitting: Internal Medicine

## 2022-02-07 ENCOUNTER — Inpatient Hospital Stay: Payer: Medicare HMO

## 2022-02-07 ENCOUNTER — Inpatient Hospital Stay: Payer: Medicare HMO | Admitting: Licensed Clinical Social Worker

## 2022-02-20 ENCOUNTER — Inpatient Hospital Stay: Payer: Medicare HMO | Attending: Oncology | Admitting: Licensed Clinical Social Worker

## 2022-02-20 ENCOUNTER — Encounter: Payer: Self-pay | Admitting: Licensed Clinical Social Worker

## 2022-02-20 ENCOUNTER — Inpatient Hospital Stay: Payer: Medicare HMO

## 2022-02-20 DIAGNOSIS — Z803 Family history of malignant neoplasm of breast: Secondary | ICD-10-CM

## 2022-02-20 DIAGNOSIS — Z808 Family history of malignant neoplasm of other organs or systems: Secondary | ICD-10-CM

## 2022-02-20 NOTE — Progress Notes (Signed)
REFERRING PROVIDER: Earlie Server, MD [place],  [place] [address]  PRIMARY PROVIDER:  [facility]  PRIMARY REASON FOR VISIT:  1. Family history of breast cancer   2. Family history of brain cancer      HISTORY OF PRESENT ILLNESS:   Mr. Derrick Murphy, a 56 y.o. male, was seen for a [place] cancer genetics consultation at the request of Dr. Tasia Catchings due to a family history of cancer.  Mr. Steinhart presents to clinic today to discuss the possibility of a hereditary predisposition to cancer, genetic testing, and to further clarify his future cancer risks, as well as potential cancer risks for family members.   CANCER HISTORY:  Mr. Mcclanahan is a 56 y.o. male with no personal history of cancer.  He had a colonoscopy in 2022 that reportedly showed a few benign polyps.  Past Medical History:  Diagnosis Date   Alcohol abuse    Atrial fibrillation (HCC)    CHF (congestive heart failure) ([place])    8/18: EF 50-55%, mild LVH, RA/RV- mild dilation   Depression    Hypertension    Tobacco abuse     Past Surgical History:  Procedure Laterality Date   ABLATION     2x   CARDIOVERSION     4x   ORIF HUMERUS FRACTURE Right 09/27/2021   Procedure: ORIF right proximal humerus, biceps tenodesis;  Surgeon: Leim Fabry, MD;  Location: [name];  Service: Orthopedics;  Laterality: Right;    FAMILY HISTORY:  We obtained a detailed, 4-generation family history.  Significant diagnoses are listed below: Family History  Problem Relation Age of Onset   Heart disease Mother    Breast cancer Mother        metastatic dx late 63s   Heart disease Father    Dementia Father    Brain cancer Maternal Uncle    Heart disease Paternal Uncle    Mr. Zappia has 2 sisters, no history of cancer.  Mr. Kamel mother had breast cancer in her late 23s-early 80s and passed from metastatic disease at 78. Patient had 3 maternal uncles, one died of brain cancer. No other known cancers on this side of the  family.  Mr. Corvin father passed at 57. No known cancers on this side of the family.   Mr. Steck is unaware of previous family history of genetic testing for hereditary cancer risks. There is no reported Ashkenazi Jewish ancestry. There is no known consanguinity.    GENETIC COUNSELING ASSESSMENT: Mr. Gales is a 56 y.o. male with a family history of cancer which is somewhat suggestive of a hereditary cancer syndrome and predisposition to cancer. We, therefore, discussed and recommended the following at today's visit.   DISCUSSION: We discussed that approximately 10% of breast cancer is hereditary. Most cases of hereditary breast cancer are associated with BRCA1/BRCA2 genes, although there are other genes associated with hereditary  cancer as well. Cancers and risks are gene specific. We discussed that testing is beneficial for several reasons including knowing about cancer risks, identifying potential screening and risk-reduction options that may be appropriate, and to understand if other family members could be at risk for cancer and allow them to undergo genetic testing.   We reviewed the characteristics, features and inheritance patterns of hereditary cancer syndromes. We also discussed genetic testing, including the appropriate family members to test, the process of testing, insurance coverage and turn-around-time for results. We discussed the implications of a negative, positive and/or variant of uncertain significant  result. While Mr. Tudisco does not meet NCCN guidelines for testing, his mother would have so it would be reasonable for him to pursue testing. We recommended Mr. Repinski pursue genetic testing for the [employer] Custom[employer]+RNA gene panel.   Based on Mr. Claxton family history of cancer, he does not meet medical criteria for genetic testing. He may have an OOP cost.   PLAN: After considering the risks, benefits, and limitations, Mr. Caligiuri  did not wish to pursue genetic testing at  today's visit. We understand this decision and remain available to coordinate genetic testing at any time in the future. We, therefore, recommend Mr. Reasoner continue to follow the cancer screening guidelines given by his primary healthcare provider.  Mr. Boettcher questions were answered to his satisfaction today. Our contact information was provided should additional questions or concerns arise. Thank you for the referral and allowing [place] to share in the care of your patient.   Faith Rogue, MS, [facility] Genetic Counselor [email or web address] Phone: [phone]  The patient was seen for a total of 20 minutes in face-to-face genetic counseling.  Dr. Grayland Ormond was available for discussion regarding this case.   _______________________________________________________________________ For Office Staff:  Number of people involved in session: 1 Was an Intern/ student involved with case: no

## 2022-03-01 ENCOUNTER — Telehealth: Payer: Self-pay

## 2022-03-01 NOTE — Telephone Encounter (Signed)
Patient has F/U appointment with Dr. Caryl Comes scheduled for 03/07/22. Left voicemail message requesting to have patient call back to let us know whether or not he wore the ZIO monitor. No results in [place].

## 2022-03-03 ENCOUNTER — Other Ambulatory Visit: Payer: Self-pay | Admitting: Internal Medicine

## 2022-03-04 ENCOUNTER — Encounter: Payer: Self-pay | Admitting: Internal Medicine

## 2022-03-04 ENCOUNTER — Telehealth: Payer: Self-pay | Admitting: Internal Medicine

## 2022-03-04 MED ORDER — ATENOLOL 50 MG PO TABS: 50.0000 mg | ORAL_TABLET | Freq: Every day | ORAL | 0 refills | Status: DC

## 2022-03-04 NOTE — Telephone Encounter (Signed)
 *  STAT* If patient is at the pharmacy, call can be transferred to refill team.   1. Which medications need to be refilled? (please list name of each medication and dose if known) atenolol (TENORMIN) 50 MG tablet  2. Which pharmacy/location (including street and city if local pharmacy) is medication to be sent to? [id number] [place]. Do they need a 30 day or 90 day supply? 90 days   Pt is requesting for 90 days supply instead of 30 days

## 2022-03-04 NOTE — Telephone Encounter (Signed)
Spoke with patient who states he never received the monitor so he is going to call ZIO today to find out the status of it. Patient would like to know if he should reschedule his appt with Dr. Caryl Comes on the 17th? (I do not see any openings in the near future.) Please advise. Thank you!

## 2022-03-04 NOTE — Telephone Encounter (Signed)
Requested Prescriptions   Signed Prescriptions Disp Refills   atenolol (TENORMIN) 50 MG tablet 90 tablet 0    Sig: Take 1 tablet (50 mg total) by mouth daily.    Authorizing Provider: Deboraha Sprang    Ordering User: Raelene Bott, Jonah Nestle L

## 2022-03-04 NOTE — Telephone Encounter (Signed)
Pt is returning call.  

## 2022-03-05 ENCOUNTER — Telehealth: Payer: Self-pay | Admitting: Internal Medicine

## 2022-03-05 NOTE — Telephone Encounter (Signed)
Pt called to reschedule appt with Dr. Caryl Comes and he rescheduled for 04/25/22. He also states that he spoke with zio monitor company and they are saying they never received an order for zio monitor.

## 2022-03-07 ENCOUNTER — Ambulatory Visit: Payer: Medicare HMO | Admitting: Internal Medicine

## 2022-03-07 ENCOUNTER — Other Ambulatory Visit: Payer: Self-pay | Admitting: *Deleted

## 2022-03-07 ENCOUNTER — Ambulatory Visit (INDEPENDENT_AMBULATORY_CARE_PROVIDER_SITE_OTHER): Payer: Medicare HMO

## 2022-03-07 DIAGNOSIS — I4811 Longstanding persistent atrial fibrillation: Secondary | ICD-10-CM

## 2022-03-07 NOTE — Progress Notes (Signed)
Monitor ordered

## 2022-03-07 NOTE — Telephone Encounter (Signed)
Reviewed the patient's chart. I did place his original ZIO AT order on 01/11/22, however, upon further review, this order did not cross over to the appointments tab. (Reason, may be that the phone note I ordered this in originated as a [employer] encounter).   I have reordered the patient's monitor as of today.   I have sent him a MyChart message to notify him of the above and ask that he please call the office or send me a MyChart message if he does not receive the monitor after 5 business days.

## 2022-03-07 NOTE — Telephone Encounter (Addendum)
See 03/05/22 phone note.

## 2022-03-10 DIAGNOSIS — I4811 Longstanding persistent atrial fibrillation: Secondary | ICD-10-CM

## 2022-04-25 ENCOUNTER — Ambulatory Visit: Payer: Medicare HMO | Attending: Internal Medicine | Admitting: Internal Medicine

## 2022-04-25 ENCOUNTER — Encounter: Payer: Self-pay | Admitting: Internal Medicine

## 2022-04-25 VITALS — BP 116/60 | HR 44 | Ht 68.0 in | Wt 237.0 lb

## 2022-04-25 DIAGNOSIS — I10 Essential (primary) hypertension: Secondary | ICD-10-CM | POA: Diagnosis not present

## 2022-04-25 DIAGNOSIS — I483 Typical atrial flutter: Secondary | ICD-10-CM | POA: Diagnosis not present

## 2022-04-25 DIAGNOSIS — I4811 Longstanding persistent atrial fibrillation: Secondary | ICD-10-CM

## 2022-04-25 MED ORDER — ATENOLOL 25 MG PO TABS: 25.0000 mg | ORAL_TABLET | Freq: Every day | ORAL | 3 refills | Status: DC
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
## 2022-04-25 NOTE — Patient Instructions (Addendum)
Medication Instructions:  Your physician has recommended you make the following change in your medication:   ** Stop Atenolol '50mg'$   ** Begin Atenolol '25mg'$  - 1 tablet by mouth daily   *If you need a refill on your cardiac medications before your next appointment, please call your pharmacy*   Lab Work: None ordered.  If you have labs (blood work) drawn today and your tests are completely normal, you will receive your results only by: [place] (if you have MyChart) OR A paper copy in the mail If you have any lab test that is abnormal or we need to change your treatment, we will call you to review the results.   Testing/Procedures: None ordered.    Follow-Up: At [facility], you and your health needs are our priority.  As part of our continuing mission to provide you with exceptional heart care, we have created designated Provider Care Teams.  These Care Teams include your primary Cardiologist (physician) and Advanced Practice Providers (APPs -  Physician Assistants and Nurse Practitioners) who all work together to provide you with the care you need, when you need it.  We recommend signing up for the patient portal called "MyChart".  Sign up information is provided on this After Visit Summary.  MyChart is used to connect with patients for Virtual Visits (Telemedicine).  Patients are able to view lab/test results, encounter notes, upcoming appointments, etc.  Non-urgent messages can be sent to your provider as well.   To learn more about what you can do with MyChart, go to [email or web address].    Your next appointment:   12 months with Dr Caryl Comes  Important Information About [place]

## 2022-05-13 ENCOUNTER — Ambulatory Visit (LOCAL_COMMUNITY_HEALTH_CENTER): Payer: Medicare HMO

## 2022-05-13 DIAGNOSIS — Z23 Encounter for immunization: Secondary | ICD-10-CM

## 2022-05-13 NOTE — Progress Notes (Signed)
  Are you feeling sick today? No   Have you ever received a dose of COVID-19 Vaccine? [employer], [place], [place], [place], Other) Yes  If yes, which vaccine and how many doses?   PFIZER, 5   Did you bring the vaccination record card or other documentation?  Yes   Do you have a health condition or are undergoing treatment that makes you moderately or severely immunocompromised? This would include, but not be limited to: cancer, HIV, organ transplant, immunosuppressive therapy/high-dose corticosteroids, or moderate/severe primary immunodeficiency.  No  Have you received COVID-19 vaccine before or during hematopoietic cell transplant (HCT) or CAR-T-cell therapies? No  Have you ever had an allergic reaction to: (This would include a severe allergic reaction or a reaction that caused hives, swelling, or respiratory distress, including wheezing.) A component of a COVID-19 vaccine or a previous dose of COVID-19 vaccine? No   Have you ever had an allergic reaction to another vaccine (other thanCOVID-19 vaccine) or an injectable medication? (This would include a severe allergic reaction or a reaction that caused hives, swelling, or respiratory distress, including wheezing.)   No    Do you have a history of any of the following:  Myocarditis or Pericarditis No  Dermal fillers:  No  Multisystem Inflammatory Syndrome (MIS-C or MIS-A)? No  COVID-19 disease within the past 3 months? No  Vaccinated with monkeypox vaccine in the last 4 weeks? No  Eligible and administered [place] 12Y+, [id number]. Monitored, tolerated well. Verbalized understanding of VIS and NCIR copy. M.Taz Vanness, LPN.

## 2022-05-16 ENCOUNTER — Encounter: Payer: Self-pay | Admitting: Urology

## 2022-05-16 ENCOUNTER — Ambulatory Visit (INDEPENDENT_AMBULATORY_CARE_PROVIDER_SITE_OTHER): Payer: Medicare HMO | Admitting: Urology

## 2022-05-16 VITALS — BP 123/81 | HR 63 | Ht 68.0 in | Wt 241.8 lb

## 2022-05-16 DIAGNOSIS — R350 Frequency of micturition: Secondary | ICD-10-CM | POA: Diagnosis not present

## 2022-05-16 DIAGNOSIS — N401 Enlarged prostate with lower urinary tract symptoms: Secondary | ICD-10-CM

## 2022-05-16 LAB — BLADDER SCAN AMB NON-IMAGING

## 2022-05-16 LAB — MICROSCOPIC EXAMINATION

## 2022-05-16 LAB — URINALYSIS, COMPLETE
Bilirubin, UA: NEGATIVE
Glucose, UA: NEGATIVE
Ketones, UA: NEGATIVE
Leukocytes,UA: NEGATIVE
Nitrite, UA: NEGATIVE
Protein,UA: NEGATIVE
RBC, UA: NEGATIVE
Specific Gravity, UA: 1.02 (ref 1.005–1.030)
Urobilinogen, Ur: 0.2 mg/dL (ref 0.2–1.0)
pH, UA: 6 (ref 5.0–7.5)

## 2022-05-16 MED ORDER — TAMSULOSIN HCL 0.4 MG PO CAPS: 0.4000 mg | ORAL_CAPSULE | Freq: Every day | ORAL | 11 refills | Status: DC

## 2022-05-16 NOTE — Progress Notes (Signed)
05/16/2022 12:05 PM   Derrick Murphy 09-Feb-1966 [id number]  Referring provider: Leonel Ramsay, MD [place],  [place] [address]     Chief Complaint  Patient presents with   Benign Prostatic Hypertrophy    HPI: 56 year old male who presents today for evaluation of worsening symptoms.  He reports his symptoms have been going on for several years but seems to acutely worsen the past several months.  They include nocturia times multiple as outlined below, hesitancy and sensation of incomplete emptying.  He was on Flomax started many years ago but eventually stopped this as he did not find it to be effective.  He also was concerned about retrograde ejaculation was very bothersome to him.  He does have a personal history of sleep apnea not able to tolerate CPAP.  His cough is improved with weight loss.  He denies any gross hematuria or dysuria.  No history of infections.  In the past he has been told that his prostate is enlarged.  PSA 08/27/21 1.01  Results for orders placed or performed in visit on 05/16/22  Microscopic Examination   Urine  Result Value Ref Range   WBC, UA 0-5 0 - 5 /hpf   RBC, Urine 0-2 0 - 2 /hpf   Epithelial Cells (non renal) 0-10 0 - 10 /hpf   Casts Present (A) None seen /lpf   Cast Type Hyaline casts N/A   Mucus, UA Present (A) Not Estab.   Bacteria, UA Few None seen/Few  Urinalysis, Complete  Result Value Ref Range   Specific Gravity, UA 1.020 1.005 - 1.030   pH, UA 6.0 5.0 - 7.5   Color, UA Yellow Yellow   Appearance Ur Clear Clear   Leukocytes,UA Negative Negative   Protein,UA Negative Negative/Trace   Glucose, UA Negative Negative   Ketones, UA Negative Negative   RBC, UA Negative Negative   Bilirubin, UA Negative Negative   Urobilinogen, Ur 0.2 0.2 - 1.0 mg/dL   Nitrite, UA Negative Negative   Microscopic Examination See below:   Bladder Scan (Post Void Residual) in office  Result Value Ref Range   Scan Result  53m       IPSS     Row Name 05/16/22 0900         International Prostate Symptom Score   How often have you had the sensation of not emptying your bladder? More than half the time     How often have you had to urinate less than every two hours? Almost always     How often have you found you stopped and started again several times when you urinated? About half the time     How often have you found it difficult to postpone urination? More than half the time     How often have you had a weak urinary stream? More than half the time     How often have you had to strain to start urination? Less than half the time     How many times did you typically get up at night to urinate? 5 Times     Total IPSS Score 27       Quality of Life due to urinary symptoms   If you were to spend the rest of your life with your urinary condition just the way it is now how would you feel about that? Unhappy              Score:  1-7 Mild 8-19  Moderate 20-35 Severe   PMH: Past Medical History:  Diagnosis Date   Alcohol abuse    Atrial fibrillation (HCC)    CHF (congestive heart failure) ([place])    8/18: EF 50-55%, mild LVH, RA/RV- mild dilation   Depression    Hypertension    Tobacco abuse     Surgical History: Past Surgical History:  Procedure Laterality Date   ABLATION     2x   CARDIOVERSION     4x   ORIF HUMERUS FRACTURE Right 09/27/2021   Procedure: ORIF right proximal humerus, biceps tenodesis;  Surgeon: Leim Fabry, MD;  Location: [name];  Service: Orthopedics;  Laterality: Right;    Home Medications:  Allergies as of 05/16/2022       Reactions   Morphine Nausea And Vomiting        Medication List        Accurate as of May 16, 2022 11:59 PM. If you have any questions, ask your nurse or doctor.          apixaban 5 MG Tabs tablet Commonly known as: ELIQUIS Take 1 tablet (5 mg total) by mouth 2 (two) times daily.   atenolol 25 MG tablet Commonly known as:  TENORMIN Take 1 tablet (25 mg total) by mouth daily.   cyclobenzaprine 5 MG tablet Commonly known as: FLEXERIL Take 1 tablet (5 mg total) by mouth 3 (three) times daily as needed for muscle spasms.   diltiazem 30 MG tablet Commonly known as: Cardizem Take 1 tablet (30 mg total) by mouth 2 (two) times daily as needed (Hr greater than 130).   DULoxetine 30 MG capsule Commonly known as: CYMBALTA Take by mouth daily. After 45 day, 2 tabs daily   lisinopril 20 MG tablet Commonly known as: ZESTRIL Take 20 mg by mouth daily.   tamsulosin 0.4 MG Caps capsule Commonly known as: FLOMAX Take 1 capsule (0.4 mg total) by mouth daily. Started by: Hollice Espy, MD        Allergies:  Allergies  Allergen Reactions   Morphine Nausea And Vomiting    Family History: Family History  Problem Relation Age of Onset   Heart disease Mother    Breast cancer Mother        metastatic dx late 62s   Heart disease Father    Dementia Father    Brain cancer Maternal Uncle    Heart disease Paternal Uncle     Social History:  reports that he quit smoking about 5 years ago. His smoking use included cigarettes. He has a 1.00 pack-year smoking history. He has never used smokeless tobacco. He reports that he does not currently use alcohol. He reports current drug use. Drug: Methamphetamines.   Physical Exam: BP 123/81   Pulse 63   Ht '5\' 8"'$  (1.727 m)   Wt 241 lb 12.8 oz (109.7 kg)   BMI 36.77 kg/m   Constitutional:  Alert and oriented, No acute distress. HEENT: [place] AT, moist mucus membranes.  Trachea midline, no masses. Cardiovascular: No clubbing, cyanosis, or edema. Respiratory: Normal respiratory effort, no increased work of breathing. GI: Abdomen is soft, nontender, nondistended, no abdominal masses Rectal exam: Normal sphincter tone.  Small 30 cc prostate, nontender no nodularity. Skin: No rashes, bruises or suspicious lesions. Neurologic: Grossly intact, no focal deficits, moving all 4  extremities. Psychiatric: Normal mood and affect.  Laboratory Data: Lab Results  Component Value Date   WBC 11.6 (H) 01/16/2022   HGB 15.5 01/16/2022   HCT 45.7  01/16/2022   MCV 90.0 01/16/2022   PLT 293 01/16/2022    Lab Results  Component Value Date   CREATININE 0.97 01/16/2022    Lab Results  Component Value Date   HGBA1C 5.4 02/07/2017    Urinalysis Results for orders placed or performed in visit on 05/16/22  Microscopic Examination   Urine  Result Value Ref Range   WBC, UA 0-5 0 - 5 /hpf   RBC, Urine 0-2 0 - 2 /hpf   Epithelial Cells (non renal) 0-10 0 - 10 /hpf   Casts Present (A) None seen /lpf   Cast Type Hyaline casts N/A   Mucus, UA Present (A) Not Estab.   Bacteria, UA Few None seen/Few  Urinalysis, Complete  Result Value Ref Range   Specific Gravity, UA 1.020 1.005 - 1.030   pH, UA 6.0 5.0 - 7.5   Color, UA Yellow Yellow   Appearance Ur Clear Clear   Leukocytes,UA Negative Negative   Protein,UA Negative Negative/Trace   Glucose, UA Negative Negative   Ketones, UA Negative Negative   RBC, UA Negative Negative   Bilirubin, UA Negative Negative   Urobilinogen, Ur 0.2 0.2 - 1.0 mg/dL   Nitrite, UA Negative Negative   Microscopic Examination See below:   Bladder Scan (Post Void Residual) in office  Result Value Ref Range   Scan Result 44m      Assessment & Plan:    1. Benign prostatic hyperplasia with urinary frequency Suspect urinary symptoms are likely multifactorial including storage related symptoms, possible BPH although the prostate not particularly enlarged, untreated sleep apnea, behavior related issues amongst others.  I offered him another trial of Flomax which he excepted for the time being.  Ultimately, he does not think this medication would be sustainable especially in light of ejaculatory issues.  Unclear whether or not he would benefit from finasteride, possibly may benefit from OAB type medication down the road.  At this point  time, I recommended cystoscopy/transrectal ultrasound to guide her management.  He may also be a candidate for a simple outpatient procedure such as UroLift, TURP, HoLEP, etc.  He is open to these ideas.  - Bladder Scan (Post Void Residual) in office - Urinalysis, Complete   Return for cysto/ TRUS.  AHollice Espy MD  [facility] [address] [place] [phone]

## 2022-06-05 ENCOUNTER — Other Ambulatory Visit: Payer: Medicaid Other | Admitting: Urology

## 2022-06-06 ENCOUNTER — Encounter: Payer: Self-pay | Admitting: Urology

## 2022-07-01 ENCOUNTER — Other Ambulatory Visit: Payer: Self-pay | Admitting: Internal Medicine

## 2022-07-19 ENCOUNTER — Encounter: Payer: Self-pay | Admitting: Internal Medicine

## 2022-07-22 NOTE — Telephone Encounter (Signed)
GM  Why dont we ask him to hold his ( already low dose atenolol) and lets get a 14 d ZIO and see what is happening  Thanks SK

## 2022-07-25 ENCOUNTER — Other Ambulatory Visit: Payer: Self-pay | Admitting: *Deleted

## 2022-07-25 NOTE — Progress Notes (Signed)
D/c atenolol per patient advise request.

## 2022-07-26 ENCOUNTER — Other Ambulatory Visit: Payer: Self-pay | Admitting: Cardiovascular Disease

## 2022-07-26 DIAGNOSIS — I480 Paroxysmal atrial fibrillation: Secondary | ICD-10-CM

## 2022-07-26 NOTE — Telephone Encounter (Signed)
Refill request

## 2022-07-31 ENCOUNTER — Ambulatory Visit (INDEPENDENT_AMBULATORY_CARE_PROVIDER_SITE_OTHER): Payer: Medicare HMO

## 2022-07-31 ENCOUNTER — Ambulatory Visit: Payer: Medicare HMO | Admitting: Podiatry

## 2022-07-31 DIAGNOSIS — R52 Pain, unspecified: Secondary | ICD-10-CM | POA: Diagnosis not present

## 2022-07-31 DIAGNOSIS — S90851A Superficial foreign body, right foot, initial encounter: Secondary | ICD-10-CM

## 2022-07-31 NOTE — Progress Notes (Signed)
   Chief Complaint  Patient presents with   Foot Problem    Patient feels glass may be stuck in foot     HPI: 57 y.o. male presenting today for new complaint of pain and tenderness associated to the right foot.  Patient states that about 2 weeks ago he broke a piece of glass that is when he began to notice some pain and tenderness to the fifth MTP of the right foot.  He is concerned for possible foreign body glass in the foot.  He went to the urgent care who prescribed oral antibiotics and referred him here.  Presenting for further treatment and evaluation  Past Medical History:  Diagnosis Date   Alcohol abuse    Atrial fibrillation (HCC)    CHF (congestive heart failure) ([place])    8/18: EF 50-55%, mild LVH, RA/RV- mild dilation   Depression    Hypertension    Tobacco abuse     Past Surgical History:  Procedure Laterality Date   ABLATION     2x   CARDIOVERSION     4x   ORIF HUMERUS FRACTURE Right 09/27/2021   Procedure: ORIF right proximal humerus, biceps tenodesis;  Surgeon: Leim Fabry, MD;  Location: [name];  Service: Orthopedics;  Laterality: Right;    Allergies  Allergen Reactions   Morphine Nausea And Vomiting     Physical Exam: General: The patient is alert and oriented x3 in no acute distress.  Dermatology: Skin is warm, dry and supple bilateral lower extremities. Negative for open lesions or macerations.  There is a small hyperkeratotic lesion to the plantar aspect of the fifth MTP with a central puncture through the skin.  With debridement there was a small shard of glass that came from the lesion.  No erythema or edema or any indication that there is any infection or deeper underlying cellulitis  Vascular: Palpable pedal pulses bilaterally. Capillary refill within normal limits.  Negative for any significant edema or erythema  Neurological: Light touch and protective threshold grossly intact  Musculoskeletal Exam: No pedal deformities noted.  No prior  amputations  Radiographic exam RT foot 07/31/2022: Normal osseous mineralization.  No acute fractures identified.  No foreign body identified within the tissues.  Assessment: 1.  Foreign body glass right plantar foot -Patient evaluated -Light debridement of the area was performed using a 312 scalpel and tissue nipper and the shard of glass was removed -Antibiotic ointment and a Band-Aid was applied. -Advised against going barefoot.  Recommend good supportive shoes and sneakers at all times even around the house -Return to clinic as needed      Edrick Kins, DPM [facility]  Dr. Edrick Kins, DPM    [address], [place] [address]                Office [phone]  Fax [phone]

## 2022-08-08 NOTE — Telephone Encounter (Signed)
GM again Why dont we try something different for his BP and stop his atenolol altogether 1) he can use dilt prn for palpitations, and if needs Rx for short acting med lets try 30 q6 as needed  # 30 2) lets increase his lisinopril from 20>>20/12.5 and check a BMET in about 2 weeks  then we can hoepfully get his BP down and keep his HR up

## 2022-08-12 ENCOUNTER — Other Ambulatory Visit: Payer: Self-pay | Admitting: *Deleted

## 2022-08-12 DIAGNOSIS — I4811 Longstanding persistent atrial fibrillation: Secondary | ICD-10-CM

## 2022-08-12 DIAGNOSIS — Z79899 Other long term (current) drug therapy: Secondary | ICD-10-CM

## 2022-08-12 DIAGNOSIS — I10 Essential (primary) hypertension: Secondary | ICD-10-CM

## 2022-08-12 MED ORDER — LISINOPRIL-HYDROCHLOROTHIAZIDE 20-12.5 MG PO TABS: ORAL_TABLET | ORAL | 6 refills | Status: DC

## 2022-10-07 ENCOUNTER — Telehealth: Payer: Self-pay | Admitting: Internal Medicine

## 2022-10-07 NOTE — Telephone Encounter (Signed)
Patient c/o Palpitations:  High priority if patient c/o lightheadedness, shortness of breath, or chest pain  How long have you had palpitations/irregular HR/ Afib? Are you having the symptoms now? Several weeks, and yes   Are you currently experiencing lightheadedness, SOB or CP? Lightheadedness and SOB, but its really bad at night when laying down at night.   Do you have a history of afib (atrial fibrillation) or irregular heart rhythm? Both  Have you checked your BP or HR? (document readings if available): HR is good, pt stated he had an episode of tachycardia yesterday.  Are you experiencing any other symptoms? No. Pt stated this used to be a once in a while thing but now it's feeling like its sticking with him.

## 2022-10-07 NOTE — Telephone Encounter (Signed)
Pt called stating for the past 3 weeks he's been experiencing irregular heart rhythm. Pt stated prior to the last few weeks, he would experience episodes occasionally, however he stated he would self convert. He reports now he feels like he's consistently out of rhythm. Pt reported HR generally average between 60-80, but stated yesterday it increased to 120. He also reported feeling dizzy, lightheaded, and SOB. He indicated SOB is worse at night while laying down, but denies swelling or significant weight increase.    Nurse attempted to schedule pt an appointment for this Wednesday, however pt reported he is moving to a different city Wednesday. Nurse searched appointments within other heartcare offices with no luck. Nurse informed pt that due to no open appointment slots before Wednesday, nurse recommend he report to ER or urgent for further recommendations. Pt stated he will go if things gets worse. Nurse expressed the importance of further evaluations.   Nurse will also forward message to MD for any further recommendations.

## 2022-10-08 NOTE — Telephone Encounter (Signed)
Spoke with pt who states he is not currently in Afib. Pt states his Atenolol was stopped by Dr Caryl Comes due to low heart rate but he did take Atenolol 25mg  - 1/2 tablet yesterday which did "seem to calm things down."  Pt confirms he is moving to the [place] part of [place] tomorrow and hopes to find a cardiologist in the [place] area soon.  Reviewed ED precautions.  Will forward to Dr Caryl Comes to make him aware.  Pt verbalizes understanding and thanked Therapist, sports for the call.

## 2023-01-31 ENCOUNTER — Other Ambulatory Visit: Payer: Self-pay | Admitting: Internal Medicine

## 2023-02-17 ENCOUNTER — Other Ambulatory Visit: Payer: Self-pay | Admitting: Internal Medicine

## 2023-02-17 DIAGNOSIS — I480 Paroxysmal atrial fibrillation: Secondary | ICD-10-CM

## 2023-02-18 NOTE — Telephone Encounter (Signed)
Prescription refill request for Eliquis received. Indication:afib Last office visit:10/23 Scr:1.0  11/23 Age: 57 Weight:109.7  kg  Prescription refilled

## 2023-06-02 ENCOUNTER — Encounter: Payer: Self-pay | Admitting: Internal Medicine

## 2023-06-02 ENCOUNTER — Ambulatory Visit: Payer: Medicare HMO | Attending: Internal Medicine | Admitting: Internal Medicine

## 2023-06-02 VITALS — BP 125/72 | HR 58 | Ht 68.0 in | Wt 239.0 lb

## 2023-06-02 DIAGNOSIS — I483 Typical atrial flutter: Secondary | ICD-10-CM | POA: Diagnosis not present

## 2023-06-02 DIAGNOSIS — I4811 Longstanding persistent atrial fibrillation: Secondary | ICD-10-CM | POA: Diagnosis not present

## 2023-06-02 DIAGNOSIS — R072 Precordial pain: Secondary | ICD-10-CM
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
## 2023-06-02 NOTE — Patient Instructions (Signed)
Medication Instructions:  Hold Lisinopril - check BP at home and monitor.   *If you need a refill on your cardiac medications before your next appointment, please call your pharmacy*  Follow-Up: At [facility], you and your health needs are our priority.  As part of our continuing mission to provide you with exceptional heart care, we have created designated Provider Care Teams.  These Care Teams include your primary Cardiologist (physician) and Advanced Practice Providers (APPs -  Physician Assistants and Nurse Practitioners) who all work together to provide you with the care you need, when you need it.  We recommend signing up for the patient portal called "MyChart".  Sign up information is provided on this After Visit Summary.  MyChart is used to connect with patients for Virtual Visits (Telemedicine).  Patients are able to view lab/test results, encounter notes, upcoming appointments, etc.  Non-urgent messages can be sent to your provider as well.   To learn more about what you can do with MyChart, go to [email or web address].    Your next appointment:   12 month(s)  Provider:   Nobie Putnam, MD

## 2023-06-09 ENCOUNTER — Encounter: Payer: Self-pay | Admitting: Internal Medicine

## 2023-06-10 IMAGING — MR MR HEAD W/O CM
8 of 10 series · 40 of 48 positions shown · non-contrast
Comparison: None.

CLINICAL DATA: Neuro deficit, acute, stroke suspected

EXAM:
MRI HEAD WITHOUT CONTRAST
TECHNIQUE: Multiplanar, multiecho pulse sequences of the brain and surrounding
structures were obtained without intravenous contrast.

[Series 5: ax dwi_tracew · axial · 3.0mm · 0.71mm/px · z∈[-75,+90]mm · 8 of 56 slices shown]
[im 1/56]
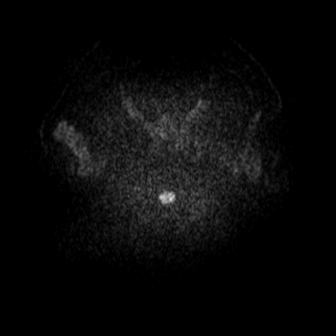
[im 8/56]
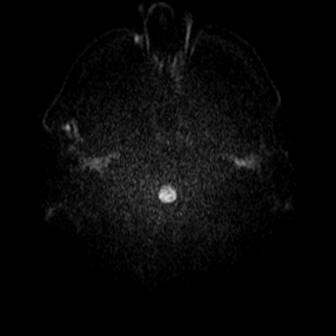
[im 16/56]
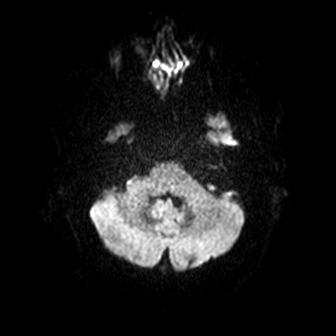
[im 24/56]
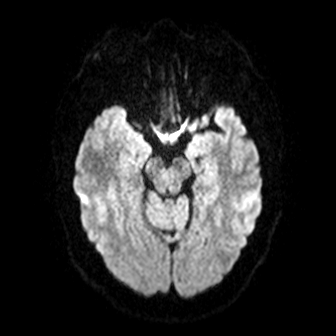
[im 32/56]
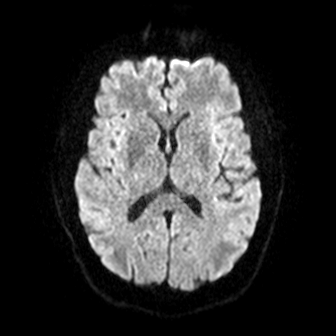
[im 40/56]
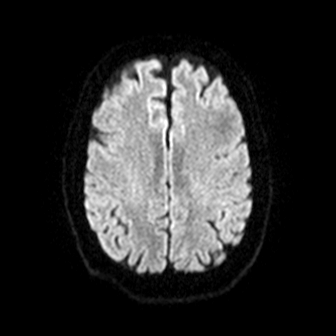
[im 48/56]
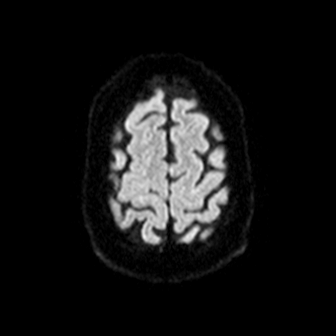
[im 56/56]
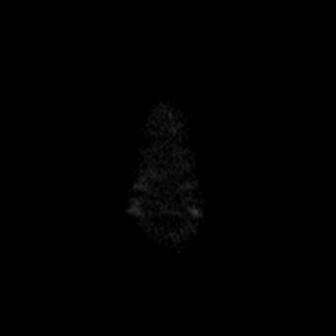

[Series 6: ax dwi_adc · axial · 3.0mm · 0.71mm/px · z∈[-75,+90]mm · 7 of 56 slices shown]
[im 1/56]
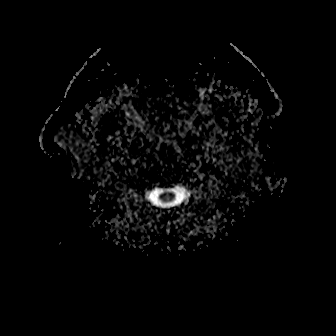
[im 10/56]
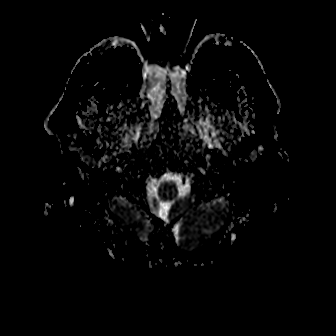
[im 19/56]
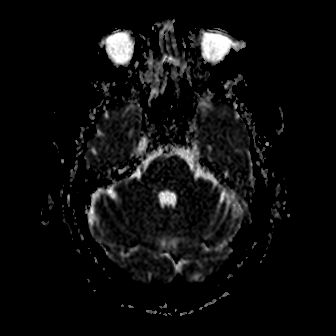
[im 28/56]
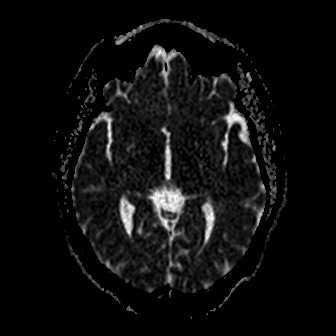
[im 37/56]
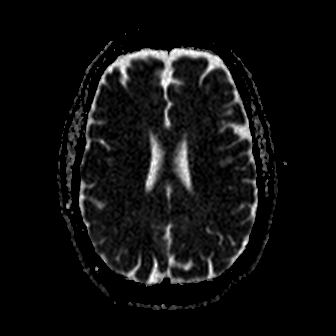
[im 46/56]
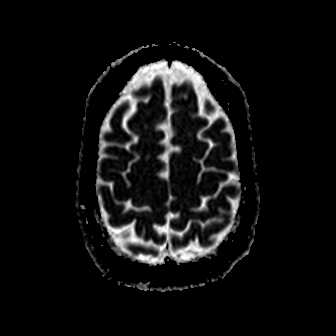
[im 56/56]
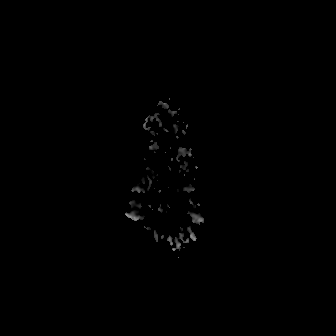

[Series 7: cor dwi_tracew · coronal · 5.0mm · 0.68mm/px · 5 of 40 slices shown]
[im 1/40]
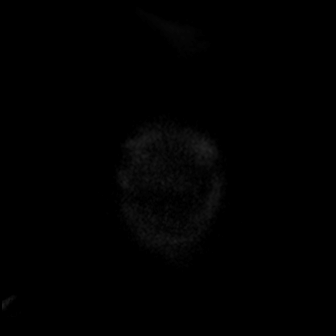
[im 10/40]
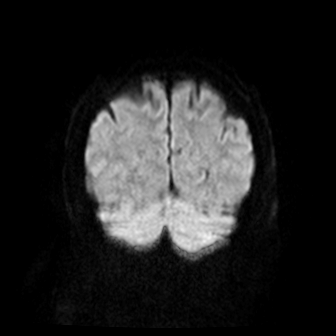
[im 20/40]
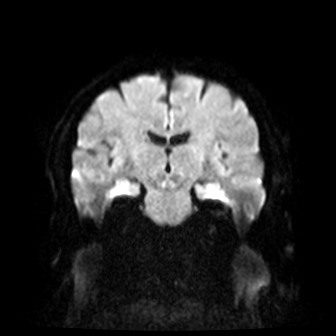
[im 30/40]
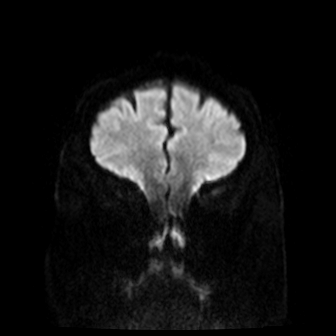
[im 40/40]
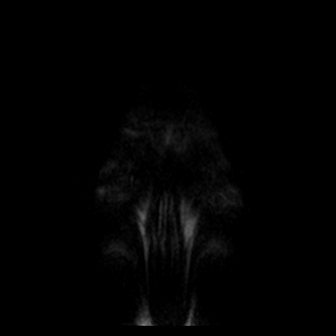

[Series 9: T1 · sagittal · 5.0mm · 0.47mm/px · 3 of 24 slices shown (1 of 2)]
[im 1/24]
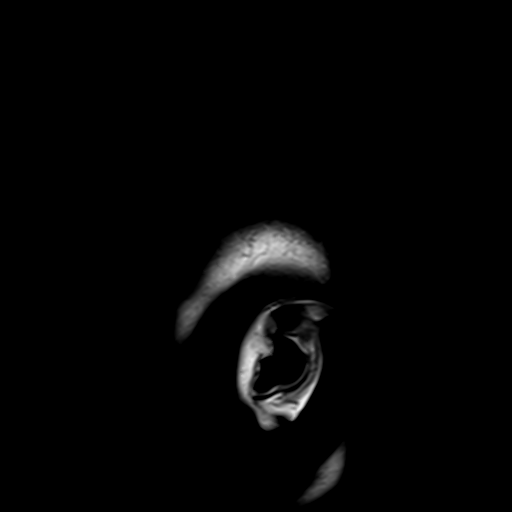
[im 12/24]
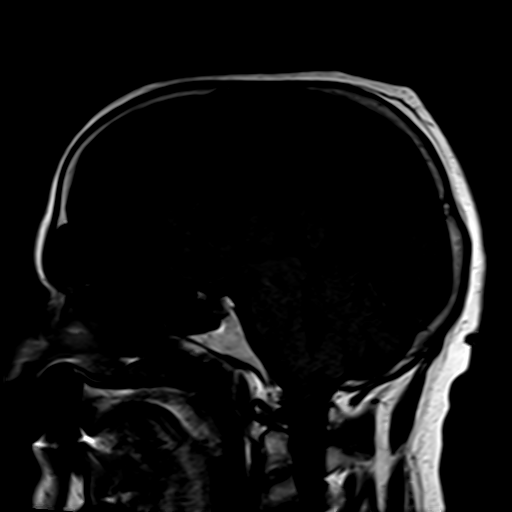
[im 24/24]
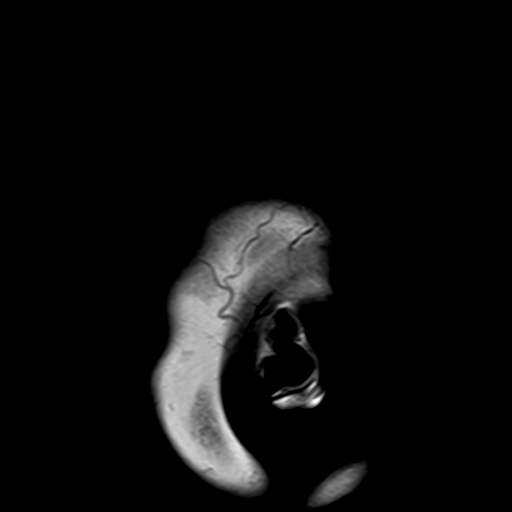

[Series 11: T1 · axial · 5.0mm · 0.90mm/px · z∈[-74,+88]mm · 3 of 28 slices shown (2 of 2)]
[im 1/28]
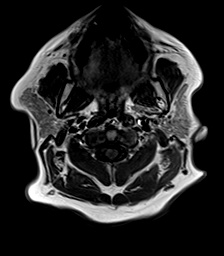
[im 14/28]
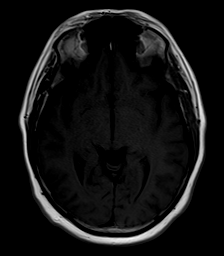
[im 28/28]
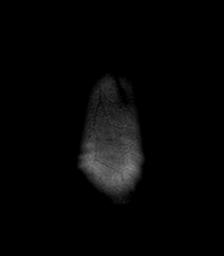

[Series 12: T2 · axial · 5.0mm · 0.86mm/px · z∈[-74,+87]mm · 3 of 28 slices shown (1 of 2)]
[im 1/28]
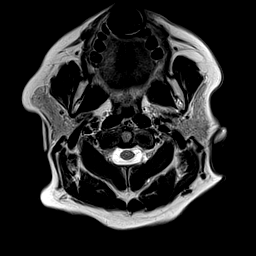
[im 14/28]
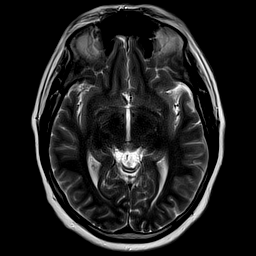
[im 28/28]
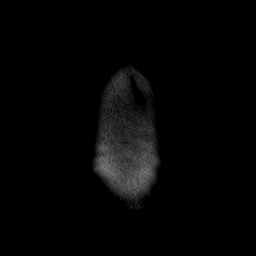

[Series 13: FLAIR · axial · 3.0mm · 0.69mm/px · z∈[-74,+87]mm · 7 of 55 slices shown]
[im 1/55]
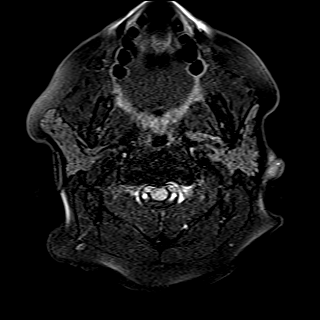
[im 10/55]
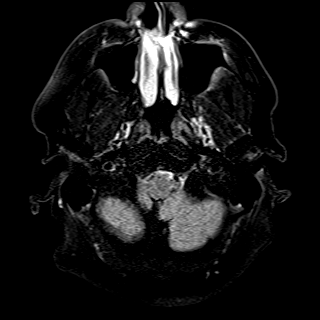
[im 19/55]
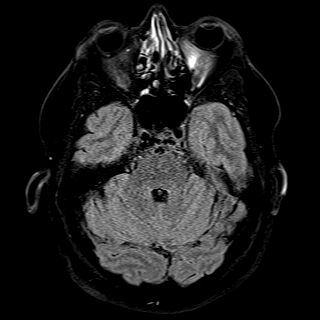
[im 28/55]
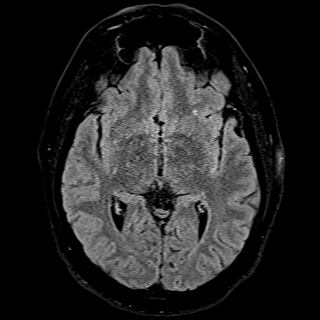
[im 37/55]
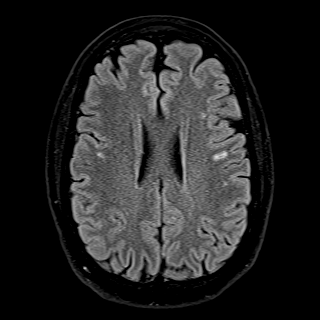
[im 46/55]
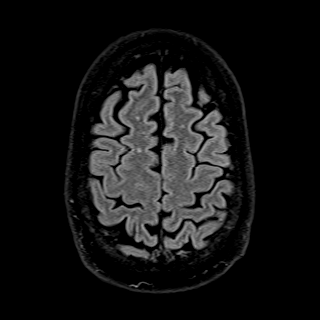
[im 55/55]
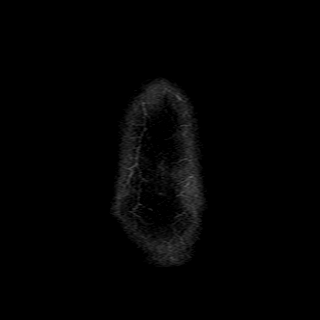

[Series 14: T2 · coronal · 5.0mm · 0.45mm/px · 4 of 31 slices shown (2 of 2)]
[im 1/31]
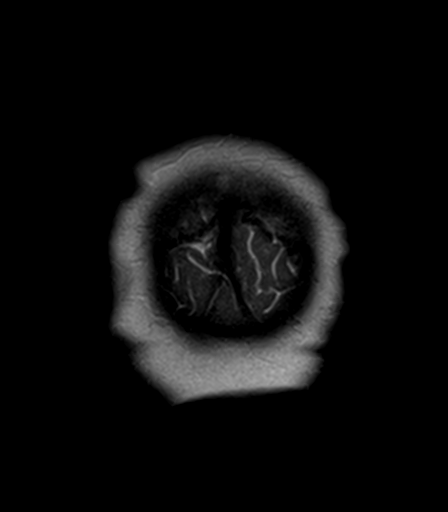
[im 11/31]
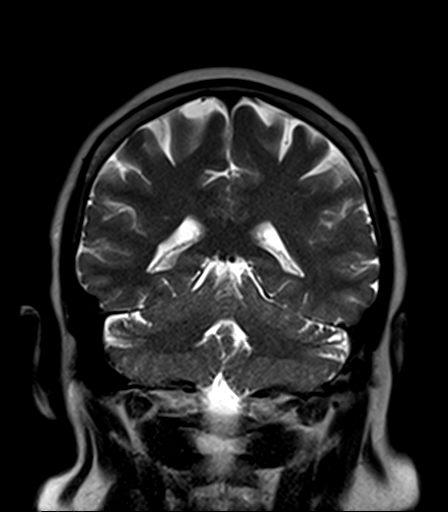
[im 21/31]
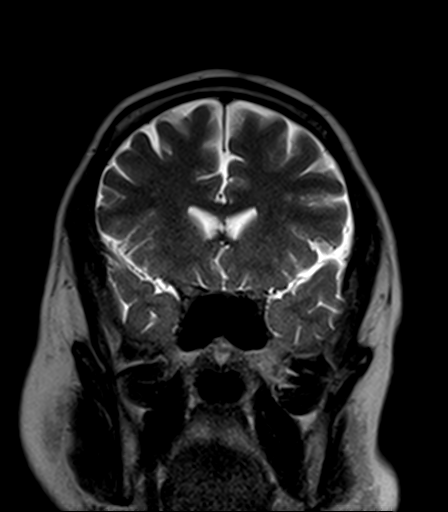
[im 31/31]
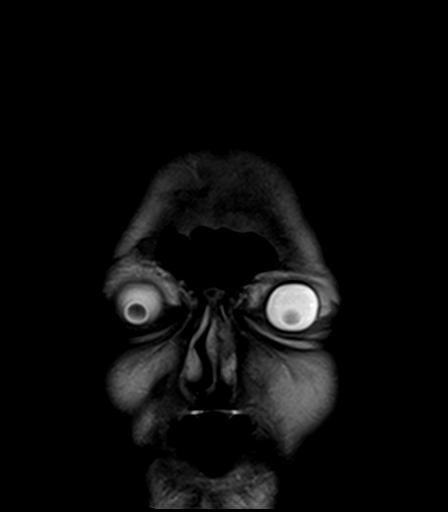

[40 of 48 positions shown; findings below may reference images not displayed]

FINDINGS: Brain: There is no acute infarction or intracranial hemorrhage.
There is no intracranial mass, mass effect, or edema. There is no
hydrocephalus or extra-axial fluid collection. Ventricles and sulci
are normal in size and configuration. Patchy small foci of T2
hyperintensity in the supratentorial white matter are nonspecific
but may reflect mild chronic microvascular ischemic changes.

Vascular: Major vessel flow voids at the skull base are preserved.

Skull and upper cervical spine: Normal marrow signal is preserved.

Sinuses/Orbits: Paranasal sinuses are aerated. Probable prior right
orbital floor injury, noting herniation of orbital fat into the
sinus antrum.

Other: Sella is unremarkable.  Mastoid air cells are clear.
IMPRESSION: No evidence of recent infarction, hemorrhage, or mass. Probable mild
chronic microvascular ischemic changes.

## 2023-06-10 IMAGING — CT CT HEAD W/O CM
4 series · 16 of 47 positions shown, 18 images · non-contrast
Comparison: CT head 10/20/2015

CLINICAL DATA: Left upper extremity numbness and facial numbness



[Series 2: head wo · axial · 0.42mm/px · z∈[-146,-26]mm · 7 of 32 slices shown, 9 images]
[im 4/32  brain]
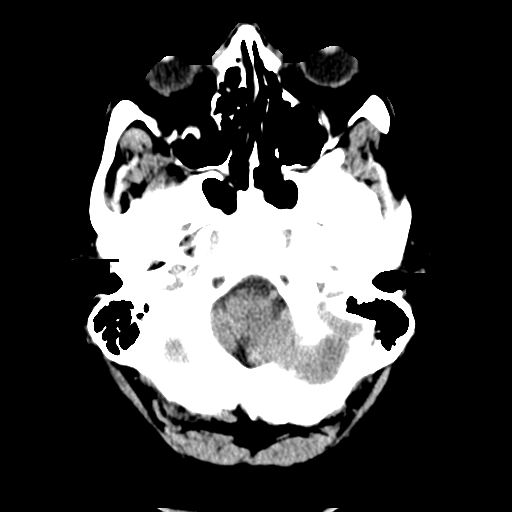
[im 4/32  bone]
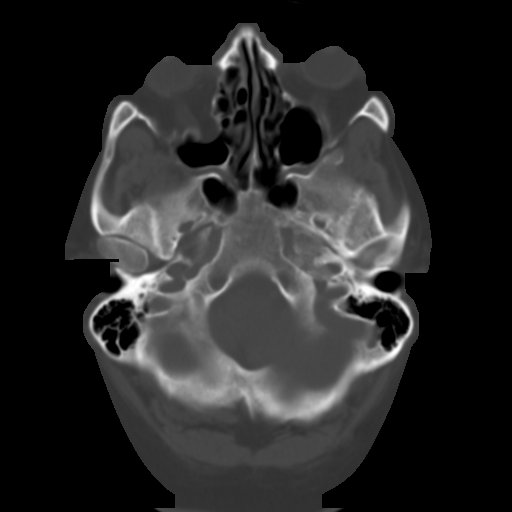
[im 8/32  brain]
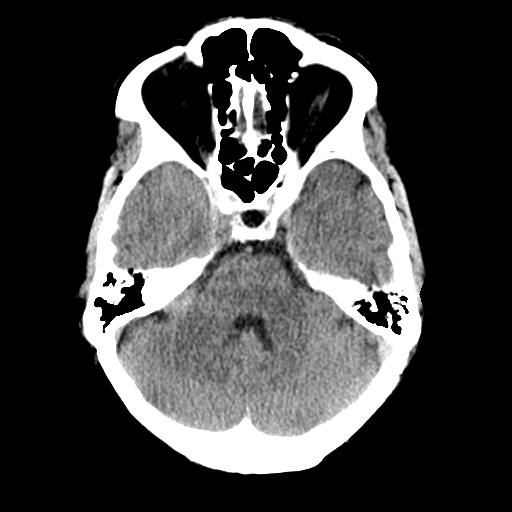
[im 12/32  brain]
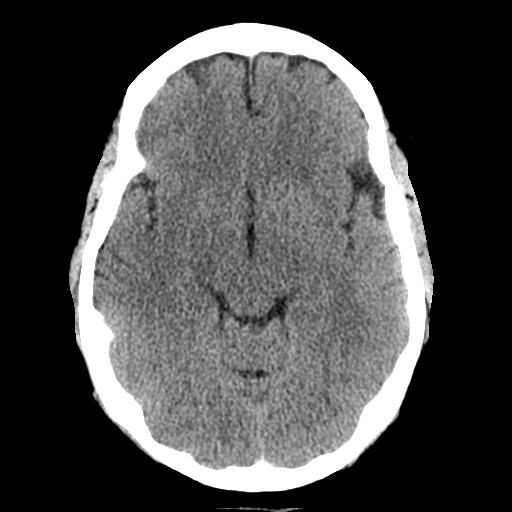
[im 16/32  brain]
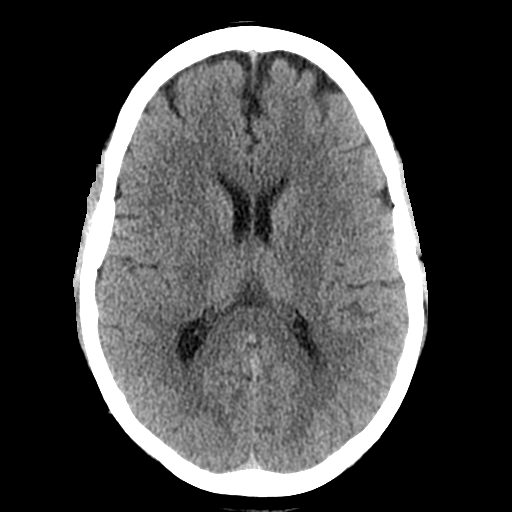
[im 20/32  brain]
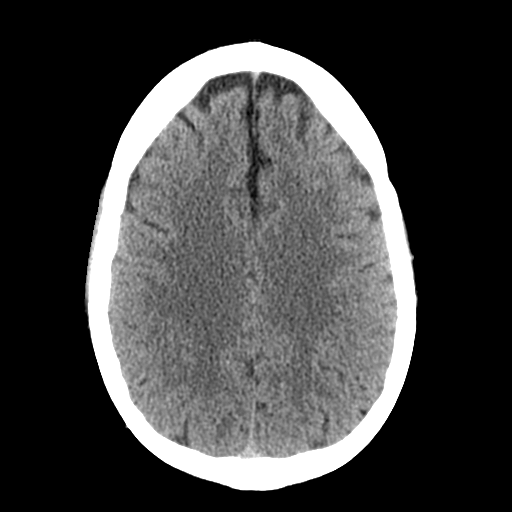
[im 20/32  bone]
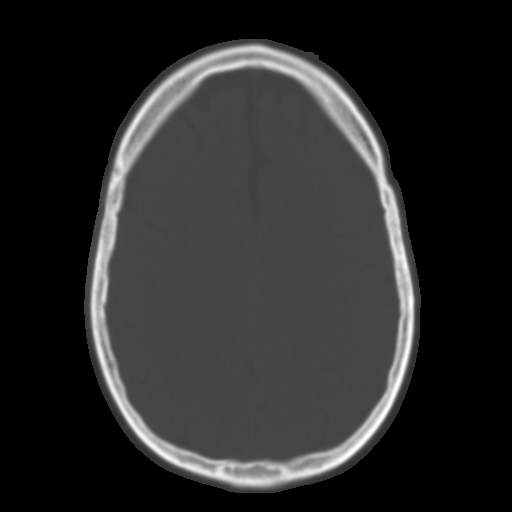
[im 24/32  brain]
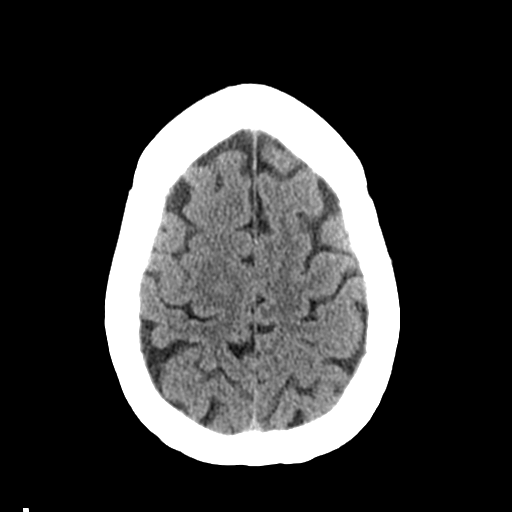
[im 28/32  brain]
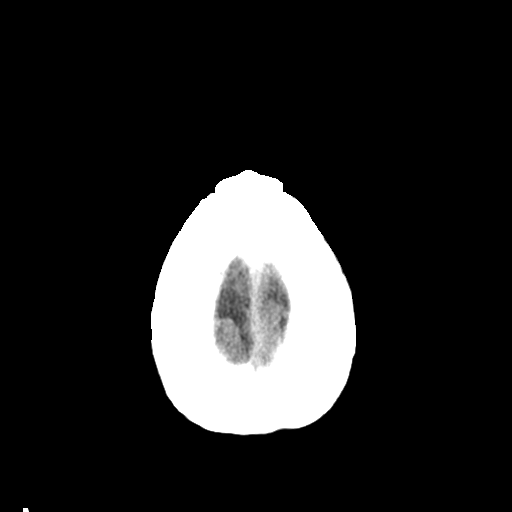

[Series 3: head bone · axial · 0.42mm/px · z∈[-147,-115]mm · 3 of 79 slices shown]
[im 8/79  bone]
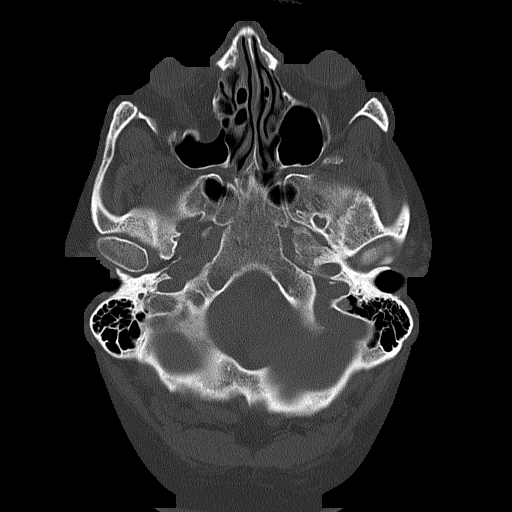
[im 16/79  bone]
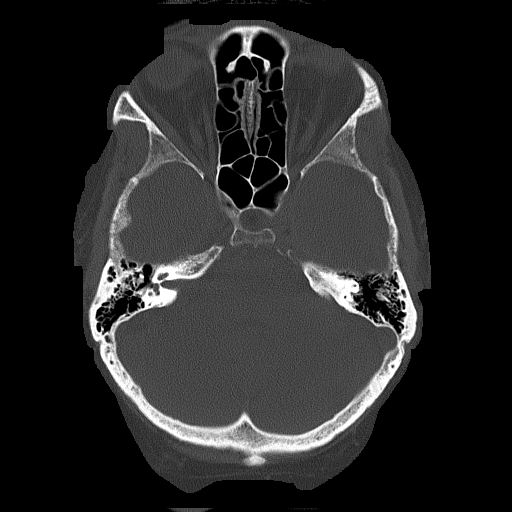
[im 24/79  bone]
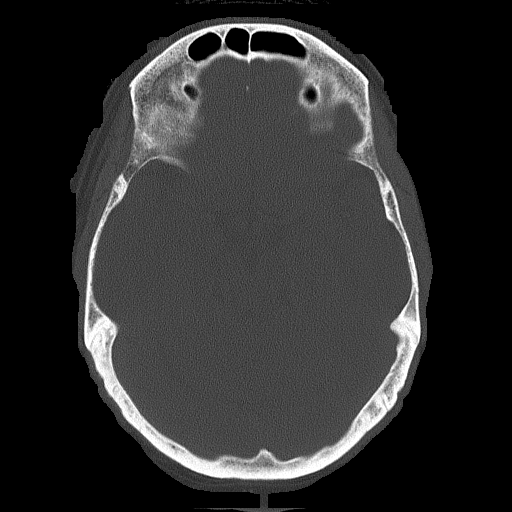

[Series 4: coronal soft tissue · coronal · 0.32mm/px · 3 of 76 slices shown]
[im 26/76  brain]
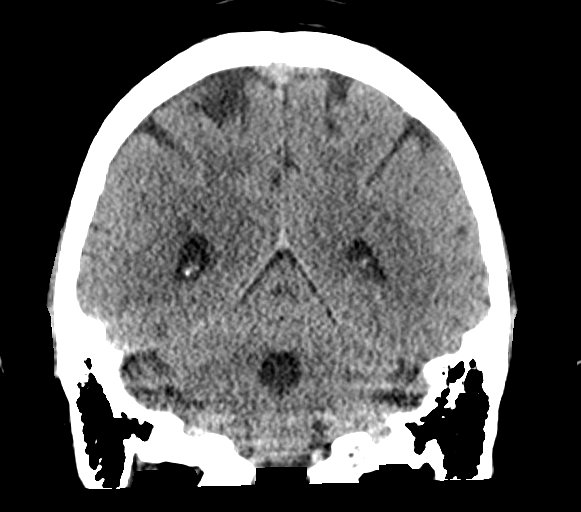
[im 34/76  brain]
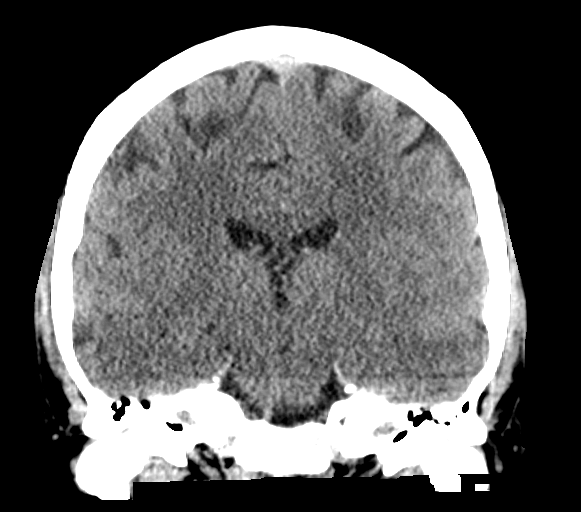
[im 42/76  brain]
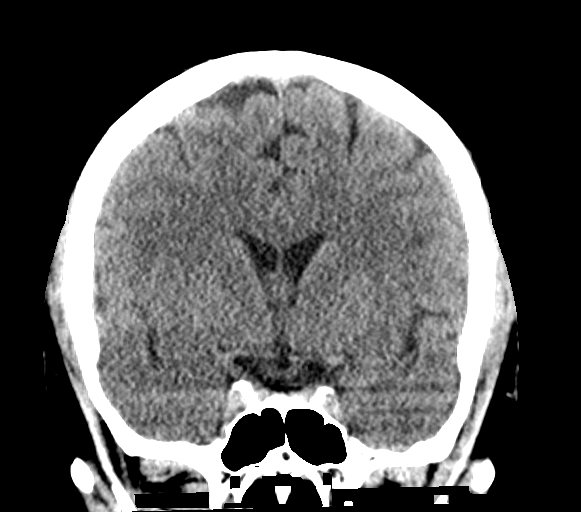

[Series 5: sagittal soft tissue · sagittal · 0.32mm/px · 3 of 62 slices shown]
[im 21/62  brain]
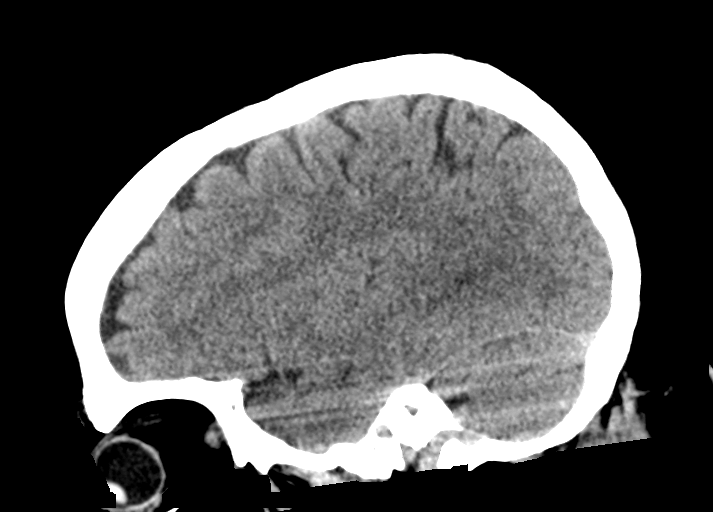
[im 31/62  brain]
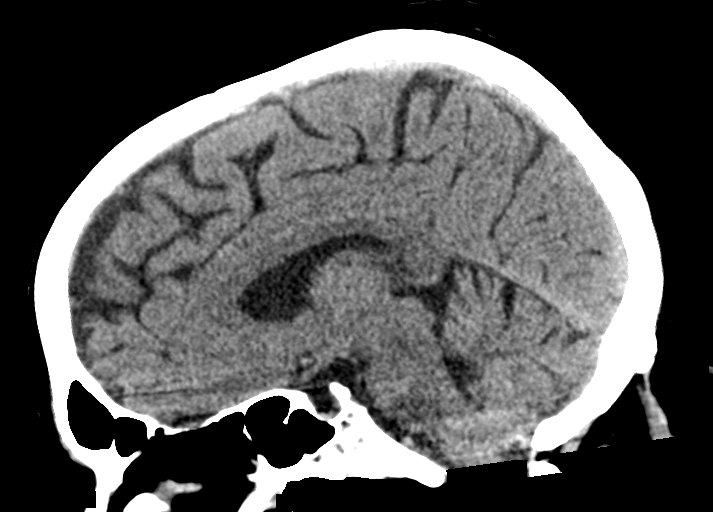
[im 41/62  brain]
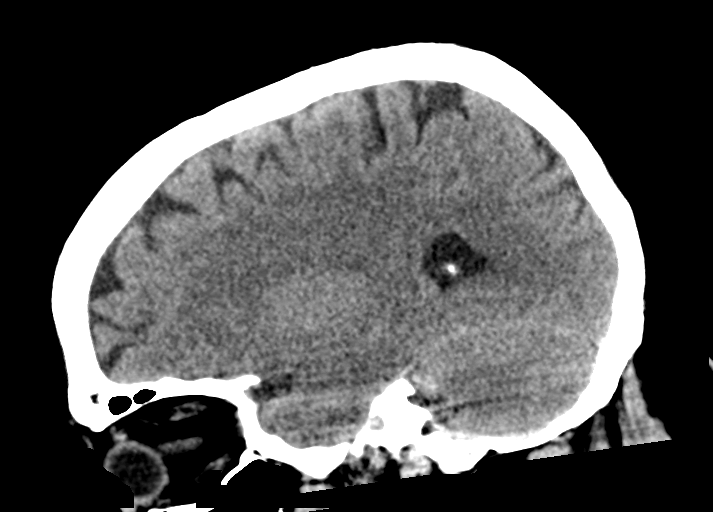

[16 of 47 positions shown; findings below may reference images not displayed]

FINDINGS: Brain: There is no evidence of acute intracranial hemorrhage,
extra-axial fluid collection, or acute infarct.

Parenchymal volume is normal. The ventricles are normal in size.
Gray-white differentiation is maintained.

There is no mass lesion.  There is no midline shift.

Vascular: No hyperdense vessel or unexpected calcification.

Skull: Normal. Negative for fracture or focal lesion.

Sinuses/Orbits: Imaged paranasal sinuses are clear. The globes and
orbits are unremarkable.

Other: None.
IMPRESSION: No acute intracranial pathology.

## 2023-10-01 ENCOUNTER — Telehealth: Payer: Self-pay

## 2023-10-01 NOTE — Telephone Encounter (Signed)
 I called patient to let him know that Dr. Marikay Alar has left our practice and ask if he would like to transfer his care to one of our providers who are accepting new patients.  Patient states he has moved out of the area and does not want to schedule a TOC.
